# Patient Record
Sex: Male | Born: 1975 | Race: White | Hispanic: No | Marital: Married | Smoking: Former smoker
Health system: Southern US, Community
[De-identification: ages and names within clinical notes are randomized; demographics above are authoritative.]

## PROBLEM LIST (undated history)

## (undated) DIAGNOSIS — F909 Attention-deficit hyperactivity disorder, unspecified type: Secondary | ICD-10-CM

## (undated) DIAGNOSIS — IMO0002 Reserved for concepts with insufficient information to code with codable children: Principal | ICD-10-CM

## (undated) DIAGNOSIS — R202 Paresthesia of skin: Secondary | ICD-10-CM

## (undated) DIAGNOSIS — I609 Nontraumatic subarachnoid hemorrhage, unspecified: Secondary | ICD-10-CM

## (undated) DIAGNOSIS — Z8639 Personal history of other endocrine, nutritional and metabolic disease: Secondary | ICD-10-CM

## (undated) DIAGNOSIS — I10 Essential (primary) hypertension: Secondary | ICD-10-CM

## (undated) DIAGNOSIS — G4730 Sleep apnea, unspecified: Secondary | ICD-10-CM

## (undated) DIAGNOSIS — D62 Acute posthemorrhagic anemia: Secondary | ICD-10-CM

## (undated) DIAGNOSIS — S270XXA Traumatic pneumothorax, initial encounter: Secondary | ICD-10-CM

## (undated) DIAGNOSIS — E669 Obesity, unspecified: Secondary | ICD-10-CM

## (undated) DIAGNOSIS — E785 Hyperlipidemia, unspecified: Secondary | ICD-10-CM

## (undated) DIAGNOSIS — R251 Tremor, unspecified: Secondary | ICD-10-CM

## (undated) DIAGNOSIS — R159 Full incontinence of feces: Secondary | ICD-10-CM

## (undated) DIAGNOSIS — Z22322 Carrier or suspected carrier of Methicillin resistant Staphylococcus aureus: Principal | ICD-10-CM

## (undated) DIAGNOSIS — L720 Epidermal cyst: Secondary | ICD-10-CM

## (undated) DIAGNOSIS — R0600 Dyspnea, unspecified: Secondary | ICD-10-CM

## (undated) DIAGNOSIS — K219 Gastro-esophageal reflux disease without esophagitis: Secondary | ICD-10-CM

## (undated) DIAGNOSIS — R5383 Other fatigue: Principal | ICD-10-CM

## (undated) DIAGNOSIS — F418 Other specified anxiety disorders: Secondary | ICD-10-CM

## (undated) DIAGNOSIS — E538 Deficiency of other specified B group vitamins: Secondary | ICD-10-CM

## (undated) DIAGNOSIS — K625 Hemorrhage of anus and rectum: Secondary | ICD-10-CM

## (undated) DIAGNOSIS — M545 Low back pain: Secondary | ICD-10-CM

## (undated) DIAGNOSIS — Z87442 Personal history of urinary calculi: Secondary | ICD-10-CM

## (undated) DIAGNOSIS — D72829 Elevated white blood cell count, unspecified: Secondary | ICD-10-CM

## (undated) DIAGNOSIS — F329 Major depressive disorder, single episode, unspecified: Secondary | ICD-10-CM

## (undated) DIAGNOSIS — R519 Headache, unspecified: Secondary | ICD-10-CM

## (undated) DIAGNOSIS — F419 Anxiety disorder, unspecified: Secondary | ICD-10-CM

## (undated) DIAGNOSIS — G4733 Obstructive sleep apnea (adult) (pediatric): Secondary | ICD-10-CM

## (undated) HISTORY — DX: Elevated white blood cell count, unspecified: D72.829

## (undated) HISTORY — DX: Obstructive sleep apnea (adult) (pediatric): G47.33

## (undated) HISTORY — DX: Nontraumatic subarachnoid hemorrhage, unspecified: I60.9

## (undated) HISTORY — DX: Essential (primary) hypertension: I10

## (undated) HISTORY — DX: Sleep apnea, unspecified: G47.30

## (undated) HISTORY — DX: Hemorrhage of anus and rectum: K62.5

## (undated) HISTORY — DX: Other specified anxiety disorders: F41.8

## (undated) HISTORY — DX: Paresthesia of skin: R20.2

## (undated) HISTORY — DX: Deficiency of other specified B group vitamins: E53.8

## (undated) HISTORY — DX: Personal history of other endocrine, nutritional and metabolic disease: Z86.39

## (undated) HISTORY — DX: Acute posthemorrhagic anemia: D62

## (undated) HISTORY — DX: Gastro-esophageal reflux disease without esophagitis: K21.9

## (undated) HISTORY — DX: Hyperlipidemia, unspecified: E78.5

## (undated) HISTORY — DX: Other fatigue: R53.83

## (undated) HISTORY — DX: Obesity, unspecified: E66.9

## (undated) HISTORY — DX: Attention-deficit hyperactivity disorder, unspecified type: F90.9

## (undated) HISTORY — PX: LAPAROSCOPIC GASTRIC SLEEVE RESECTION: SHX5895

## (undated) HISTORY — DX: Carrier or suspected carrier of methicillin resistant Staphylococcus aureus: Z22.322

## (undated) HISTORY — DX: Traumatic pneumothorax, initial encounter: S27.0XXA

## (undated) HISTORY — DX: Epidermal cyst: L72.0

## (undated) HISTORY — DX: Tremor, unspecified: R25.1

## (undated) HISTORY — DX: Low back pain: M54.5

## (undated) HISTORY — PX: OTHER SURGICAL HISTORY: SHX169

## (undated) HISTORY — DX: Reserved for concepts with insufficient information to code with codable children: IMO0002

---
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
## 2005-03-14 ENCOUNTER — Encounter: Payer: Self-pay | Admitting: Pulmonary Disease

## 2005-03-14 ENCOUNTER — Ambulatory Visit (HOSPITAL_BASED_OUTPATIENT_CLINIC_OR_DEPARTMENT_OTHER): Admission: RE | Admit: 2005-03-14 | Discharge: 2005-03-14 | Payer: Self-pay | Admitting: Family Medicine

## 2005-03-21 ENCOUNTER — Ambulatory Visit: Payer: Self-pay | Admitting: Internal Medicine

## 2010-07-08 ENCOUNTER — Emergency Department (HOSPITAL_BASED_OUTPATIENT_CLINIC_OR_DEPARTMENT_OTHER)
Admission: EM | Admit: 2010-07-08 | Discharge: 2010-07-08 | Payer: Self-pay | Source: Home / Self Care | Admitting: Emergency Medicine

## 2010-07-08 ENCOUNTER — Ambulatory Visit
Admission: RE | Admit: 2010-07-08 | Discharge: 2010-07-08 | Payer: Self-pay | Source: Home / Self Care | Attending: Family Medicine | Admitting: Family Medicine

## 2010-07-08 DIAGNOSIS — R079 Chest pain, unspecified: Secondary | ICD-10-CM | POA: Insufficient documentation

## 2010-07-08 DIAGNOSIS — L720 Epidermal cyst: Secondary | ICD-10-CM | POA: Insufficient documentation

## 2010-07-08 DIAGNOSIS — K219 Gastro-esophageal reflux disease without esophagitis: Secondary | ICD-10-CM | POA: Insufficient documentation

## 2010-07-08 DIAGNOSIS — G4733 Obstructive sleep apnea (adult) (pediatric): Secondary | ICD-10-CM | POA: Insufficient documentation

## 2010-07-08 DIAGNOSIS — F909 Attention-deficit hyperactivity disorder, unspecified type: Secondary | ICD-10-CM | POA: Insufficient documentation

## 2010-07-08 DIAGNOSIS — R259 Unspecified abnormal involuntary movements: Secondary | ICD-10-CM | POA: Insufficient documentation

## 2010-07-08 DIAGNOSIS — M542 Cervicalgia: Secondary | ICD-10-CM | POA: Insufficient documentation

## 2010-07-08 DIAGNOSIS — IMO0002 Reserved for concepts with insufficient information to code with codable children: Secondary | ICD-10-CM | POA: Insufficient documentation

## 2010-07-08 DIAGNOSIS — I10 Essential (primary) hypertension: Secondary | ICD-10-CM | POA: Insufficient documentation

## 2010-07-08 HISTORY — DX: Obstructive sleep apnea (adult) (pediatric): G47.33

## 2010-07-08 HISTORY — DX: Epidermal cyst: L72.0

## 2010-07-13 ENCOUNTER — Ambulatory Visit: Admit: 2010-07-13 | Discharge: 2010-07-13 | Payer: Self-pay | Attending: Family Medicine | Admitting: Family Medicine

## 2010-07-13 ENCOUNTER — Encounter: Payer: Self-pay | Admitting: Family Medicine

## 2010-07-13 DIAGNOSIS — J029 Acute pharyngitis, unspecified: Secondary | ICD-10-CM | POA: Insufficient documentation

## 2010-07-14 LAB — CONVERTED CEMR LAB
AST: 20 units/L (ref 0–37)
Alkaline Phosphatase: 64 units/L (ref 39–117)
BUN: 18 mg/dL (ref 6–23)
Calcium: 9.4 mg/dL (ref 8.4–10.5)
Chloride: 105 meq/L (ref 96–112)
Creatinine, Ser: 1.04 mg/dL (ref 0.40–1.50)
HCT: 42.4 % (ref 39.0–52.0)
HDL: 28 mg/dL — ABNORMAL LOW (ref >39)
Hemoglobin: 13.9 g/dL (ref 13.0–17.0)
Indirect Bilirubin: 0.5 mg/dL (ref 0.0–0.9)
LDL Cholesterol: 118 mg/dL — ABNORMAL HIGH (ref 0–99)
MCHC: 32.8 g/dL (ref 30.0–36.0)
MCV: 85.5 fL (ref 78.0–100.0)
Platelets: 369 10*3/uL (ref 150–400)
RDW: 14.5 % (ref 11.5–15.5)
Total Protein: 7.7 g/dL (ref 6.0–8.3)
Triglycerides: 262 mg/dL — ABNORMAL HIGH (ref <150)

## 2010-07-16 NOTE — Assessment & Plan Note (Signed)
Summary: middle to lower back pain/vfw   Vital Signs:  Patient profile:   35 year old male Height:      73 inches (185.42 cm) Weight:      384 pounds (174.55 kg) BMI:     50.85 O2 Sat:      98 % on Room air Temp:     97.8 degrees F (36.56 degrees C) oral Pulse rate:   70 / minute BP sitting:   145 / 80  (right arm) Cuff size:   large  Vitals Entered By: Josph Macho RMA (July 08, 2010 12:53 PM)  O2 Flow:  Room air  Serial Vital Signs/Assessments:  Time      Position  BP       Pulse  Resp  Temp     By                     140/90                         Danise Edge MD  CC: Right Middle to lower back- pain travels down front right leg  X2 days/ CF Is Patient Diabetic? No   History of Present Illness: Patient is a 35 yo Caucasian male in today for an acute new patient visit with c/o 2 day history of persitent low back pain. He is accompanied by his wife. He reports he was jsut bending over to put his dogs water dish down when his low back locked up and he reports a sharp pain nearly took him to his knees. He denies any incontinence or abdominal pain. No numbness/tingling/weakness in legs. Pain is much more significant on right than left and he denies any history of similar injury or any recent trauma.   Upon ROS it turns out he has multiple other problems but has not had a check up or PMD for aobut 5 years. He acknowledges trouble with intermittent CP for several months. He describes the pain as sometimes pressure and some times sharp. Sometimes lasts minutes and sometimes lasts days. Lately he has been noticiing some radiation of the discomfort to his left shoulder and jaw line. He denies any worsening of diaphoresis or SOB. Does occasionally note some fluttering. He was diagnosed with Sleep apnea rougly years ago and treated with CPAP. Unfortunately he stopped using it about 4 years ago when their youngest son was born. His wife confirms he continues to snore and she witnesses  apneic episodes. Patient denies any recent illness/congestion/fevers/chills/malaise/GI or GU c/o. Does note his bowels only move every 2-3 days but no bleeding or tarry stool noted. He is concerning about an enlarging cyst on the back of his neck, he notes several years ago when it started his PMD numbed and then lanced and packed the area. A significant amount of pus was expelled and while no further pus has developed the lesion continues to slowly enlarge and is bother some. No redness, heat noted  Preventive Screening-Counseling & Management  Alcohol-Tobacco     Smoking Status: never  Caffeine-Diet-Exercise     Does Patient Exercise: no      Drug Use:  no.    Current Medications (verified): 1)  Aleve 220 Mg Tabs (Naproxen Sodium) .... 2 Q4-6 Hrs As Needed For Pain  Allergies (verified): No Known Drug Allergies  Family History: Father: 53, Obese, disassociative fugue, cleft palate, HTN, hyperlipidemia, gout, arthritis Mother: 11, cardiomyopathy, HTN, overweight Siblings:  Sister: 32,  A[employer] MGM: 19, CAD s/p multiple MIs first one in late 41s, CHF MGF: deceased in early 2s, smoker, spinal cancer, essential tremors PGM: deceased late 50s, leukemia, smoker PGF: 46, afib at 3, HTN, pacer/defibrillator Children: Daughter: 60yo, ADHD Son: 69yo, A[employer]  Social History: Occupation: works for [employer] Married Never Smoked Drug use-no Regular exercise-no 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 Obese Head:  Normocephalic and atraumatic without obvious abnormalities. No apparent alopecia or balding. Eyes:  No corneal or conjunctival inflammation noted. EOMI. Perrla.  Ears:  External ear exam shows no significant lesions or deformities.   Otoscopic examination reveals clear canals, tympanic membranes are intact bilaterally without bulging, retraction, inflammation or discharge. Hearing is grossly normal bilaterally. Nose:  External nasal examination shows no deformity or inflammation. Nasal mucosa are pink and moist without lesions or exudates. Mouth:  Oral mucosa and oropharynx without lesions or exudates.  Teeth in good repair. Neck:  No deformities or tenderness noted. Firm, NT slightly mobile lesion noted over cervical spine roughly 2 x 4 cm. No erythema or fluctuance Lungs:  Normal respiratory effort, chest expands symmetrically. Lungs are clear to auscultation, no crackles or wheezes. Heart:  Normal rate and regular rhythm. S1 and S2 normal without gallop, murmur, click, rub or other extra sounds. Abdomen:  Bowel sounds positive,abdomen soft and non-tender without masses, organomegaly or hernias noted. Msk:  No deformity or scoliosis noted of thoracic or lumbar spine.  Spasm noted in muslces fromright sacroiliac crest to lower posterior ribs, tender to palp and with limited movements. Pulses:  R and L carotid, dorsalis pedis and posterior tibial pulses are full and equal bilaterally Extremities:  No clubbing, cyanosis, edema, or deformity noted with normal full range of motion of all joints.   Neurologic:  No cranial nerve deficits noted. Station and gait are normal. Plantar reflexes are down-going bilaterally. DTRs are symmetrical throughout. Sensory, motor and coordinative functions appear intact. Skin:  Intact without suspicious lesions or rashes Cervical Nodes:  No lymphadenopathy noted Psych:  Cognition and judgment appear intact. Alert and cooperative with normal attention span and concentration. No apparent delusions, illusions, hallucinations   Impression & Recommendations:  Problem # 1:  BACK PAIN, LUMBAR, WITH RADICULOPATHY (ICD-724.4)  The following medications were removed from the medication list:    Aleve 220  Mg Tabs (Naproxen sodium) .Marland Kitchen... 2 q4-6 hrs as needed for pain His updated medication list for this problem includes:    Naprosyn 375 Mg Tabs (Naproxen) .Marland Kitchen... 1 tab by mouth two times a day as needed pain with food    Cyclobenzaprine Hcl 10 Mg Tabs (Cyclobenzaprine hcl) .Marland Kitchen... 1 tab by mouth two times a day as needed pain, can cause sedation    Bayer Aspirin Ec Low Dose 81 Mg Tbec (Aspirin) .Marland Kitchen... 1 tab by mouth daily Patient in with a 2 day history of persistent and at times severe lower back pain, r>>l. No previous history of same.  Problem # 2:  CHEST PAIN (ICD-786.50)  Orders: Cardiology Referral (Cardiology) T-Hepatic Function [phone]) T-Lipid Profile [phone]) Has been struggling with atypical CP sometimes lasting days off and on for months. Does note some recent radiation to left shoulder and jaw. Does occasionally note some fluttering also. Denies any increase in SOB, diaphoresis. With h/o apnea and obesity as well as  FH of MGM with CAD in 30s will refer for cardiology eval.  Started on ECASA 81mg  daily and given SL NTG to keep on his person at all times and advised to seek immediate care if CP does not resolve with use. 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 Consider Beta Blockade, likely Benign Essential Tremor given family history  Problem # 5:  SEBACEOUS CYST, NECK (ICD-706.2) Patient it was drained years ago and continues to grow slowly. Will consider referral for excison once his cardiac and pulmonary status have been evaluated  Problem # 6:  SLEEP APNEA  (ICD-780.57)  Orders: Cardiology Referral (Cardiology) Pulmonary Referral (Pulmonary) T-CBC No Diff ([id number]) Diagnosed with sleep apnea and using CPAP roughly 6 years ago but unfortunately stopped using the CPAP 4 years ago when his youngest child was born. His wife reports persistent snoring and witnesses apneic episodes  Problem # 7:  GERD (ICD-530.81)  His updated medication list for this problem includes:    Ranitidine Hcl 150 Mg Caps (Ranitidine hcl) .Marland Kitchen... 1 tab by mouth daily Avoid offending foods, reduce size of meals and do not eat too close to bedtime.  Complete Medication List: 1)  Naprosyn 375 Mg Tabs (Naproxen) .Marland Kitchen.. 1 tab by mouth two times a day as needed pain with food 2)  Cyclobenzaprine Hcl 10 Mg Tabs (Cyclobenzaprine hcl) .Marland Kitchen.. 1 tab by mouth two times a day as needed pain, can cause sedation 3)  Nitrostat 0.4 Mg Subl (Nitroglycerin) .Marland Kitchen.. 1 tab sl as needed cp, may repeat q 5 minutes x 3 doses, if no resolution to er for eval 4)  Bayer Aspirin Ec Low Dose 81 Mg Tbec (Aspirin) .Marland Kitchen.. 1 tab by mouth daily 5)  Ranitidine Hcl 150 Mg Caps (Ranitidine hcl) .Marland Kitchen.. 1 tab by mouth daily  Other Orders: Flu Vaccine 6-35 months (09811) Admin 1st Vaccine (91478) T-Basic Metabolic Panel [phone]) T-TSH [id number])  Patient Instructions: 1)  Please schedule a follow-up appointment in 2 to 4 weeks.  2)  Avoid foods high in acid(tomatoes, citrus juices,spicy foods).Avoid eating within two hours of lying down or before exercising. Do not over eat: try smaller more frequent meals. Elevate head of bed twelve inches when sleeping.  3)  Take 650 - 1000 mg of tylenol every 6 hours as needed for relief of pain or comfort of fever. Avoid taking more than 3000 mg in a 24 hour period( can cause liver damage in higher doses).  4)  Most patients (90%) with low back pain will improve with time ( 2-6 weeks). Keep active but avoid activities that are painful. Apply moist heat and/or ice to  lower back several times a day.  5)  May apply moist heat then gentle stretching two times a day for low back. 6)  No new strenuous work out routine until cardiac evaluation complete Prescriptions: RANITIDINE HCL 150 MG CAPS (RANITIDINE HCL) 1 tab by mouth daily  #30 x 2   Entered and Authorized by:   Danise Edge MD   Signed by:   Danise Edge MD on 07/08/2010   Method used:   Electronically to  [facility] [address] (retail)       [address]       [place], [place]  [address]       Ph: [phone]       Fax: [phone]   RxID:   [id number] NITROSTAT 0.4 MG SUBL (NITROGLYCERIN) 1 tab sl as needed CP, may repeat q 5 minutes x 3 doses, if no resolution to ER for eval  #25 x 1   Entered and Authorized by:   Danise Edge MD   Signed by:   Danise Edge MD on 07/08/2010   Method used:   Electronically to        [facility] [address] (retail)       [address]       [place], [place]  [address]       Ph: [phone]       Fax: [phone]   RxID:   [id number] CYCLOBENZAPRINE HCL 10 MG TABS (CYCLOBENZAPRINE HCL) 1 tab by mouth two times a day as needed pain, can cause sedation  #40 x 1   Entered and Authorized by:   Danise Edge MD   Signed by:   Danise Edge MD on 07/08/2010   Method used:   Electronically to        [facility] [address] (retail)       [address]       [place], [place]  [address]       Ph: [phone]       Fax: [phone]   RxID:   [id number] NAPROSYN 375 MG TABS (NAPROXEN) 1 tab by mouth two times a day as needed pain with food  #40 x 1   Entered and Authorized by:   Danise Edge MD   Signed by:   Danise Edge MD on 07/08/2010   Method used:   Electronically to        [facility] [address] (retail)       [address]       [place], [place]  [address]       Ph: [phone]       Fax: [phone]   RxID:   [id number]    Orders Added: 1)  Flu Vaccine 6-35 months  [[address]] 2)  Admin 1st Vaccine [90471] 3)  Cardiology Referral [Cardiology] 4)  Pulmonary Referral [Pulmonary] 5)  T-Basic Metabolic Panel [[id number]] 6)  T-Hepatic Function [80076-22960] 7)  T-Lipid Profile [80061-22930] 8)  T-CBC No Diff [[address]] 9)  T-TSH [[id number]] 10)  New Patient Level IV [51025]   Immunizations Administered:  Influenza Vaccine # 1:    Vaccine Type: Fluvax 6-77mos    Site: left deltoid    Mfr: Sanofi Pasteur    Dose: 0.5 ml    Route: IM    Given by: Josph Macho RMA    Exp. Date: 12/12/2010    Lot #: [id number]    VIS given: 01/06/10 version given July 08, 2010.   Immunizations Administered:  Influenza Vaccine # 1:    Vaccine Type: Fluvax 6-12mos    Site: left deltoid    Mfr: Sanofi Pasteur    Dose: 0.5 ml    Route: IM    Given by: Josph Macho RMA    Exp. Date: 12/12/2010  Lot #: [id number]    VIS given: 01/06/10 version given July 08, 2010.

## 2010-07-22 NOTE — Assessment & Plan Note (Signed)
Summary: SORE THROAT, CONGESTION//SP   Vital Signs:  Patient profile:   35 year old male Height:      73 inches Weight:      376.50 pounds BMI:     49.85 Temp:     96.4 degrees F temporal Pulse rate:   84 / minute BP sitting:   128 / 93  (right arm) Cuff size:   large  Vitals Entered By: Francee Piccolo CMA Duncan Dull) (July 13, 2010 9:02 AM) CC: ear ache, sore throat, feels like drainage...pt does not feel any chest congestion Is Patient Diabetic? No   History of Present Illness: 35 y/o WM, onset of ST--mainly left sided-- about 4d ago, with slight nasal mucous/PND but no cough, fever, or rash.  Also has left ear pain.  Dealing with acute LBP lately, went to [facility] shortly after being seen here for LBP several days ago, was given a shot of something per his report, also rx for vicodin. Missed work 07/10/09 due to malaise with his ST. ROS: no joint aches except recent LBP.  No muscle aches except LBP. No eye swelling or drainage.  No focal oral lesions, no neck pain.  Current Medications (verified): 1)  Naprosyn 375 Mg Tabs (Naproxen) .Marland Kitchen.. 1 Tab By Mouth Two Times A Day As Needed Pain With Food 2)  Cyclobenzaprine Hcl 10 Mg Tabs (Cyclobenzaprine Hcl) .Marland Kitchen.. 1 Tab By Mouth Two Times A Day As Needed Pain, Can Cause Sedation 3)  Nitrostat 0.4 Mg Subl (Nitroglycerin) .Marland Kitchen.. 1 Tab Sl As Needed Cp, May Repeat Q 5 Minutes X 3 Doses, If No Resolution To Er For Eval 4)  Bayer Aspirin Ec Low Dose 81 Mg Tbec (Aspirin) .Marland Kitchen.. 1 Tab By Mouth Daily 5)  Ranitidine Hcl 150 Mg Caps (Ranitidine Hcl) .Marland Kitchen.. 1 Tab By Mouth Daily 6)  Aleve 220 Mg Tabs (Naproxen Sodium) .... Take 2 Tablets  Allergies (verified): No Known Drug Allergies  Past History:  Past Medical History: GERD Adult ADD OSA Morbid obesity  Past Surgical History: NONE  Social History: Reviewed history from 07/08/2010 and no changes required. 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 Gen: alert, NAD, NONTOXIC. HEENT: eyes without injection, drainage, or swelling.  Ears: EACs clear, TMs with normal light reflex and landmarks.  Nose: some dried, crusty exudate adherent to some mildly injected mucosa.  No purulent d/c.  No paranasal sinus TTP.  No facial swelling.  Throat and mouth without focal lesion.  No pharyngial swelling, erythema, or exudate.   Neck: supple, no LAD.  LUNGS: CTA bilat, nonlabored resps.  CV: RRR, no m/r/g.      Impression & Recommendations:  Problem # 1:  PHARYNGITIS, VIRAL (ICD-462) Assessment New Rapid strep negative today.  His illness is definitely more consistent with viral URI/pharyngitis, with referred left ear pain. Self limited nature of illness discussed.  Warning signs discussed. Symptomatic care discussed, note given for missed day of work 07/10/10.  His updated medication list for this problem includes:    Naprosyn 375 Mg Tabs (Naproxen) .Marland Kitchen... 1 tab by mouth two times a day as needed pain with food    Bayer Aspirin Ec Low Dose 81 Mg Tbec (Aspirin) .Marland Kitchen... 1 tab by mouth daily    Aleve 220 Mg Tabs (Naproxen sodium) .Marland Kitchen... Take 2  tablets  Orders: Rapid Strep (04540)  Complete Medication List: 1)  Naprosyn 375 Mg Tabs (Naproxen) .Marland Kitchen.. 1 tab by mouth two times a day as needed pain with food 2)  Cyclobenzaprine Hcl 10 Mg Tabs (Cyclobenzaprine hcl) .Marland Kitchen.. 1 tab by mouth two times a day as needed pain, can cause sedation 3)  Nitrostat 0.4 Mg Subl (Nitroglycerin) .Marland Kitchen.. 1 tab sl as needed cp, may repeat q 5 minutes x 3 doses, if no resolution to er for eval 4)  Bayer Aspirin Ec Low Dose 81 Mg Tbec (Aspirin) .Marland Kitchen.. 1 tab by mouth daily 5)  Ranitidine Hcl 150 Mg Caps (Ranitidine hcl) .Marland Kitchen.. 1 tab by mouth daily 6)  Aleve 220 Mg Tabs (Naproxen sodium) .... Take 2 tablets  Patient Instructions: 1)   Please schedule a follow-up appointment as needed.    Orders Added: 1)  Est. Patient Level III [98119] 2)  Rapid Strep [[address]782]

## 2010-07-24 ENCOUNTER — Encounter: Payer: Self-pay | Admitting: *Deleted

## 2010-07-29 ENCOUNTER — Encounter: Payer: Self-pay | Admitting: Cardiology

## 2010-07-29 ENCOUNTER — Institutional Professional Consult (permissible substitution) (INDEPENDENT_AMBULATORY_CARE_PROVIDER_SITE_OTHER): Payer: Commercial Indemnity | Admitting: Cardiology

## 2010-07-29 DIAGNOSIS — R0989 Other specified symptoms and signs involving the circulatory and respiratory systems: Secondary | ICD-10-CM

## 2010-07-29 DIAGNOSIS — R0602 Shortness of breath: Secondary | ICD-10-CM | POA: Insufficient documentation

## 2010-07-29 DIAGNOSIS — R072 Precordial pain: Secondary | ICD-10-CM

## 2010-07-30 ENCOUNTER — Institutional Professional Consult (permissible substitution) (INDEPENDENT_AMBULATORY_CARE_PROVIDER_SITE_OTHER): Payer: Commercial Indemnity | Admitting: Pulmonary Disease

## 2010-07-30 ENCOUNTER — Encounter: Payer: Self-pay | Admitting: Pulmonary Disease

## 2010-07-30 DIAGNOSIS — G4730 Sleep apnea, unspecified: Secondary | ICD-10-CM

## 2010-08-05 NOTE — Assessment & Plan Note (Signed)
Summary: chest pains/ pt have [employer]/per Diane [phone] notes in EMR-mb   Visit Type:  Initial Consult Primary Provider:  Danise Edge MD  CC:  Chest pains and High blood pressure.  History of Present Illness: 35 year old male with no prior cardiac history for evaluation of chest pain. Patient has had intermittent chest pain for approximately one year. The pain is substernal and described as an ache. There is an occasional sharp shooting pain as well. The pain does not radiate. There is no associated nausea, vomiting, diaphoresis there is occasional mild dyspnea. The pain is not pleuritic or positional. It occurs either with exertion or at rest. It lasts hours at a time and resolved spontaneously. It can increase with stress. He also has some dyspnea on exertion but there is no orthopnea, PND or pedal edema. Because of the above we were asked to further evaluate.  Current Medications (verified): 1)  Naprosyn 375 Mg Tabs (Naproxen) .Marland Kitchen.. 1 Tab By Mouth Two Times A Day As Needed Pain With Food 2)  Cyclobenzaprine Hcl 10 Mg Tabs (Cyclobenzaprine Hcl) .Marland Kitchen.. 1 Tab By Mouth Two Times A Day As Needed Pain, Can Cause Sedation 3)  Nitrostat 0.4 Mg Subl (Nitroglycerin) .Marland Kitchen.. 1 Tab Sl As Needed Cp, May Repeat Q 5 Minutes X 3 Doses, If No Resolution To Er For Eval 4)  Bayer Aspirin Ec Low Dose 81 Mg Tbec (Aspirin) .Marland Kitchen.. 1 Tab By Mouth Daily 5)  Ranitidine Hcl 150 Mg Caps (Ranitidine Hcl) .Marland Kitchen.. 1 Tab By Mouth Daily 6)  Aleve 220 Mg Tabs (Naproxen Sodium) .... Take 2 Tablets As Needed 7)  Levothyroxine Sodium 25 Mcg Tabs (Levothyroxine Sodium) .... Once Daily 8)  Fish Oil 1000 Mg Caps (Omega-3 Fatty Acids) .... Take 1 Capsule By Mouth Two Times A Day  Allergies (verified): No Known Drug Allergies  Past History:  Past Medical History: HYPERTENSION HYPERLIPIDEMIA HYPOTHYROID ATTENTION DEFICIT HYPERACTIVITY DISORDER, ADULT  GERD  IDIOPATHIC TREMOR  MORBID OBESITY BACK PAIN, LUMBAR, WITH  RADICULOPATHY OSA  Past Surgical History: Reviewed history from 07/13/2010 and no changes required. NONE  Family History: Reviewed history from 07/08/2010 and no changes required. Father: 11, Obese, disassociative fugue, cleft palate, HTN, hyperlipidemia, gout, arthritis Mother: 12, cardiomyopathy, HTN, overweight Siblings:  Sister: 40, [employer] MGM: 57, CAD s/p multiple MIs first one in late 57s, CHF MGF: deceased in early 8s, smoker, spinal cancer, essential tremors PGM: deceased late 51s, leukemia, smoker PGF: 79, afib at 50, HTN, pacer/defibrillator Children: Daughter: 84yo, ADHD Son: 26yo, [employer]  Social History: Reviewed history from 07/08/2010 and no changes required. 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 Remaining systems are negative.   Vital Signs:  Patient profile:   35 year old male Height:      73 inches Weight:      377.50 pounds BMI:     49.99 Pulse rate:   72 / minute Pulse rhythm:   regular Resp:     18 per minute BP sitting:   118 / 80  (left arm) Cuff size:   large  Vitals Entered By: Vikki Ports (July 29, 2010 8:46 AM)  Physical Exam  General:  Well developed/obese in NAD Skin warm/dry Patient not depressed No peripheral clubbing  Back-normal HEENT-normal/normal eyelids Neck supple/normal carotid upstroke bilaterally; no bruits; no JVD; no thyromegaly chest - CTA/ normal expansion CV - RRR/normal S1 and S2; no murmurs, rubs or gallops;  PMI nondisplaced Abdomen -Difficult due to obesity; NT/ND, no HSM, no mass, + bowel sounds, no bruit 2+ femoral pulses, no bruits Ext-no edema, chords, 2+  DP Neuro-grossly nonfocal     EKG  Procedure date:  07/29/2010  Findings:      Sinus Rhythm with No ST Changes.  Impression & Recommendations:  Problem # 1:  CHEST PAIN (ICD-786.50) Symptoms atypical. Schedule treadmill for risk stratification. His updated medication list for this problem includes:    Nitrostat 0.4 Mg Subl (Nitroglycerin) .Marland Kitchen... 1 tab sl as needed cp, may repeat q 5 minutes x 3 doses, if no resolution to er for eval    Bayer Aspirin Ec Low Dose 81 Mg Tbec (Aspirin) .Marland Kitchen... 1 tab by mouth daily  His updated medication list for this problem includes:    Nitrostat 0.4 Mg Subl (Nitroglycerin) .Marland Kitchen... 1 tab sl as needed cp, may repeat q 5 minutes x 3 doses, if no resolution to er for eval    Bayer Aspirin Ec Low Dose 81 Mg Tbec (Aspirin) .Marland Kitchen... 1 tab by mouth daily  Orders: Treadmill (Treadmill) Echocardiogram (Echo)  Problem # 2:  MORBID OBESITY (ICD-278.01) Pt given information for nutrition referral. Orders: Nutrition Referral (Nutrition)  Problem # 3:  DYSPNEA (ICD-786.05) Schedule echocardiogram to quantify LV function. His updated medication list for this problem includes:    Bayer Aspirin Ec Low Dose 81 Mg Tbec (Aspirin) .Marland Kitchen... 1 tab by mouth daily  Problem # 4:  ELEVATED BP READING WITHOUT DX HYPERTENSION (ICD-796.2) Blood pressure normal today.  Problem # 5:  SLEEP APNEA (ICD-780.57)  Patient Instructions: 1)  Your physician has requested that you have an echocardiogram.  Echocardiography is a painless test that uses sound waves to create images of your heart. It provides your doctor with information about the size and shape of your heart and how well your heart's chambers and valves are working.  This procedure takes approximately one hour. There are no restrictions for this procedure. 2)  Your physician has requested that you have an exercise tolerance test.  For further information please visit [email or web address]  Please also follow instruction sheet,  as given.

## 2010-08-06 ENCOUNTER — Telehealth: Payer: Self-pay | Admitting: Pulmonary Disease

## 2010-08-11 ENCOUNTER — Encounter (INDEPENDENT_AMBULATORY_CARE_PROVIDER_SITE_OTHER): Payer: Commercial Indemnity | Admitting: Physician Assistant

## 2010-08-11 ENCOUNTER — Encounter: Payer: Self-pay | Admitting: Physician Assistant

## 2010-08-11 ENCOUNTER — Ambulatory Visit (HOSPITAL_COMMUNITY): Payer: Commercial Indemnity | Attending: Cardiology

## 2010-08-11 DIAGNOSIS — R079 Chest pain, unspecified: Secondary | ICD-10-CM

## 2010-08-11 DIAGNOSIS — R0989 Other specified symptoms and signs involving the circulatory and respiratory systems: Secondary | ICD-10-CM | POA: Insufficient documentation

## 2010-08-11 DIAGNOSIS — R0609 Other forms of dyspnea: Secondary | ICD-10-CM | POA: Insufficient documentation

## 2010-08-11 NOTE — Progress Notes (Signed)
Summary: [facility] calling re: cpap  Phone Note From Other Clinic   Caller: betsy w/ [facility] Call For: alva Summary of Call: caller states that per pt request she is cancelling the order for cpap auto since pt is going to use another DME (she just spoke to pt and was told that rhonda was taking care of this for him). betsy's # [phone] Initial call taken by: Tivis Ringer, CNA,  August 06, 2010 1:32 PM  Follow-up for Phone Call        Called and spoke with Christoper Allegra and advised once again, that a D/C order was faxed to [facility] branch per pt request yesterday. Then the manager of this branch, Okey Regal, called yesterday to verifiy that pt was getting service by another in network provider. Libby spoke with Okey Regal and advised her that the pt had decided to go to another DME company (with in network benefits). Advised that this was the third attempt to D/C this order with [facility]. She stated that she would make a  note of this and would not call again. Rhonda Cobb  August 06, 2010 1:47 PM

## 2010-08-11 NOTE — Assessment & Plan Note (Signed)
Summary: sleep consult//sh   Visit Type:  Initial Consult Primary Provider/Referring Provider:  Danise Edge MD  CC:   sleep apnea--snoring--very restless and tired during the day---has used cpap in the past but stopped --has had sleep study done back in 2007? at [facility] long.  History of Present Illness: 35/M, obese for evaluation & management  of obstructive sleep apnea  PSG in 2006 , wt 332 lbs showed AHI of 72/h , nadir desatn 76%corrected by 14 cm , med fullf ace mask Used it for a few weeks, stopped due to mask leak, forehead indentation 'I could see the benefits' Now c/o excessive daytime somnolence & snoring Epworth Sleepiness Score 12/24 side & stomach sleeper , clearly worse on his back oob at 0600, tired & groggy, an hour before he gets going 60 oz soda / day, has nutrition referral , Has gained 50 lbs over last 5 yrs There is no history suggestive of cataplexy, sleep paralysis or parasomnias   Preventive Screening-Counseling & Management  Alcohol-Tobacco     Smoking Status: never  Caffeine-Diet-Exercise     Does Patient Exercise: no   History of Present Illness: apnea, snoring, restlessness  What time do you typically go to bed?(between what hours): 10:30-12 am  How long does it take you to fall asleep? not long   4-10 mins  How many times during the night do you wake up? numerous i am told  What time do you get out of bed to start your day? too early--6:10 am  Do you drive or operate heavy machinery in your occupation? no  How much has your weight changed (up or down) over the past two years? (in pounds): stable  Have you ever had a sleep study before?  If yes,when and where: yes [place]  Do you currently use CPAP ? If so , at what pressure? no  Do you wear oxygen at any time? If yes, how many liters per minute? no Allergies (verified): No Known Drug Allergies  Past History:  Past Medical History: Last updated:  07/29/2010 HYPERTENSION HYPERLIPIDEMIA HYPOTHYROID ATTENTION DEFICIT HYPERACTIVITY DISORDER, ADULT  GERD  IDIOPATHIC TREMOR  MORBID OBESITY BACK PAIN, LUMBAR, WITH RADICULOPATHY OSA  Family History: Last updated: 07/08/2010 Father: 27, Obese, disassociative fugue, cleft palate, HTN, hyperlipidemia, gout, arthritis Mother: 69, cardiomyopathy, HTN, overweight Siblings:  Sister: 28, A&W MGM: 19, CAD s/p multiple MIs first one in late 15s, CHF MGF: deceased in early 71s, smoker, spinal cancer, essential tremors PGM: deceased late 32s, leukemia, smoker PGF: 70, afib at 25, HTN, pacer/defibrillator Children: Daughter: 54yo, ADHD Son: 72yo, A&W  Social History: Last updated: 07/29/2010 Occupation: works for [employer] Married Never Smoked Drug use-no Regular exercise-no Coaches youth soccer No dietary restrictions uses seat belt Alcohol Use - yes  Review of Systems  The patient denies anorexia, fever, weight loss, weight gain, vision loss, decreased hearing, hoarseness, chest pain, syncope, dyspnea on exertion, peripheral edema, prolonged cough, headaches, hemoptysis, abdominal pain, melena, hematochezia, severe indigestion/heartburn, hematuria, muscle weakness, suspicious skin lesions, transient blindness, difficulty walking, depression, unusual weight change, abnormal bleeding, enlarged lymph nodes, and angioedema.    Vital Signs:  Patient profile:   35 year old male Height:      73 inches Weight:      383.38 pounds BMI:     50.76 O2 Sat:      99 % on Room air Temp:     97.7 degrees F oral Pulse rate:   83 / minute BP sitting:  136 / 88  (left arm) Cuff size:   large  Vitals Entered By: Randell Loop CMA (July 30, 2010 3:12 PM)  O2 Sat at Rest %:  99 O2 Flow:  Room air CC:  sleep apnea--snoring--very restless and tired during the day---has used cpap in the past but stopped --has had sleep study done back in 2007? at [facility] long Is Patient Diabetic? No Pain  Assessment Patient in pain? no      Comments meds updated today with pt pt brought his meds today   Physical Exam  Additional Exam:  Gen. Pleasant, obese, in no distress, normal affect ENT - no lesions, no post nasal drip, class 3 airway  Neck: No JVD, no thyromegaly, no carotid bruits Lungs: no use of accessory muscles, no dullness to percussion, clear without rales or rhonchi  Cardiovascular: Rhythm regular, heart sounds  normal, no murmurs or gallops, no peripheral edema Abdomen: soft and non-tender, no hepatosplenomegaly, BS normal. Musculoskeletal: No deformities, no cyanosis or clubbing Neuro:  alert, non focal     Impression & Recommendations:  Problem # 1:  SLEEP APNEA (ICD-780.57) The pathophysiology of obstructive sleep apnea, it's cardiovascular consequences and modes of treatment including CPAP were discussed with the patient in great detail.  Will initiate autoCPAP 10-20 cm , humidity, full face mask, download in 4 weeksfor leak, compliance etc  & change to fixed pressure Discussed common adjustment issues  Orders: Consultation Level IV (91478) DME Referral (DME)  Problem # 2:  MORBID OBESITY (ICD-278.01) weight loss emphasised Diet & nutrition program  Patient Instructions: 1)  Copy sent to: Dr Rogelia Rohrer 2)  Please schedule a follow-up appointment in 1 month. 3)  Turn in the card in 4 weeks so we can look at data 4)  Go over to the sleep lab for  a full face mask fit  - [phone]

## 2010-08-14 ENCOUNTER — Encounter: Payer: Self-pay | Admitting: Pulmonary Disease

## 2010-08-19 ENCOUNTER — Encounter: Payer: Self-pay | Admitting: Family Medicine

## 2010-08-28 ENCOUNTER — Encounter: Payer: Self-pay | Admitting: Pulmonary Disease

## 2010-08-28 ENCOUNTER — Ambulatory Visit (INDEPENDENT_AMBULATORY_CARE_PROVIDER_SITE_OTHER): Payer: Commercial Indemnity | Admitting: Pulmonary Disease

## 2010-08-28 DIAGNOSIS — G4730 Sleep apnea, unspecified: Secondary | ICD-10-CM

## 2010-09-01 NOTE — Assessment & Plan Note (Signed)
Summary: 1 MONTH ROV//SH   Visit Type:  Follow-up Primary Provider/Referring Provider:  Danise Edge MD  CC:  1 month sleep follow up.  History of Present Illness: 34/M, obese for evaluation & management  of obstructive sleep apnea  PSG in 2006 , wt 332 lbs showed AHI of 72/h , nadir desatn 76%corrected by 14 cm , med fullf ace mask Used it for a few weeks, stopped due to mask leak, forehead indentation Now c/o excessive daytime somnolence & snoring Epworth Sleepiness Score 12/24 Has gained 50 lbs over last 5 yrs  August 28, 2010 3:41 PM  smaller mask that fits beter inspite of facial hair took off ramp, pressure ok, more energy, sound sleep, able to turn , no snoring per wife No sodas, reading labels, portion control There is no history suggestive of cataplexy, sleep paralysis or parasomnias   Preventive Screening-Counseling & Management  Alcohol-Tobacco     Smoking Status: never  Current Medications (verified): 1)  Nitrostat 0.4 Mg Subl (Nitroglycerin) .Marland Kitchen.. 1 Tab Sl As Needed Cp, May Repeat Q 5 Minutes X 3 Doses, If No Resolution To Er For Eval 2)  Bayer Aspirin Ec Low Dose 81 Mg Tbec (Aspirin) .Marland Kitchen.. 1 Tab By Mouth Daily 3)  Ranitidine Hcl 150 Mg Caps (Ranitidine Hcl) .Marland Kitchen.. 1 Tab By Mouth Daily 4)  Aleve 220 Mg Tabs (Naproxen Sodium) .... Take 2 Tablets As Needed 5)  Levothyroxine Sodium 25 Mcg Tabs (Levothyroxine Sodium) .... Once Daily 6)  Fish Oil 1000 Mg Caps (Omega-3 Fatty Acids) .... Take 1 Capsule By Mouth Two Times A Day  Allergies (verified): No Known Drug Allergies  Past History:  Past Medical History: Last updated: 07/29/2010 HYPERTENSION HYPERLIPIDEMIA HYPOTHYROID ATTENTION DEFICIT HYPERACTIVITY DISORDER, ADULT  GERD  IDIOPATHIC TREMOR  MORBID OBESITY BACK PAIN, LUMBAR, WITH RADICULOPATHY OSA  Social History: Last updated: 07/29/2010 Occupation: works for [employer] Married Never Smoked Drug use-no Regular exercise-no Coaches youth soccer No  dietary restrictions uses seat belt Alcohol Use - yes  Review of Systems  The patient denies anorexia, fever, weight loss, weight gain, vision loss, decreased hearing, hoarseness, chest pain, syncope, dyspnea on exertion, peripheral edema, prolonged cough, headaches, hemoptysis, abdominal pain, melena, hematochezia, severe indigestion/heartburn, hematuria, muscle weakness, suspicious skin lesions, transient blindness, difficulty walking, depression, unusual weight change, abnormal bleeding, and enlarged lymph nodes.    Vital Signs:  Patient profile:   35 year old male Height:      73 inches Weight:      378.6 pounds BMI:     50.13 O2 Sat:      98 % on Room air Temp:     98.2 degrees F oral Pulse rate:   72 / minute BP sitting:   120 / 78  (left arm) Cuff size:   large  Vitals Entered By: Zackery Barefoot CMA (August 28, 2010 3:30 PM)  O2 Flow:  Room air CC: 1 month sleep follow up Comments Medications reviewed with patient Verified contact number and pharmacy with patient Zackery Barefoot CMA  August 28, 2010 3:31 PM    Physical Exam  Additional Exam:  Gen. Pleasant, obese, in no distress, normal affect ENT - no lesions, no post nasal drip, class 3 airway  Neck: No JVD, no thyromegaly, no carotid bruits Lungs: no use of accessory muscles, no dullness to percussion, clear without rales or rhonchi  Cardiovascular: Rhythm regular, heart sounds  normal, no murmurs or gallops, no peripheral edema Musculoskeletal: No deformities, no cyanosis or clubbing  Impression & Recommendations:  Problem # 1:  SLEEP APNEA (ICD-780.57)  Obain download on auto 1-20 settings & change to fixed pressure He seems to have adjusted well this time & is compliant by report. Compliance encouraged, wt loss emphasized, asked to avoid meds with sedative side effects, cautioned against driving when sleepy.    Orders: Est. Patient Level III (47829)  Patient Instructions: 1)  Please schedule a  follow-up appointment in 2 months. 2)  Copy sent to: 3)  Rules of sleep hygiene were discussed  4)  - light exercise 5)  -avoid caffeinated beverages 6)  - fixed bedtime 7)  - No TV or computer usage at bedtime.

## 2010-09-24 ENCOUNTER — Other Ambulatory Visit: Payer: Self-pay | Admitting: Family Medicine

## 2010-10-05 ENCOUNTER — Telehealth: Payer: Self-pay | Admitting: Pulmonary Disease

## 2010-10-05 DIAGNOSIS — G4730 Sleep apnea, unspecified: Secondary | ICD-10-CM

## 2010-10-05 NOTE — Telephone Encounter (Signed)
Called spoke with patient who states that the auto does not seem to supply enough constant pressure esp with inhaling.  Has been on the auto x1 week.  Reports wife has told him that he's been snoring.  reqesting to be changed back to set pressure.  Pt uses [facility].  Dr. Vassie Loll please advise, thanks.

## 2010-10-05 NOTE — Telephone Encounter (Signed)
Can we get download of 1 wk info from dme pl ?

## 2010-10-07 ENCOUNTER — Encounter: Payer: Self-pay | Admitting: Pulmonary Disease

## 2010-10-12 NOTE — Telephone Encounter (Signed)
Order sent for download.

## 2010-10-24 ENCOUNTER — Telehealth: Payer: Self-pay | Admitting: Pulmonary Disease

## 2010-10-24 DIAGNOSIS — G4733 Obstructive sleep apnea (adult) (pediatric): Secondary | ICD-10-CM

## 2010-10-24 DIAGNOSIS — Z9989 Dependence on other enabling machines and devices: Secondary | ICD-10-CM

## 2010-10-24 NOTE — Telephone Encounter (Signed)
Download on auto shows avg pr of 14 cm, good control of events & no leak Change back to fixed pressure

## 2010-11-02 NOTE — Telephone Encounter (Signed)
I informed pt of RA's findings and recommendations. Pt verbalized understanding  

## 2010-11-02 NOTE — Telephone Encounter (Signed)
lmomtcb x1 

## 2010-11-05 ENCOUNTER — Encounter: Payer: Self-pay | Admitting: Pulmonary Disease

## 2010-11-10 ENCOUNTER — Encounter: Payer: Self-pay | Admitting: Pulmonary Disease

## 2010-11-17 ENCOUNTER — Telehealth: Payer: Self-pay | Admitting: *Deleted

## 2010-11-17 NOTE — Telephone Encounter (Signed)
Per Dr. Vassie Loll mailed OV notes from 08-28-2010 to [place] [facility] [address], [place]. [id number], [place] [address].

## 2010-12-03 ENCOUNTER — Encounter: Payer: Self-pay | Admitting: Pulmonary Disease

## 2010-12-23 ENCOUNTER — Telehealth: Payer: Self-pay | Admitting: Pulmonary Disease

## 2010-12-23 NOTE — Telephone Encounter (Signed)
Faxed 2006 Sleep study and consult note from 07/2010 to pt @ [phone] per pt's request. Pt aware.

## 2010-12-28 ENCOUNTER — Ambulatory Visit (INDEPENDENT_AMBULATORY_CARE_PROVIDER_SITE_OTHER): Payer: Commercial Indemnity | Admitting: Family Medicine

## 2010-12-28 ENCOUNTER — Encounter: Payer: Self-pay | Admitting: Family Medicine

## 2010-12-28 VITALS — BP 140/87 | HR 73 | Temp 97.9°F | Ht 73.0 in | Wt 376.4 lb

## 2010-12-28 DIAGNOSIS — E785 Hyperlipidemia, unspecified: Secondary | ICD-10-CM | POA: Insufficient documentation

## 2010-12-28 DIAGNOSIS — G4730 Sleep apnea, unspecified: Secondary | ICD-10-CM

## 2010-12-28 DIAGNOSIS — E7800 Pure hypercholesterolemia, unspecified: Secondary | ICD-10-CM

## 2010-12-28 DIAGNOSIS — R079 Chest pain, unspecified: Secondary | ICD-10-CM

## 2010-12-28 DIAGNOSIS — E039 Hypothyroidism, unspecified: Secondary | ICD-10-CM

## 2010-12-28 DIAGNOSIS — R030 Elevated blood-pressure reading, without diagnosis of hypertension: Secondary | ICD-10-CM

## 2010-12-28 DIAGNOSIS — E669 Obesity, unspecified: Secondary | ICD-10-CM

## 2010-12-28 DIAGNOSIS — K219 Gastro-esophageal reflux disease without esophagitis: Secondary | ICD-10-CM

## 2010-12-28 NOTE — Assessment & Plan Note (Signed)
Has lost weight since his last visit. He has cut out also lists and is trying to make better food choices but he continues to be eat big portions. Encouraged to to try the DASH diet given handouts today. Is referred to Nutritionist and encouraged to avoid trans fats.

## 2010-12-28 NOTE — Assessment & Plan Note (Signed)
Following with [facility] and using [facility] for his CPAP machine, reports he is using it consistently and feels better and more rested since starting therapy.

## 2010-12-28 NOTE — Patient Instructions (Signed)
Obesity Obesity is defined as having a Body Mass Index (BMI) of 30 or more. To calculate your BMI divide your weight in pounds by your height in inches squared and multiply that product by 703. Major illnesses resulting from long-term obesity include:  Stroke.   Heart disease.   Diabetes.   Many cancers.   Arthritis.  Obesity also complicates recovery from many other medical problems.  CAUSES  A history of obesity in your parents.   Thyroid hormone imbalance.   Environmental factors such as excess calorie intake and physical inactivity.  TREATMENT A healthy weight loss program includes:  A calorie restricted diet based on individual calorie needs.   Increased physical activity (exercise).  An exercise program is just as important as the right low calorie diet.  Weight-loss medicines should be used only under the supervision of your physician. These medicines help, but only if they are used with diet and exercise programs. Medicines can have side effects including nervousness, nausea, abdominal pain, diarrhea, headache, drowsiness, and depression.  An unhealthy weight loss program includes:  Fasting.   Fad diets.   Supplements and drugs.  These choices do not succeed in long-term weight control.  HOME CARE INSTRUCTIONS To help you make the needed dietary changes:   Keep a daily record of everything you eat. There are many free websites to help you with this. It may be helpful to measure your foods so you can determine if you are eating the correct portion sizes.   Use low-calorie cookbooks or take special cooking classes.   Avoid alcohol. Drink more water and drinks with no calories.   Take vitamins and supplements only as recommended by your caregiver.   Weight-loss support groups, Registered Dieticians, counselors, and stress reduction education can also be very helpful.  PREVENTION Losing weight and keeping it off takes time, discipline, a healthy diet and regular  exercise. Document Released: 07/08/2004 Document Re-Released: 06/20/2007 Exit[employer] Patient Information 2011 [employer]. 

## 2010-12-29 LAB — T4, FREE: Free T4: 1.12 ng/dL (ref 0.80–1.80)

## 2010-12-29 LAB — TSH: TSH: 3.185 u[IU]/mL (ref 0.350–4.500)

## 2010-12-29 NOTE — Progress Notes (Signed)
Bobby [name] [id number] May 19, 1976 12/29/2010      Progress Note-Follow Up  Subjective  Chief Complaint  Chief Complaint  Patient presents with  . Follow-up    on medication    HPI  Patient is a 35 year old male in today for followup of multiple medical problems. His origin the patient when he had many complaints. He had complaints of fatigue, shortness of breath, chest pain, back pain, heartburn. He is significantly better at this point. His cardiology workup did not reveal any heart disease. His pulmonary workup didn't reveal sleep apnea and he is 100% compliant with the CPAP and feels better. His fatigue is greatly improved his dyspnea is improved and overall he is pleased. He has lost a significant amount of weight but is interested in having a nutrition consult and working more diligently at losing more weight. He is trying to make some dietary changes and has cut out all sweet drinks and sodas. Still eating larger portions than he should but trying to decrease his fast food intake no recent illness, fevers, chills, chest pain, palpitations, shortness of breath, GI or GU complaints. He is happy with ranitidine and has had no trouble with his heartburn since starting that medication  Past Medical History  Diagnosis Date  . GERD (gastroesophageal reflux disease)   . ADD (attention deficit disorder with hyperactivity)     adult  . OSA (obstructive sleep apnea)   . Obesity     morbid  . Hyperlipidemia   . Hypertension   . Thyroid disease     Hypothyroid  . Tremor     idiopathic  . Lumbar back pain     with radiculopathy  . Hypothyroid 12/28/2010    History reviewed. No pertinent past surgical history.  Family History  Problem Relation Age of Onset  . Hypertension Mother   . Obesity Mother   . Cardiomyopathy Mother   . Arthritis Father   . Gout Father   . Hyperlipidemia Father   . Hypertension Father   . Cleft palate Father   . Other Father     disassociative fugue  .  Obesity Father   . ADD / ADHD Daughter     ADHD  . Coronary artery disease Maternal Grandmother     s/p multiple MI's first one in late 39's  . Other Maternal Grandmother     CHF  . Cancer Maternal Grandfather     spinal/ smoker  . Other Maternal Grandfather     Essential tremors  . Leukemia Paternal Grandmother   . Atrial fibrillation Paternal Grandfather 34  . Hypertension Paternal Grandfather   . Other Paternal Grandfather     pacer/defibrillator    History   Social History  . Marital Status: Married    Spouse Name: N/A    Number of Children: N/A  . Years of Education: N/A   Occupational History  . Not on file.   Social History Main Topics  . Smoking status: Never Smoker   . Smokeless tobacco: Never Used  . Alcohol Use: Yes  . Drug Use: No  . Sexually Active: Not on file   Other Topics Concern  . Not on file   Social History Narrative  . No narrative on file    Current Outpatient Prescriptions on File Prior to Visit  Medication Sig Dispense Refill  . aspirin 81 MG tablet Take 81 mg by mouth daily.        Marland Kitchen levothyroxine (SYNTHROID, LEVOTHROID) 25 MCG tablet TAKE ONE  TABLET BY MOUTH EVERY DAY  30 tablet  3  . naproxen (NAPROSYN) 375 MG tablet Take 375 mg by mouth 2 (two) times daily with meals. As needed for pain       . nitroGLYCERIN (NITROSTAT) 0.4 MG SL tablet Place 0.4 mg under the tongue every 5 (five) minutes as needed. As needed for chest pain, may repeat q 5 min X 3 doses, if no resolution go to ER       . Omega-3 Fatty Acids (FISH OIL) 1000 MG CAPS Take 1 capsule by mouth 2 (two) times daily.        . ranitidine (ZANTAC) 150 MG tablet TAKE ONE TABLET BY MOUTH EVERY DAY  30 tablet  3  . cyclobenzaprine (FLEXERIL) 10 MG tablet Take 10 mg by mouth 2 (two) times daily as needed. For pain- may cause sedation       . ranitidine (ZANTAC) 150 MG capsule Take 150 mg by mouth daily.          No Known Allergies  Review of Systems  Review of Systems    Constitutional: Negative for fever and malaise/fatigue.  HENT: Negative for congestion.   Eyes: Negative for discharge.  Respiratory: Negative for shortness of breath.   Cardiovascular: Negative for chest pain, palpitations and leg swelling.  Gastrointestinal: Negative for nausea, abdominal pain and diarrhea.  Genitourinary: Negative for dysuria.  Musculoskeletal: Negative for falls.  Skin: Negative for rash.  Neurological: Negative for loss of consciousness and headaches.  Endo/Heme/Allergies: Negative for polydipsia.  Psychiatric/Behavioral: Negative for depression and suicidal ideas. The patient is not nervous/anxious and does not have insomnia.     Objective  BP 140/87  Pulse 73  Temp(Src) 97.9 F (36.6 C) (Oral)  Ht 6\' 1"  (1.854 m)  Wt 376 lb 6.4 oz (170.734 kg)  BMI 49.66 kg/m2  SpO2 98%  Physical Exam  Physical Exam  Constitutional: He is oriented to person, place, and time and well-developed, well-nourished, and in no distress. No distress.  HENT:  Head: Normocephalic and atraumatic.  Eyes: Conjunctivae are normal.  Neck: Neck supple. No thyromegaly present.  Cardiovascular: Normal rate, regular rhythm and normal heart sounds.   No murmur heard. Pulmonary/Chest: Effort normal and breath sounds normal. No respiratory distress.  Abdominal: He exhibits no distension and no mass. There is no tenderness.  Musculoskeletal: He exhibits no edema.  Neurological: He is alert and oriented to person, place, and time.  Skin: Skin is warm.  Psychiatric: Memory, affect and judgment normal.    Lab Results  Component Value Date   TSH 3.185 12/28/2010   Lab Results  Component Value Date   WBC 8.4 07/13/2010   HGB 13.9 07/13/2010   HCT 42.4 07/13/2010   MCV 85.5 07/13/2010   PLT 369 07/13/2010   Lab Results  Component Value Date   CREATININE 1.04 07/13/2010   BUN 18 07/13/2010   NA 138 07/13/2010   K 4.4 07/13/2010   CL 105 07/13/2010   CO2 24 07/13/2010   Lab Results   Component Value Date   ALT 23 07/13/2010   AST 20 07/13/2010   ALKPHOS 64 07/13/2010   BILITOT 0.6 07/13/2010   Lab Results  Component Value Date   CHOL 198 07/13/2010   Lab Results  Component Value Date   HDL 28* 07/13/2010   Lab Results  Component Value Date   LDLCALC 118* 07/13/2010   Lab Results  Component Value Date   TRIG 262* 07/13/2010   Lab  Results  Component Value Date   CHOLHDL 7.1 Ratio 07/13/2010     Assessment & Plan  SLEEP APNEA Following with [facility] and using [facility] for his CPAP machine, reports he is using it consistently and feels better and more rested since starting therapy.   MORBID OBESITY Has lost weight since his last visit. He has cut out also lists and is trying to make better food choices but he continues to be eat big portions. Encouraged to to try the DASH diet given handouts today. Is referred to Nutritionist and encouraged to avoid trans fats.  CHEST PAIN No recent chest pain cardiac work up was unremarkable.  GERD Good response to weight loss and Ranitidine, he has not had any recent episodes. Avoid offending foods and continue with weightloss efforts  Hypothyroid Repeat TSH shows good response to low dose Levothyroxine. Continue same  Hyperlipidemia Patient ate a heavy lunch before coming in today but due to the difficulties he has getting in we will run a panel today for surveillance and address when labs are available  ELEVATED BP READING WITHOUT DX HYPERTENSION Acceptable numbers today, avoid sodium and continue weight loss attempts, increase exercise level as tolerated and monitor

## 2010-12-29 NOTE — Assessment & Plan Note (Signed)
Acceptable numbers today, avoid sodium and continue weight loss attempts, increase exercise level as tolerated and monitor

## 2010-12-29 NOTE — Assessment & Plan Note (Signed)
Patient ate a heavy lunch before coming in today but due to the difficulties he has getting in we will run a panel today for surveillance and address when labs are available

## 2010-12-29 NOTE — Assessment & Plan Note (Signed)
No recent chest pain cardiac work up was unremarkable.

## 2010-12-29 NOTE — Progress Notes (Signed)
Addended by: Danise Edge A on: 12/29/2010 04:33 PM   Modules accepted: Orders

## 2010-12-29 NOTE — Assessment & Plan Note (Signed)
Good response to weight loss and Ranitidine, he has not had any recent episodes. Avoid offending foods and continue with weightloss efforts

## 2010-12-29 NOTE — Assessment & Plan Note (Signed)
Repeat TSH shows good response to low dose Levothyroxine. Continue same

## 2010-12-30 LAB — NMR LIPOPROFILE WITH LIPIDS
HDL-C: 31 mg/dL — ABNORMAL LOW (ref >=40)
LDL (calc): 121 mg/dL — ABNORMAL HIGH (ref <100)
LDL Particle Number: 2319 nmol/L — ABNORMAL HIGH (ref <1000)
Triglycerides: 297 mg/dL — ABNORMAL HIGH (ref <150)

## 2010-12-30 MED ORDER — COLESEVELAM HCL 3.75 G PO PACK: PACK | ORAL | Status: DC

## 2010-12-30 NOTE — Progress Notes (Signed)
Addended by: Court Joy on: 12/30/2010 02:56 PM   Modules accepted: Orders

## 2011-01-01 ENCOUNTER — Ambulatory Visit: Payer: Commercial Indemnity | Admitting: Family Medicine

## 2011-01-18 ENCOUNTER — Encounter: Payer: Commercial Indemnity | Attending: Family Medicine | Admitting: *Deleted

## 2011-01-18 ENCOUNTER — Encounter: Payer: Self-pay | Admitting: *Deleted

## 2011-01-18 DIAGNOSIS — E669 Obesity, unspecified: Secondary | ICD-10-CM | POA: Insufficient documentation

## 2011-01-18 DIAGNOSIS — Z713 Dietary counseling and surveillance: Secondary | ICD-10-CM | POA: Insufficient documentation

## 2011-01-18 NOTE — Patient Instructions (Addendum)
Goals:  Eat 3 meals/day plus small snacks; Avoid meal skipping. (Follow Yellow Card)  Limit carbohydrate to 60 grams per meal and 15 grams at snacks to facilitate weight loss.  Increase protein rich foods; have at all meals and snacks.  Choose more whole grains, lean protein, low-fat dairy, and fruits/non-starchy vegetables.   Aim for >30 min of physical activity daily.  Increase fiber intake to 25-30 grams daily to help decrease LDL and increase HDL.   Limit sodium intake to 2000-2400 mg or less daily.  Limit sugar-sweetened beverages, concentrated sweets, and meals away from home.

## 2011-01-18 NOTE — Progress Notes (Signed)
  Medical Nutrition Therapy:  Appt start time:  4:30p  End time:  5:30p.  Assessment:  Primary concerns today: Obesity.  Pt here with morbid obesity for weight management. States he used to have 80+ oz of regular soda daily, but d/c'd after back injury in 07/2010 and lost 8 lbs with no other dietary changes. Coaches 3 different soccer teams, so increased fast food d/t lack of time. Pt does not exercise at this time (see below). Limited fiber intake noted. Strong family h/o T2DM.   MEDICATIONS: Levothyroxine, Ranitidine, ASA 81mg , Fish Oil   DIETARY INTAKE:  Everyday foods include granola bars, apples; Avoids sodas.  Pt eats many meals away from home including fast food.   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   Estimated energy needs: 1700-1800 calories 215-225 g carbohydrates (60g @ meals/15g @ snacks) 105-115 g protein 47-50 g fat  Progress Towards Goal(s):  NEW.   Nutritional Diagnosis:  [place]-3.3 Morbid obesity related to excessive fast food intake as evidenced by patient reported food recall and a BMI of 49.8 kg/m2.    Intervention/Goals:  Eat 3 meals/day plus small snacks; Avoid meal skipping. (Follow Yellow Card)  Limit carbohydrate to 60 grams per meal and 15 grams at snacks to facilitate weight loss.  Increase protein rich foods; have at all meals and snacks.  Choose more whole grains, lean protein, low-fat dairy, and fruits/non-starchy vegetables.   Aim for >30 min of physical activity daily.  Increase fiber intake to 25-30 grams daily to help decrease LDL and increase HDL.   Limit sodium intake to 2000-2400 mg or less daily.  Limit sugar-sweetened beverages, concentrated sweets, and meals away from home.  Monitoring/Evaluation:  Dietary intake, exercise, and body weight in 2 month(s).

## 2011-01-19 ENCOUNTER — Encounter: Payer: Self-pay | Admitting: *Deleted

## 2011-01-29 ENCOUNTER — Other Ambulatory Visit: Payer: Self-pay | Admitting: Family Medicine

## 2011-01-29 ENCOUNTER — Other Ambulatory Visit: Payer: Self-pay | Admitting: *Deleted

## 2011-01-29 DIAGNOSIS — E039 Hypothyroidism, unspecified: Secondary | ICD-10-CM

## 2011-01-29 MED ORDER — LEVOTHYROXINE SODIUM 25 MCG PO TABS: ORAL_TABLET | ORAL | Status: DC

## 2011-01-29 NOTE — Telephone Encounter (Signed)
Pt scheduled for follow up on 03/30/11.  RX sent until that time.

## 2011-02-05 ENCOUNTER — Other Ambulatory Visit: Payer: Self-pay | Admitting: Family Medicine

## 2011-02-05 MED ORDER — COLESEVELAM HCL 625 MG PO TABS: 1875.0000 mg | ORAL_TABLET | Freq: Two times a day (BID) | ORAL | Status: DC

## 2011-02-05 NOTE — Telephone Encounter (Signed)
Pt left a message stating he doesn't like the taste of the Welchol powder and would like the pill sent to his pharmacy.   Ok per md to give Welchol tab 3 tabs bid

## 2011-03-22 ENCOUNTER — Ambulatory Visit: Payer: Commercial Indemnity | Admitting: *Deleted

## 2011-03-30 ENCOUNTER — Ambulatory Visit: Payer: Commercial Indemnity | Admitting: Family Medicine

## 2011-04-07 ENCOUNTER — Ambulatory Visit: Payer: Commercial Indemnity | Admitting: *Deleted

## 2011-04-08 ENCOUNTER — Encounter: Payer: Self-pay | Admitting: Family Medicine

## 2011-04-08 ENCOUNTER — Ambulatory Visit (INDEPENDENT_AMBULATORY_CARE_PROVIDER_SITE_OTHER): Payer: Commercial Indemnity | Admitting: Family Medicine

## 2011-04-08 DIAGNOSIS — R358 Other polyuria: Secondary | ICD-10-CM

## 2011-04-08 DIAGNOSIS — F341 Dysthymic disorder: Secondary | ICD-10-CM

## 2011-04-08 DIAGNOSIS — F329 Major depressive disorder, single episode, unspecified: Secondary | ICD-10-CM

## 2011-04-08 DIAGNOSIS — R5381 Other malaise: Secondary | ICD-10-CM

## 2011-04-08 DIAGNOSIS — F3289 Other specified depressive episodes: Secondary | ICD-10-CM

## 2011-04-08 DIAGNOSIS — F32A Depression, unspecified: Secondary | ICD-10-CM

## 2011-04-08 DIAGNOSIS — R531 Weakness: Secondary | ICD-10-CM

## 2011-04-08 DIAGNOSIS — R030 Elevated blood-pressure reading, without diagnosis of hypertension: Secondary | ICD-10-CM

## 2011-04-08 DIAGNOSIS — R209 Unspecified disturbances of skin sensation: Secondary | ICD-10-CM

## 2011-04-08 DIAGNOSIS — F418 Other specified anxiety disorders: Secondary | ICD-10-CM

## 2011-04-08 DIAGNOSIS — R5383 Other fatigue: Secondary | ICD-10-CM

## 2011-04-08 DIAGNOSIS — R202 Paresthesia of skin: Secondary | ICD-10-CM

## 2011-04-08 DIAGNOSIS — R3589 Other polyuria: Secondary | ICD-10-CM

## 2011-04-08 DIAGNOSIS — E785 Hyperlipidemia, unspecified: Secondary | ICD-10-CM

## 2011-04-08 DIAGNOSIS — G4730 Sleep apnea, unspecified: Secondary | ICD-10-CM

## 2011-04-08 DIAGNOSIS — E669 Obesity, unspecified: Secondary | ICD-10-CM

## 2011-04-08 HISTORY — DX: Other fatigue: R53.83

## 2011-04-08 LAB — RENAL FUNCTION PANEL
Albumin: 4.4 g/dL (ref 3.5–5.2)
CO2: 24 mEq/L (ref 19–32)
Calcium: 9 mg/dL (ref 8.4–10.5)
Creatinine, Ser: 1 mg/dL (ref 0.4–1.5)
Glucose, Bld: 91 mg/dL (ref 70–99)
Sodium: 139 mEq/L (ref 135–145)

## 2011-04-08 LAB — HEPATIC FUNCTION PANEL
ALT: 20 U/L (ref 0–53)
AST: 15 U/L (ref 0–37)
Bilirubin, Direct: 0 mg/dL (ref 0.0–0.3)
Total Bilirubin: 0.8 mg/dL (ref 0.3–1.2)
Total Protein: 8.1 g/dL (ref 6.0–8.3)

## 2011-04-08 LAB — LIPID PANEL
HDL: 35.2 mg/dL — ABNORMAL LOW (ref >39.00)
Total CHOL/HDL Ratio: 5

## 2011-04-08 LAB — TSH: TSH: 3.16 u[IU]/mL (ref 0.35–5.50)

## 2011-04-08 LAB — CBC
HCT: 40.4 % (ref 39.0–52.0)
Hemoglobin: 13 g/dL (ref 13.0–17.0)
MCH: 27.4 pg (ref 26.0–34.0)
MCHC: 32.2 g/dL (ref 30.0–36.0)
RBC: 4.74 MIL/uL (ref 4.22–5.81)

## 2011-04-08 LAB — FERRITIN: Ferritin: 74.5 ng/mL (ref 22.0–322.0)

## 2011-04-08 LAB — VITAMIN B12: Vitamin B-12: 185 pg/mL — ABNORMAL LOW (ref 211–911)

## 2011-04-08 LAB — HEMOGLOBIN A1C: Hgb A1c MFr Bld: 5.9 % (ref 4.6–6.5)

## 2011-04-08 MED ORDER — CITALOPRAM HYDROBROMIDE 10 MG PO TABS: 10.0000 mg | ORAL_TABLET | Freq: Every day | ORAL | Status: DC

## 2011-04-08 NOTE — Patient Instructions (Signed)

## 2011-04-09 ENCOUNTER — Ambulatory Visit: Payer: Commercial Indemnity | Admitting: Family Medicine

## 2011-04-12 ENCOUNTER — Encounter: Payer: Self-pay | Admitting: Family Medicine

## 2011-04-12 DIAGNOSIS — F418 Other specified anxiety disorders: Secondary | ICD-10-CM

## 2011-04-12 DIAGNOSIS — R202 Paresthesia of skin: Secondary | ICD-10-CM

## 2011-04-12 HISTORY — DX: Other specified anxiety disorders: F41.8

## 2011-04-12 HISTORY — DX: Paresthesia of skin: R20.2

## 2011-04-12 NOTE — Assessment & Plan Note (Signed)
He reports he is using and tolerating his CPAP relatively well and is frustrated with how tired he still is

## 2011-04-12 NOTE — Assessment & Plan Note (Addendum)
Patient acknowledges low mood is likely confrounding other issues. We will try a low dose of Citalopram to see if he gets some improvement

## 2011-04-12 NOTE — Progress Notes (Signed)
Bobby [name] [id number] 12/10/1975 04/12/2011      Progress Note-Follow Up  Subjective  Chief Complaint  Chief Complaint  Patient presents with  . Fatigue    X 2-3 weeks ago- tired from walking short distances  . muscle weakness    HPI  Patient is a 35 year old Caucasian male who is in today to discuss fatigue, paresthesias or weakness. He has been using his CPAP and is frustrated that is not making it better. He notes over the last 2-3 weeks he's become progressively more tired feels like his legs are somewhat weak his hands feel weak. He notes a tightness sensation of paresthesia or abnormal sensation in his extremities as well. He notes even minimal exertion makes him feel excessively tired. He notes feeling very irritable and emotionally labile. He has not been taking his WelChol due to a difficulty in taking a light switch medications. He denies chest pain or palpitations. He is sleeping roughly 8 hours a night. He had some mild congestion last week but he says that's essentially resolved. He's had low-grade headaches and has none at present.  Past Medical History  Diagnosis Date  . GERD (gastroesophageal reflux disease)   . ADD (attention deficit disorder with hyperactivity)     adult  . OSA (obstructive sleep apnea)   . Obesity     morbid  . Hyperlipidemia   . Hypertension   . Thyroid disease     Hypothyroid  . Tremor     idiopathic  . Lumbar back pain     with radiculopathy  . Hypothyroid 12/28/2010  . Fatigue 04/08/2011  . Depression with anxiety 04/12/2011  . Paresthesia Of Skin 04/12/2011    History reviewed. No pertinent past surgical history.  Family History  Problem Relation Age of Onset  . Hypertension Mother   . Obesity Mother   . Cardiomyopathy Mother   . Arthritis Father   . Gout Father   . Hyperlipidemia Father   . Hypertension Father   . Cleft palate Father   . Other Father     disassociative fugue  . Obesity Father   . ADD / ADHD Daughter       ADHD  . Coronary artery disease Maternal Grandmother     s/p multiple MI's first one in late 78's  . Other Maternal Grandmother     CHF  . Cancer Maternal Grandfather     spinal/ smoker  . Other Maternal Grandfather     Essential tremors  . Leukemia Paternal Grandmother   . Atrial fibrillation Paternal Grandfather 61  . Hypertension Paternal Grandfather   . Other Paternal Grandfather     pacer/defibrillator    History   Social History  . Marital Status: Married    Spouse Name: N/A    Number of Children: N/A  . Years of Education: N/A   Occupational History  . Not on file.   Social History Main Topics  . Smoking status: Never Smoker   . Smokeless tobacco: Never Used  . Alcohol Use: No     Rare - 1-2x/year  . Drug Use: No  . Sexually Active: Not on file   Other Topics Concern  . Not on file   Social History Narrative  . No narrative on file    Current Outpatient Prescriptions on File Prior to Visit  Medication Sig Dispense Refill  . aspirin 81 MG tablet Take 81 mg by mouth daily.        . colesevelam (WELCHOL) 625 MG  tablet Take 3 tablets (1,875 mg total) by mouth 2 (two) times daily with a meal.  180 tablet  2  . cyclobenzaprine (FLEXERIL) 10 MG tablet Take 10 mg by mouth 2 (two) times daily as needed. For pain- may cause sedation       . levothyroxine (SYNTHROID, LEVOTHROID) 25 MCG tablet Take one tablet by mouth daily  30 tablet  11  . naproxen (NAPROSYN) 375 MG tablet Take 375 mg by mouth 2 (two) times daily with meals. As needed for pain       . nitroGLYCERIN (NITROSTAT) 0.4 MG SL tablet Place 0.4 mg under the tongue every 5 (five) minutes as needed. As needed for chest pain, may repeat q 5 min X 3 doses, if no resolution go to ER       . Omega-3 Fatty Acids (FISH OIL) 1000 MG CAPS Take 1 capsule by mouth 2 (two) times daily.        . ranitidine (ZANTAC) 150 MG tablet TAKE ONE TABLET BY MOUTH EVERY DAY  30 tablet  3    No Known Allergies  Review of  Systems  Review of Systems  Constitutional: Positive for malaise/fatigue. Negative for fever.  HENT: Positive for congestion.   Eyes: Negative for discharge.  Respiratory: Positive for shortness of breath. Negative for sputum production and wheezing.   Cardiovascular: Negative for chest pain, palpitations, orthopnea and leg swelling.  Gastrointestinal: Negative for nausea, abdominal pain and diarrhea.  Genitourinary: Negative for dysuria.  Musculoskeletal: Negative for falls.  Skin: Negative for rash.  Neurological: Positive for tingling, sensory change and weakness. Negative for loss of consciousness and headaches.  Endo/Heme/Allergies: Negative for polydipsia.  Psychiatric/Behavioral: Negative for depression and suicidal ideas. The patient is not nervous/anxious and does not have insomnia.     Objective  BP 142/102  Pulse 65  Temp(Src) 97.9 F (36.6 C) (Oral)  Ht 6\' 1"  (1.854 m)  Wt 383 lb (173.728 kg)  BMI 50.53 kg/m2  SpO2 98%  Physical Exam  Physical Exam  Constitutional: He is oriented to person, place, and time and well-developed, well-nourished, and in no distress. No distress.       obese  HENT:  Head: Normocephalic and atraumatic.  Eyes: Conjunctivae are normal.  Neck: Neck supple. No thyromegaly present.  Cardiovascular: Normal rate and regular rhythm.  Exam reveals no gallop.   No murmur heard. Pulmonary/Chest: Effort normal and breath sounds normal. No respiratory distress.  Abdominal: He exhibits no distension and no mass. There is no tenderness.  Musculoskeletal: He exhibits no edema.  Neurological: He is alert and oriented to person, place, and time.  Skin: Skin is warm.  Psychiatric: Memory, affect and judgment normal.    Lab Results  Component Value Date   TSH 3.16 04/08/2011   Lab Results  Component Value Date   WBC 6.5 04/08/2011   HGB 13.0 04/08/2011   HCT 40.4 04/08/2011   MCV 85.2 04/08/2011   PLT 321 04/08/2011   Lab Results    Component Value Date   CREATININE 1.0 04/08/2011   BUN 15 04/08/2011   NA 139 04/08/2011   K 4.1 04/08/2011   CL 106 04/08/2011   CO2 24 04/08/2011   Lab Results  Component Value Date   ALT 20 04/08/2011   AST 15 04/08/2011   ALKPHOS 51 04/08/2011   BILITOT 0.8 04/08/2011   Lab Results  Component Value Date   CHOL 191 04/08/2011   Lab Results  Component Value Date  HDL 35.20* 04/08/2011   Lab Results  Component Value Date   LDLCALC 119* 04/08/2011   Lab Results  Component Value Date   TRIG 184.0* 04/08/2011   Lab Results  Component Value Date   CHOLHDL 5 04/08/2011     Assessment & Plan  SLEEP APNEA He reports he is using and tolerating his CPAP relatively well and is frustrated with how tired he still is  ELEVATED BP READING WITHOUT DX HYPERTENSION Mildly elevated but patient very frustrated this am about his fatigue, will reassess at next visit  Depression with anxiety Patient acknowledges low mood is likely confrounding other issues. We will try a low dose of Citalopram to see if he gets some improvement  Paresthesia Of Skin Noted to varying degrees in all four extremities, he is noted to be low in his Vitamin D levels will start with Vitamin B12 levels we will start with weekly shots x 4 and then progress to monthly, check further labs to invesitigate.  Fatigue Multifactorial, also noted to have low Testosterone after his visit, he will be asked to come back in to discuss his options

## 2011-04-12 NOTE — Assessment & Plan Note (Signed)
Multifactorial, also noted to have low Testosterone after his visit, he will be asked to come back in to discuss his options

## 2011-04-12 NOTE — Assessment & Plan Note (Signed)
Mildly elevated but patient very frustrated this am about his fatigue, will reassess at next visit

## 2011-04-12 NOTE — Assessment & Plan Note (Signed)
Noted to varying degrees in all four extremities, he is noted to be low in his Vitamin D levels will start with Vitamin B12 levels we will start with weekly shots x 4 and then progress to monthly, check further labs to invesitigate.

## 2011-04-13 ENCOUNTER — Encounter: Payer: Self-pay | Admitting: Family Medicine

## 2011-04-13 ENCOUNTER — Ambulatory Visit (INDEPENDENT_AMBULATORY_CARE_PROVIDER_SITE_OTHER): Payer: Commercial Indemnity | Admitting: Family Medicine

## 2011-04-13 VITALS — BP 144/94 | HR 78 | Temp 98.0°F | Ht 73.0 in | Wt 386.4 lb

## 2011-04-13 DIAGNOSIS — E538 Deficiency of other specified B group vitamins: Secondary | ICD-10-CM

## 2011-04-13 DIAGNOSIS — F341 Dysthymic disorder: Secondary | ICD-10-CM

## 2011-04-13 DIAGNOSIS — Z8639 Personal history of other endocrine, nutritional and metabolic disease: Secondary | ICD-10-CM

## 2011-04-13 DIAGNOSIS — F418 Other specified anxiety disorders: Secondary | ICD-10-CM

## 2011-04-13 DIAGNOSIS — Z862 Personal history of diseases of the blood and blood-forming organs and certain disorders involving the immune mechanism: Secondary | ICD-10-CM

## 2011-04-13 HISTORY — DX: Deficiency of other specified B group vitamins: E53.8

## 2011-04-13 HISTORY — DX: Personal history of other endocrine, nutritional and metabolic disease: Z86.39

## 2011-04-13 MED ORDER — CYANOCOBALAMIN 1000 MCG/ML IJ SOLN
1000.0000 ug | Freq: Once | INTRAMUSCULAR | Status: AC
  Administered 2011-04-13: 1000 ug via INTRAMUSCULAR

## 2011-04-13 MED ORDER — CYANOCOBALAMIN 1000 MCG/ML IJ SOLN: 1000.0000 ug | Freq: Once | INTRAMUSCULAR | Status: DC

## 2011-04-13 NOTE — Patient Instructions (Signed)
Pernicious Anemia Pernicious anemia may be an immune system illness. It causes the production of antibodies to cells of the stomach (parietal cells), and proteins produced by the stomach, which are needed to absorb vitamin B12. The result of this illness is the body does not absorb enough B12 from the diet. This leads to lessened red blood cell production which causes anemia. Vitamin B12 is needed for making red blood cells and keeping the nervous system healthy. Not enough vitamin B12 in your system slowly affects sensory and motor nerves. This causes problems with your nervous system (neurological) to develop over time. Neurological effects of vitamin B12 deficiency may be seen before anemia is diagnosed. This affects both men and women, between ages 40 and 70. The anemia also affects the bowel, the heart and vascular systems and cannot be prevented. CAUSES  Pernicious anemia is due to a lack of substance called intrinsic factor. This is a substance made by cells in the stomach. It makes it possible to absorb vitamin B12. The reason for the lack of this substance is unknown but it may be autoimmune, genetic, or both. SYMPTOMS  The following problems may be seen with this illness:  Problems develop slowly.   Rapid heart rate.   Nausea, appetite loss, and weight loss.   Difficulty maintaining proper balance.   Yellow eyes and skin.   Loss of deep tendon reflexes.   Depression.   Confusion, poor memory, and dementia.   Ringing in the ears (tinnitus).   Weakness, especially in the arms and legs.   Sore tongue.   Numbness or tingling in the hands and feet.   Pale lips, tongue, and gums.   Bleeding gums.   Shortness of breath.   Headache.   Fatigue.  RISK OF VITAMIN B12 DEFICIENCY INCREASES WITH:  Diseases or surgery affecting the stomach.   Diabetes and autoimmune disorders. Autoimmune disorders are diseases where the body makes antibodies which attack your own body tissues.     Thyroid disorders.   Genetic factors, such as in people of Northern European ancestry. It is rare in African Americans and Asians.   Family history of pernicious anemia.   Age over 40.   Strict vegetarian diet or infants breast-fed by a mother on a strict vegetarian diet.   Lack of stomach acid in older adults.   Parasitic infections and intestinal diseases.   Drugs such as H2 blockers, proton pump inhibitors, colchicine, neomycin, and aminosalicylic acid.   Alcoholism.  PREVENTION  Pernicious anemia cannot be prevented but vitamin B12 deficiencies can be prevented.   For pernicious anemia, lifelong vitamin B12 therapy will help symptoms and prevent complications.   Dietary changes can prevent deficiency. B12 is mostly from animal sources so a deficiency is more likely in a vegetarian who does not eat eggs or dairy products.  RELATED COMPLICATIONS  Heart failure.   Nerve damage that can not be reversed.   Gastric cancer.  DIAGNOSIS  Your caregiver can determine what is wrong by:  Doing blood tests for vitamin B12 levels.   Checking for antibodies to the intrinsic factor.   Measuring the body's ability to absorb vitamin B12.  TREATMENT   Life long treatment usually involves vitamin B12 replacement. Monthly vitamin B12 injections are the treatment of choice to correct vitamin B12 deficiency and may be given by the patient. This therapy corrects the anemia and it may correct the neurological complications if given early enough. About 1% of vitamin B12 is absorbed (even in   the absence of intrinsic factor) so some caregivers recommend that elderly patients with gastric atrophy take oral vitamin B12 supplements in addition to monthly injections.   Some symptoms should start to clear up in a few days after treatment begins but other symptoms may take several months.   Additionally, other conditions which may lead to a deficiency should be treated.   Stop drinking alcohol  if alcoholism led to the vitamin B12 deficiency.   For other patients, the vitamin may be taken by mouth or as a nasal gel (or in addition to injections).   Iron supplements may be prescribed.   Avoid taking high amounts of folic acid. It can mask the signs of vitamin B12 deficiency.   Activity may be limited until symptoms improve.   Eat a well-balanced diet.   People on strict vegetarian diets can change their diet or take vitamin B12 supplements for life.  PROGNOSIS   When caught early, the prognosis is good. Most people will do well.   Patients with this illness have a higher incidence of cancer and polyps of the stomach.   Nervous system problems may not improve if treatment does not start soon enough.  Document Released: 08/21/2003 Document Revised: 02/10/2011 Document Reviewed: 05/28/2008 [facility] Patient Information 2012 [place], [place].

## 2011-04-13 NOTE — Assessment & Plan Note (Signed)
Patient in recently c/o paresthesias and fatigue noted to be low in his Vitamin B12 levels. Will start weekly Vitamin B12 injections today x 4 doses and then drop to monthly. Will check a Intrinsic Factor, Methylmalanoic Acid Level in order to consider if we can try switching him to oral supplementation in the future.

## 2011-04-13 NOTE — Assessment & Plan Note (Addendum)
This was also noted to be low, patient agrees to return next week for his first testosterone injection next week when he gets his second dose of Vitamin B 12. We may consider switch to topical medications in the future. Will start with a 100mg  weekly and then reassess after initial response.

## 2011-04-13 NOTE — Progress Notes (Signed)
Bobby [name] [id number] Oct 26, 1975 04/13/2011      Progress Note-Follow Up  Subjective  Chief Complaint  Chief Complaint  Patient presents with  . Injections    B12 injection  . discuss testosterone    HPI  Patient is a 35 year old Caucasian male in today for first vitamin B12 shot. He continues to struggle with him and he as well as his paresthesias. His vitamin B12 level from below and he agrees to start today. No new complaints. No fevers or, chills. No chest pain, palpitations shortness of breath, GI or GU complaints. He does note some mild nasal congestion but denies any significant rhinorrhea, sore throat or ear pain  Past Medical History  Diagnosis Date  . GERD (gastroesophageal reflux disease)   . ADD (attention deficit disorder with hyperactivity)     adult  . OSA (obstructive sleep apnea)   . Obesity     morbid  . Hyperlipidemia   . Hypertension   . Thyroid disease     Hypothyroid  . Tremor     idiopathic  . Lumbar back pain     with radiculopathy  . Hypothyroid 12/28/2010  . Fatigue 04/08/2011  . Depression with anxiety 04/12/2011  . Paresthesia Of Skin 04/12/2011  . Vitamin B 12 deficiency 04/13/2011  . History of hypotestosteronemia 04/13/2011    History reviewed. No pertinent past surgical history.  Family History  Problem Relation Age of Onset  . Hypertension Mother   . Obesity Mother   . Cardiomyopathy Mother   . Arthritis Father   . Gout Father   . Hyperlipidemia Father   . Hypertension Father   . Cleft palate Father   . Other Father     disassociative fugue  . Obesity Father   . ADD / ADHD Daughter     ADHD  . Coronary artery disease Maternal Grandmother     s/p multiple MI's first one in late 49's  . Other Maternal Grandmother     CHF  . Cancer Maternal Grandfather     spinal/ smoker  . Other Maternal Grandfather     Essential tremors  . Leukemia Paternal Grandmother   . Atrial fibrillation Paternal Grandfather 72  . Hypertension  Paternal Grandfather   . Other Paternal Grandfather     pacer/defibrillator    History   Social History  . Marital Status: Married    Spouse Name: N/A    Number of Children: N/A  . Years of Education: N/A   Occupational History  . Not on file.   Social History Main Topics  . Smoking status: Never Smoker   . Smokeless tobacco: Never Used  . Alcohol Use: No     Rare - 1-2x/year  . Drug Use: No  . Sexually Active: Not on file   Other Topics Concern  . Not on file   Social History Narrative  . No narrative on file    Current Outpatient Prescriptions on File Prior to Visit  Medication Sig Dispense Refill  . aspirin 81 MG tablet Take 81 mg by mouth daily.        . citalopram (CELEXA) 10 MG tablet Take 1 tablet (10 mg total) by mouth daily.  30 tablet  2  . colesevelam (WELCHOL) 625 MG tablet Take 3 tablets (1,875 mg total) by mouth 2 (two) times daily with a meal.  180 tablet  2  . cyclobenzaprine (FLEXERIL) 10 MG tablet Take 10 mg by mouth 2 (two) times daily as needed. For pain-  may cause sedation       . levothyroxine (SYNTHROID, LEVOTHROID) 25 MCG tablet Take one tablet by mouth daily  30 tablet  11  . naproxen (NAPROSYN) 375 MG tablet Take 375 mg by mouth 2 (two) times daily with meals. As needed for pain       . nitroGLYCERIN (NITROSTAT) 0.4 MG SL tablet Place 0.4 mg under the tongue every 5 (five) minutes as needed. As needed for chest pain, may repeat q 5 min X 3 doses, if no resolution go to ER       . Omega-3 Fatty Acids (FISH OIL) 1000 MG CAPS Take 1 capsule by mouth 2 (two) times daily.        . ranitidine (ZANTAC) 150 MG tablet TAKE ONE TABLET BY MOUTH EVERY DAY  30 tablet  3   No current facility-administered medications on file prior to visit.    No Known Allergies  Review of Systems  Review of Systems  Constitutional: Positive for malaise/fatigue. Negative for fever.  HENT: Negative for congestion.   Eyes: Negative for discharge.  Respiratory: Negative  for shortness of breath.   Cardiovascular: Negative for chest pain, palpitations and leg swelling.  Gastrointestinal: Negative for nausea, abdominal pain and diarrhea.  Genitourinary: Negative for dysuria.  Musculoskeletal: Negative for falls.  Skin: Negative for rash.  Neurological: Positive for tingling. Negative for loss of consciousness and headaches.  Endo/Heme/Allergies: Negative for polydipsia.  Psychiatric/Behavioral: Positive for depression. Negative for suicidal ideas. The patient is nervous/anxious. The patient does not have insomnia.     Objective  BP 144/94  Pulse 78  Temp(Src) 98 F (36.7 C) (Oral)  Ht 6\' 1"  (1.854 m)  Wt 386 lb 6.4 oz (175.27 kg)  BMI 50.98 kg/m2  SpO2 96%  Physical Exam  Physical Exam  Constitutional: He is oriented to person, place, and time and well-developed, well-nourished, and in no distress. No distress.       obese  HENT:  Head: Normocephalic and atraumatic.  Eyes: Conjunctivae are normal.  Neck: Neck supple. No thyromegaly present.  Cardiovascular: Normal rate, regular rhythm and normal heart sounds.   No murmur heard. Pulmonary/Chest: Effort normal and breath sounds normal. No respiratory distress.  Abdominal: He exhibits no distension and no mass. There is no tenderness.  Musculoskeletal: He exhibits no edema.  Neurological: He is alert and oriented to person, place, and time.  Skin: Skin is warm.  Psychiatric: Memory, affect and judgment normal.    Lab Results  Component Value Date   TSH 3.16 04/08/2011   Lab Results  Component Value Date   WBC 6.5 04/08/2011   HGB 13.0 04/08/2011   HCT 40.4 04/08/2011   MCV 85.2 04/08/2011   PLT 321 04/08/2011   Lab Results  Component Value Date   CREATININE 1.0 04/08/2011   BUN 15 04/08/2011   NA 139 04/08/2011   K 4.1 04/08/2011   CL 106 04/08/2011   CO2 24 04/08/2011   Lab Results  Component Value Date   ALT 20 04/08/2011   AST 15 04/08/2011   ALKPHOS 51 04/08/2011    BILITOT 0.8 04/08/2011   Lab Results  Component Value Date   CHOL 191 04/08/2011   Lab Results  Component Value Date   HDL 35.20* 04/08/2011   Lab Results  Component Value Date   LDLCALC 119* 04/08/2011   Lab Results  Component Value Date   TRIG 184.0* 04/08/2011   Lab Results  Component Value Date   CHOLHDL  5 04/08/2011     Assessment & Plan  Vitamin B 12 deficiency Patient in recently c/o paresthesias and fatigue noted to be low in his Vitamin B12 levels. Will start weekly Vitamin B12 injections today x 4 doses and then drop to monthly. Will check a Intrinsic Factor, Methylmalanoic Acid Level in order to consider if we can try switching him to oral supplementation in the future.  History of hypotestosteronemia This was also noted to be low, patient agrees to return next week for his first testosterone injection next week when he gets his second dose of Vitamin B 12. We may consider switch to topical medications in the future. Will start with a 100mg  weekly and then reassess after initial response.  Depression with anxiety Has just started the Citalopram in the past few days. Has not had any concerning side effects and as expected does not note any response thus far.

## 2011-04-13 NOTE — Assessment & Plan Note (Signed)
Has just started the Citalopram in the past few days. Has not had any concerning side effects and as expected does not note any response thus far.

## 2011-04-14 LAB — HOMOCYSTEINE: Homocysteine: 12.9 umol/L (ref 4.0–15.4)

## 2011-04-16 ENCOUNTER — Ambulatory Visit: Payer: Commercial Indemnity | Admitting: Family Medicine

## 2011-04-16 LAB — INTRINSIC FACTOR ANTIBODIES: Intrinsic Factor: POSITIVE — AB

## 2011-04-20 ENCOUNTER — Ambulatory Visit (INDEPENDENT_AMBULATORY_CARE_PROVIDER_SITE_OTHER): Payer: Commercial Indemnity

## 2011-04-20 DIAGNOSIS — E349 Endocrine disorder, unspecified: Secondary | ICD-10-CM

## 2011-04-20 DIAGNOSIS — E538 Deficiency of other specified B group vitamins: Secondary | ICD-10-CM

## 2011-04-20 MED ORDER — CYANOCOBALAMIN 1000 MCG/ML IJ SOLN
1000.0000 ug | Freq: Once | INTRAMUSCULAR | Status: AC
  Administered 2011-04-20: 1000 ug via INTRAMUSCULAR

## 2011-04-20 MED ORDER — TESTOSTERONE CYPIONATE 100 MG/ML IM SOLN
100.0000 mg | INTRAMUSCULAR | Status: DC
  Administered 2011-04-20: 100 mg via INTRAMUSCULAR

## 2011-04-20 MED ORDER — TESTOSTERONE CYPIONATE 100 MG/ML IM SOLN: 100.0000 mg | INTRAMUSCULAR | Status: DC

## 2011-04-20 MED ORDER — TESTOSTERONE CYPIONATE 200 MG/ML IM SOLN: 100.0000 mg | INTRAMUSCULAR | Status: DC

## 2011-04-23 LAB — METHYLMALONIC ACID, SERUM: Methylmalonic Acid, Quantitative: 76 nmol/L — ABNORMAL LOW (ref 87–318)

## 2011-04-27 ENCOUNTER — Ambulatory Visit (INDEPENDENT_AMBULATORY_CARE_PROVIDER_SITE_OTHER): Payer: Commercial Indemnity

## 2011-04-27 DIAGNOSIS — E538 Deficiency of other specified B group vitamins: Secondary | ICD-10-CM

## 2011-04-27 DIAGNOSIS — E349 Endocrine disorder, unspecified: Secondary | ICD-10-CM

## 2011-04-27 DIAGNOSIS — E291 Testicular hypofunction: Secondary | ICD-10-CM

## 2011-04-27 MED ORDER — TESTOSTERONE CYPIONATE 200 MG/ML IM SOLN
100.0000 mg | INTRAMUSCULAR | Status: DC
  Administered 2011-04-27: 100 mg via INTRAMUSCULAR

## 2011-04-27 MED ORDER — CYANOCOBALAMIN 1000 MCG/ML IJ SOLN
1000.0000 ug | Freq: Once | INTRAMUSCULAR | Status: AC
  Administered 2011-04-27: 1000 ug via INTRAMUSCULAR

## 2011-05-04 ENCOUNTER — Ambulatory Visit (INDEPENDENT_AMBULATORY_CARE_PROVIDER_SITE_OTHER): Payer: Commercial Indemnity | Admitting: Family Medicine

## 2011-05-04 ENCOUNTER — Encounter: Payer: Self-pay | Admitting: Family Medicine

## 2011-05-04 DIAGNOSIS — Z8639 Personal history of other endocrine, nutritional and metabolic disease: Secondary | ICD-10-CM

## 2011-05-04 DIAGNOSIS — F418 Other specified anxiety disorders: Secondary | ICD-10-CM

## 2011-05-04 DIAGNOSIS — F341 Dysthymic disorder: Secondary | ICD-10-CM

## 2011-05-04 DIAGNOSIS — R030 Elevated blood-pressure reading, without diagnosis of hypertension: Secondary | ICD-10-CM

## 2011-05-04 DIAGNOSIS — E538 Deficiency of other specified B group vitamins: Secondary | ICD-10-CM

## 2011-05-04 DIAGNOSIS — E039 Hypothyroidism, unspecified: Secondary | ICD-10-CM

## 2011-05-04 DIAGNOSIS — E349 Endocrine disorder, unspecified: Secondary | ICD-10-CM

## 2011-05-04 DIAGNOSIS — Z862 Personal history of diseases of the blood and blood-forming organs and certain disorders involving the immune mechanism: Secondary | ICD-10-CM

## 2011-05-04 DIAGNOSIS — E291 Testicular hypofunction: Secondary | ICD-10-CM

## 2011-05-04 DIAGNOSIS — G4730 Sleep apnea, unspecified: Secondary | ICD-10-CM

## 2011-05-04 MED ORDER — TESTOSTERONE CYPIONATE 200 MG/ML IM SOLN
100.0000 mg | INTRAMUSCULAR | Status: DC
  Administered 2011-05-04: 100 mg via INTRAMUSCULAR

## 2011-05-04 MED ORDER — CYANOCOBALAMIN 1000 MCG/ML IJ SOLN
1000.0000 ug | Freq: Once | INTRAMUSCULAR | Status: AC
  Administered 2011-05-04: 1000 ug via INTRAMUSCULAR

## 2011-05-04 NOTE — Assessment & Plan Note (Signed)
Tolerating the Citalopram does note his mood is improving

## 2011-05-04 NOTE — Assessment & Plan Note (Signed)
Mild elevation today. Minimize sodium and continue to monitor

## 2011-05-04 NOTE — Assessment & Plan Note (Addendum)
He reports his paresthesias and weakness are slowly improving. No difficulty with the injections. Will repeat another shot next week and then drop them to every other week and recheck a level.

## 2011-05-04 NOTE — Assessment & Plan Note (Signed)
Continues to use his CPAP routinely

## 2011-05-04 NOTE — Patient Instructions (Signed)
Pernicious Anemia Pernicious anemia may be an immune system illness. It causes the production of antibodies to cells of the stomach (parietal cells), and proteins produced by the stomach, which are needed to absorb vitamin B12. The result of this illness is the body does not absorb enough B12 from the diet. This leads to lessened red blood cell production which causes anemia. Vitamin B12 is needed for making red blood cells and keeping the nervous system healthy. Not enough vitamin B12 in your system slowly affects sensory and motor nerves. This causes problems with your nervous system (neurological) to develop over time. Neurological effects of vitamin B12 deficiency may be seen before anemia is diagnosed. This affects both men and women, between ages 40 and 70. The anemia also affects the bowel, the heart and vascular systems and cannot be prevented. CAUSES  Pernicious anemia is due to a lack of substance called intrinsic factor. This is a substance made by cells in the stomach. It makes it possible to absorb vitamin B12. The reason for the lack of this substance is unknown but it may be autoimmune, genetic, or both. SYMPTOMS  The following problems may be seen with this illness:  Problems develop slowly.   Rapid heart rate.   Nausea, appetite loss, and weight loss.   Difficulty maintaining proper balance.   Yellow eyes and skin.   Loss of deep tendon reflexes.   Depression.   Confusion, poor memory, and dementia.   Ringing in the ears (tinnitus).   Weakness, especially in the arms and legs.   Sore tongue.   Numbness or tingling in the hands and feet.   Pale lips, tongue, and gums.   Bleeding gums.   Shortness of breath.   Headache.   Fatigue.  RISK OF VITAMIN B12 DEFICIENCY INCREASES WITH:  Diseases or surgery affecting the stomach.   Diabetes and autoimmune disorders. Autoimmune disorders are diseases where the body makes antibodies which attack your own body tissues.     Thyroid disorders.   Genetic factors, such as in people of Northern European ancestry. It is rare in African Americans and Asians.   Family history of pernicious anemia.   Age over 40.   Strict vegetarian diet or infants breast-fed by a mother on a strict vegetarian diet.   Lack of stomach acid in older adults.   Parasitic infections and intestinal diseases.   Drugs such as H2 blockers, proton pump inhibitors, colchicine, neomycin, and aminosalicylic acid.   Alcoholism.  PREVENTION  Pernicious anemia cannot be prevented but vitamin B12 deficiencies can be prevented.   For pernicious anemia, lifelong vitamin B12 therapy will help symptoms and prevent complications.   Dietary changes can prevent deficiency. B12 is mostly from animal sources so a deficiency is more likely in a vegetarian who does not eat eggs or dairy products.  RELATED COMPLICATIONS  Heart failure.   Nerve damage that can not be reversed.   Gastric cancer.  DIAGNOSIS  Your caregiver can determine what is wrong by:  Doing blood tests for vitamin B12 levels.   Checking for antibodies to the intrinsic factor.   Measuring the body's ability to absorb vitamin B12.  TREATMENT   Life long treatment usually involves vitamin B12 replacement. Monthly vitamin B12 injections are the treatment of choice to correct vitamin B12 deficiency and may be given by the patient. This therapy corrects the anemia and it may correct the neurological complications if given early enough. About 1% of vitamin B12 is absorbed (even in   the absence of intrinsic factor) so some caregivers recommend that elderly patients with gastric atrophy take oral vitamin B12 supplements in addition to monthly injections.   Some symptoms should start to clear up in a few days after treatment begins but other symptoms may take several months.   Additionally, other conditions which may lead to a deficiency should be treated.   Stop drinking alcohol  if alcoholism led to the vitamin B12 deficiency.   For other patients, the vitamin may be taken by mouth or as a nasal gel (or in addition to injections).   Iron supplements may be prescribed.   Avoid taking high amounts of folic acid. It can mask the signs of vitamin B12 deficiency.   Activity may be limited until symptoms improve.   Eat a well-balanced diet.   People on strict vegetarian diets can change their diet or take vitamin B12 supplements for life.  PROGNOSIS   When caught early, the prognosis is good. Most people will do well.   Patients with this illness have a higher incidence of cancer and polyps of the stomach.   Nervous system problems may not improve if treatment does not start soon enough.  Document Released: 08/21/2003 Document Revised: 02/10/2011 Document Reviewed: 05/28/2008 [employer] Patient Information 2012 [employer]. 

## 2011-05-04 NOTE — Progress Notes (Signed)
Bobby [name] [id number] [birth year] 05/04/2011      Progress Note-Follow Up  Subjective  Chief Complaint  Chief Complaint  Patient presents with  . Injections    B12 and Testosterone  . Follow-up    HPI  Patient reports feeling better. His energy level and mood is improving. His weakness and paresthesias all seem to be steadily improving over the past week. His libido has picked up and he denies any concerning side effects. No  CP/palp/sob/GI or GU c/o. He is sleeping better and feeling more rested. No difficulty with the injection sites.   Past Medical History  Diagnosis Date  . GERD (gastroesophageal reflux disease)   . ADD (attention deficit disorder with hyperactivity)     adult  . OSA (obstructive sleep apnea)   . Obesity     morbid  . Hyperlipidemia   . Hypertension   . Thyroid disease     Hypothyroid  . Tremor     idiopathic  . Lumbar back pain     with radiculopathy  . Hypothyroid 12/28/2010  . Fatigue 04/08/2011  . Depression with anxiety 04/12/2011  . Paresthesia Of Skin 04/12/2011  . Vitamin B 12 deficiency 04/13/2011  . History of hypotestosteronemia 04/13/2011    History reviewed. No pertinent past surgical history.  Family History  Problem Relation Age of Onset  . Hypertension Mother   . Obesity Mother   . Cardiomyopathy Mother   . Arthritis Father   . Gout Father   . Hyperlipidemia Father   . Hypertension Father   . Cleft palate Father   . Other Father     disassociative fugue  . Obesity Father   . ADD / ADHD Daughter     ADHD  . Coronary artery disease Maternal Grandmother     s/p multiple MI's first one in late 31's  . Other Maternal Grandmother     CHF  . Cancer Maternal Grandfather     spinal/ smoker  . Other Maternal Grandfather     Essential tremors  . Leukemia Paternal Grandmother   . Atrial fibrillation Paternal Grandfather 88  . Hypertension Paternal Grandfather   . Other Paternal Grandfather     pacer/defibrillator     History   Social History  . Marital Status: Married    Spouse Name: N/A    Number of Children: N/A  . Years of Education: N/A   Occupational History  . Not on file.   Social History Main Topics  . Smoking status: Never Smoker   . Smokeless tobacco: Never Used  . Alcohol Use: No     Rare - 1-2x/year  . Drug Use: No  . Sexually Active: Not on file   Other Topics Concern  . Not on file   Social History Narrative  . No narrative on file    Current Outpatient Prescriptions on File Prior to Visit  Medication Sig Dispense Refill  . aspirin 81 MG tablet Take 81 mg by mouth daily.        . citalopram (CELEXA) 10 MG tablet Take 1 tablet (10 mg total) by mouth daily.  30 tablet  2  . colesevelam (WELCHOL) 625 MG tablet Take 3 tablets (1,875 mg total) by mouth 2 (two) times daily with a meal.  180 tablet  2  . cyclobenzaprine (FLEXERIL) 10 MG tablet Take 10 mg by mouth 2 (two) times daily as needed. For pain- may cause sedation       . levothyroxine (SYNTHROID, LEVOTHROID) 25 MCG  tablet Take one tablet by mouth daily  30 tablet  11  . naproxen (NAPROSYN) 375 MG tablet Take 375 mg by mouth 2 (two) times daily with meals. As needed for pain       . nitroGLYCERIN (NITROSTAT) 0.4 MG SL tablet Place 0.4 mg under the tongue every 5 (five) minutes as needed. As needed for chest pain, may repeat q 5 min X 3 doses, if no resolution go to ER       . Omega-3 Fatty Acids (FISH OIL) 1000 MG CAPS Take 1 capsule by mouth 2 (two) times daily.        . ranitidine (ZANTAC) 150 MG tablet TAKE ONE TABLET BY MOUTH EVERY DAY  30 tablet  3  . testosterone cypionate (DEPO-TESTOSTERONE) 200 MG/ML injection Inject 0.5 mLs (100 mg total) into the muscle 7 days.  10 mL  0   No current facility-administered medications on file prior to visit.    No Known Allergies  Review of Systems  Review of Systems  Constitutional: Positive for malaise/fatigue. Negative for fever.  HENT: Negative for congestion.    Eyes: Negative for discharge.  Respiratory: Negative for shortness of breath.   Cardiovascular: Negative for chest pain, palpitations and leg swelling.  Gastrointestinal: Negative for nausea, abdominal pain and diarrhea.  Genitourinary: Negative for dysuria.  Musculoskeletal: Negative for falls.  Skin: Negative for rash.  Neurological: Positive for weakness. Negative for loss of consciousness and headaches.  Endo/Heme/Allergies: Negative for polydipsia.  Psychiatric/Behavioral: Negative for depression and suicidal ideas. The patient is not nervous/anxious and does not have insomnia.     Objective  BP 149/86  Pulse 85  Temp(Src) 98.6 F (37 C) (Oral)  Ht 6\' 1"  (1.854 m)  Wt 386 lb 12.8 oz (175.451 kg)  BMI 51.03 kg/m2  SpO2 98%  Physical Exam  Physical Exam  Constitutional: He is oriented to person, place, and time and well-developed, well-nourished, and in no distress. No distress.       obese  HENT:  Head: Normocephalic and atraumatic.  Eyes: Conjunctivae are normal.  Neck: Neck supple. No thyromegaly present.  Cardiovascular: Normal rate, regular rhythm and normal heart sounds.   No murmur heard. Pulmonary/Chest: Effort normal and breath sounds normal. No respiratory distress.  Abdominal: He exhibits no distension and no mass. There is no tenderness.  Musculoskeletal: He exhibits no edema.  Neurological: He is alert and oriented to person, place, and time.  Skin: Skin is warm.  Psychiatric: Memory, affect and judgment normal.    Lab Results  Component Value Date   TSH 3.16 04/08/2011   Lab Results  Component Value Date   WBC 6.5 04/08/2011   HGB 13.0 04/08/2011   HCT 40.4 04/08/2011   MCV 85.2 04/08/2011   PLT 321 04/08/2011   Lab Results  Component Value Date   CREATININE 1.0 04/08/2011   BUN 15 04/08/2011   NA 139 04/08/2011   K 4.1 04/08/2011   CL 106 04/08/2011   CO2 24 04/08/2011   Lab Results  Component Value Date   ALT 20 04/08/2011   AST  15 04/08/2011   ALKPHOS 51 04/08/2011   BILITOT 0.8 04/08/2011   Lab Results  Component Value Date   CHOL 191 04/08/2011   Lab Results  Component Value Date   HDL 35.20* 04/08/2011   Lab Results  Component Value Date   LDLCALC 119* 04/08/2011   Lab Results  Component Value Date   TRIG 184.0* 04/08/2011   Lab  Results  Component Value Date   CHOLHDL 5 04/08/2011     Assessment & Plan   Vitamin B 12 deficiency He reports his paresthesias and weakness are slowly improving. No difficulty with the injections. Will repeat another shot next week and then drop them to every other week and recheck a level.  SLEEP APNEA Continues to use his CPAP routinely  Depression with anxiety Tolerating the Citalopram does note his mood is improving  ELEVATED BP READING WITHOUT DX HYPERTENSION Mild elevation today. Minimize sodium and continue to monitor  History of hypotestosteronemia Tolerating Testosterone injections, will continue with weekly x one more dose then drop to every other week and recheck level.  Hypothyroid Tolerating Levothyroxine at 25 mcg will recheck thyroid level with next blood draw

## 2011-05-04 NOTE — Assessment & Plan Note (Signed)
Tolerating Testosterone injections, will continue with weekly x one more dose then drop to every other week and recheck level.

## 2011-05-04 NOTE — Assessment & Plan Note (Signed)
Tolerating Levothyroxine at 25 mcg will recheck thyroid level with next blood draw

## 2011-05-10 ENCOUNTER — Encounter: Payer: Self-pay | Admitting: *Deleted

## 2011-05-11 ENCOUNTER — Ambulatory Visit (INDEPENDENT_AMBULATORY_CARE_PROVIDER_SITE_OTHER): Payer: Commercial Indemnity

## 2011-05-11 DIAGNOSIS — E538 Deficiency of other specified B group vitamins: Secondary | ICD-10-CM

## 2011-05-11 DIAGNOSIS — E291 Testicular hypofunction: Secondary | ICD-10-CM

## 2011-05-11 MED ORDER — CYANOCOBALAMIN 1000 MCG/ML IJ SOLN
1000.0000 ug | Freq: Once | INTRAMUSCULAR | Status: AC
  Administered 2011-05-11: 1000 ug via INTRAMUSCULAR

## 2011-05-11 MED ORDER — TESTOSTERONE CYPIONATE 200 MG/ML IM SOLN
100.0000 mg | INTRAMUSCULAR | Status: DC
  Administered 2011-05-11: 100 mg via INTRAMUSCULAR

## 2011-05-18 ENCOUNTER — Ambulatory Visit: Payer: Commercial Indemnity

## 2011-05-18 ENCOUNTER — Other Ambulatory Visit: Payer: Commercial Indemnity

## 2011-05-25 ENCOUNTER — Ambulatory Visit: Payer: Commercial Indemnity

## 2011-05-25 ENCOUNTER — Other Ambulatory Visit (INDEPENDENT_AMBULATORY_CARE_PROVIDER_SITE_OTHER): Payer: Commercial Indemnity

## 2011-05-25 DIAGNOSIS — E538 Deficiency of other specified B group vitamins: Secondary | ICD-10-CM

## 2011-05-25 DIAGNOSIS — E039 Hypothyroidism, unspecified: Secondary | ICD-10-CM

## 2011-05-25 DIAGNOSIS — E291 Testicular hypofunction: Secondary | ICD-10-CM

## 2011-05-25 DIAGNOSIS — E349 Endocrine disorder, unspecified: Secondary | ICD-10-CM

## 2011-05-25 LAB — TSH: TSH: 3.01 u[IU]/mL (ref 0.35–5.50)

## 2011-05-25 LAB — TESTOSTERONE: Testosterone: 363.81 ng/dL (ref 350.00–890.00)

## 2011-05-25 LAB — VITAMIN B12: Vitamin B-12: >1500 pg/mL — ABNORMAL HIGH (ref 211–911)

## 2011-05-25 MED ORDER — CYANOCOBALAMIN 1000 MCG/ML IJ SOLN
1000.0000 ug | Freq: Once | INTRAMUSCULAR | Status: AC
  Administered 2011-05-25: 1000 ug via INTRAMUSCULAR

## 2011-05-25 MED ORDER — TESTOSTERONE CYPIONATE 200 MG/ML IM SOLN
100.0000 mg | INTRAMUSCULAR | Status: DC
  Administered 2011-05-25: 100 mg via INTRAMUSCULAR

## 2011-06-04 ENCOUNTER — Ambulatory Visit: Payer: Commercial Indemnity | Admitting: Family Medicine

## 2011-06-05 ENCOUNTER — Other Ambulatory Visit: Payer: Self-pay | Admitting: Family Medicine

## 2011-06-07 NOTE — Telephone Encounter (Signed)
Last visit 04/2011.  Follow up on 12/28.  RX sent.

## 2011-06-11 ENCOUNTER — Ambulatory Visit (INDEPENDENT_AMBULATORY_CARE_PROVIDER_SITE_OTHER): Payer: Commercial Indemnity | Admitting: Family Medicine

## 2011-06-11 DIAGNOSIS — R5383 Other fatigue: Secondary | ICD-10-CM

## 2011-06-11 DIAGNOSIS — E785 Hyperlipidemia, unspecified: Secondary | ICD-10-CM

## 2011-06-11 DIAGNOSIS — Z862 Personal history of diseases of the blood and blood-forming organs and certain disorders involving the immune mechanism: Secondary | ICD-10-CM

## 2011-06-11 DIAGNOSIS — E349 Endocrine disorder, unspecified: Secondary | ICD-10-CM

## 2011-06-11 DIAGNOSIS — F418 Other specified anxiety disorders: Secondary | ICD-10-CM

## 2011-06-11 DIAGNOSIS — F341 Dysthymic disorder: Secondary | ICD-10-CM

## 2011-06-11 DIAGNOSIS — I10 Essential (primary) hypertension: Secondary | ICD-10-CM

## 2011-06-11 DIAGNOSIS — E039 Hypothyroidism, unspecified: Secondary | ICD-10-CM

## 2011-06-11 DIAGNOSIS — L723 Sebaceous cyst: Secondary | ICD-10-CM

## 2011-06-11 DIAGNOSIS — J329 Chronic sinusitis, unspecified: Secondary | ICD-10-CM

## 2011-06-11 DIAGNOSIS — R259 Unspecified abnormal involuntary movements: Secondary | ICD-10-CM

## 2011-06-11 DIAGNOSIS — R5381 Other malaise: Secondary | ICD-10-CM

## 2011-06-11 DIAGNOSIS — E538 Deficiency of other specified B group vitamins: Secondary | ICD-10-CM

## 2011-06-11 DIAGNOSIS — E291 Testicular hypofunction: Secondary | ICD-10-CM

## 2011-06-11 DIAGNOSIS — Z8639 Personal history of other endocrine, nutritional and metabolic disease: Secondary | ICD-10-CM

## 2011-06-11 MED ORDER — "SYRINGE 25G X 1"" 3 ML MISC": Status: DC

## 2011-06-11 MED ORDER — METOPROLOL SUCCINATE ER 25 MG PO TB24: 25.0000 mg | ORAL_TABLET | Freq: Every day | ORAL | Status: DC

## 2011-06-11 MED ORDER — BENZONATATE 100 MG PO CAPS: 100.0000 mg | ORAL_CAPSULE | Freq: Two times a day (BID) | ORAL | Status: AC | PRN

## 2011-06-11 MED ORDER — CYANOCOBALAMIN 1000 MCG/ML IJ SOLN
1000.0000 ug | Freq: Once | INTRAMUSCULAR | Status: AC
  Administered 2011-06-11: 1000 ug via INTRAMUSCULAR

## 2011-06-11 MED ORDER — TESTOSTERONE CYPIONATE 200 MG/ML IM SOLN
200.0000 mg | INTRAMUSCULAR | Status: DC
  Administered 2011-06-11: 200 mg via INTRAMUSCULAR

## 2011-06-11 MED ORDER — TESTOSTERONE CYPIONATE 200 MG/ML IM SOLN: 200.0000 mg | INTRAMUSCULAR | Status: DC

## 2011-06-11 MED ORDER — ALCOHOL PREP SWABS PADS: MEDICATED_PAD | Status: DC

## 2011-06-11 MED ORDER — "SYRINGE 22G X 1-1/2"" 3 ML MISC": Status: DC

## 2011-06-11 MED ORDER — TESTOSTERONE CYPIONATE 200 MG/ML IM SOLN: 100.0000 mg | INTRAMUSCULAR | Status: DC

## 2011-06-11 MED ORDER — AZITHROMYCIN 250 MG PO TABS: ORAL_TABLET | ORAL | Status: AC

## 2011-06-11 MED ORDER — CYANOCOBALAMIN 1000 MCG/ML IJ SOLN: 1000.0000 ug | INTRAMUSCULAR | Status: AC

## 2011-06-11 NOTE — Patient Instructions (Addendum)
Hypertension As your heart beats, it forces blood through your arteries. This force is your blood pressure. If the pressure is too high, it is called hypertension (HTN) or high blood pressure. HTN is dangerous because you may have it and not know it. High blood pressure may mean that your heart has to work harder to pump blood. Your arteries may be narrow or stiff. The extra work puts you at risk for heart disease, stroke, and other problems.  Blood pressure consists of two numbers, a higher number over a lower, 110/72, for example. It is stated as "110 over 72." The ideal is below 120 for the top number (systolic) and under 80 for the bottom (diastolic). Write down your blood pressure today. You should pay close attention to your blood pressure if you have certain conditions such as:  Heart failure.   Prior heart attack.   Diabetes   Chronic kidney disease.   Prior stroke.   Multiple risk factors for heart disease.  To see if you have HTN, your blood pressure should be measured while you are seated with your arm held at the level of the heart. It should be measured at least twice. A one-time elevated blood pressure reading (especially in the Emergency Department) does not mean that you need treatment. There may be conditions in which the blood pressure is different between your right and left arms. It is important to see your caregiver soon for a recheck. Most people have essential hypertension which means that there is not a specific cause. This type of high blood pressure may be lowered by changing lifestyle factors such as:  Stress.   Smoking.   Lack of exercise.   Excessive weight.   Drug/tobacco/alcohol use.   Eating less salt.  Most people do not have symptoms from high blood pressure until it has caused damage to the body. Effective treatment can often prevent, delay or reduce that damage. TREATMENT  When a cause has been identified, treatment for high blood pressure is  directed at the cause. There are a large number of medications to treat HTN. These fall into several categories, and your caregiver will help you select the medicines that are best for you. Medications may have side effects. You should review side effects with your caregiver. If your blood pressure stays high after you have made lifestyle changes or started on medicines,   Your medication(s) may need to be changed.   Other problems may need to be addressed.   Be certain you understand your prescriptions, and know how and when to take your medicine.   Be sure to follow up with your caregiver within the time frame advised (usually within two weeks) to have your blood pressure rechecked and to review your medications.   If you are taking more than one medicine to lower your blood pressure, make sure you know how and at what times they should be taken. Taking two medicines at the same time can result in blood pressure that is too low.  SEEK IMMEDIATE MEDICAL CARE IF:  You develop a severe headache, blurred or changing vision, or confusion.   You have unusual weakness or numbness, or a faint feeling.   You have severe chest or abdominal pain, vomiting, or breathing problems.  MAKE SURE YOU:   Understand these instructions.   Will watch your condition.   Will get help right away if you are not doing well or get worse.  Document Released: 05/31/2005 Document Revised: 02/10/2011 Document Reviewed:   01/19/2008 [employer] Patient Information 2012 [place], [place].Lipoma A lipoma is a noncancerous (benign) tumor composed of fat cells. They are usually found under the skin (subcutaneous). A lipoma may occur in any tissue of the body that contains fat. Common areas for lipomas to appear include the back, shoulders, buttocks, and thighs. Lipomas are a very common soft tissue growth. They are soft and grow slowly. Most problems caused by a lipoma depend on where it is growing. DIAGNOSIS  A lipoma can be  diagnosed with a physical exam. These tumors rarely become cancerous, but radiographic studies can help determine this for certain. Studies used may include:  Computerized X-ray scans (CT or CAT scan).   Computerized magnetic scans (MRI).  TREATMENT  Small lipomas that are not causing problems may be watched. If a lipoma continues to enlarge or causes problems, removal is often the best treatment. Lipomas can also be removed to improve appearance. Surgery is done to remove the fatty cells and the surrounding capsule. Most often, this is done with medicine that numbs the area (local anesthetic). The removed tissue is examined under a microscope to make sure it is not cancerous. Keep all follow-up appointments with your caregiver. SEEK MEDICAL CARE IF:   The lipoma becomes larger or hard.   The lipoma becomes painful, red, or increasingly swollen. These could be signs of infection or a more serious condition.  Document Released: 05/21/2002 Document Revised: 02/10/2011 Document Reviewed: 10/31/2009 [facility] Patient Information 2012 [place], [place].

## 2011-06-11 NOTE — Progress Notes (Signed)
Patient ID: Bobby [name], male   DOB: January 18, 1976, 35 y.o.   MRN: [id number] Joyce Leckey [id number] August 19, 1975 06/11/2011      Progress Note-Follow Up  Subjective  Chief Complaint  Chief Complaint  Patient presents with  . discuss lab results    HPI  35 year old Caucasian male who is in today for followup on multiple medical problems. Overall he is feeling better. His fatigue is slowly improving as we treat his sleep apnea and his low testosterone. It is still present but improved somewhat. He does have some nasal congestion today but feels it is improved since last week. Last week he had significant URI symptoms with congestion, cough, myalgias and malaise. No fevers or chills. He has a small amount of postnasal drip but overall is improved. Taking Mucinex over-the-counter. No recent chest pain, palpitations, shortness of breath, GI or GU complaints. He does continue to struggle with low grade generalized tremors fluctuate in intensity but present to some degree always. He does feel the citalopram. His mood and his depression has lifted to a considerable degree  Past Medical History  Diagnosis Date  . GERD (gastroesophageal reflux disease)   . ADD (attention deficit disorder with hyperactivity)     adult  . OSA (obstructive sleep apnea)   . Obesity     morbid  . Hyperlipidemia   . Hypertension   . Thyroid disease     Hypothyroid  . Tremor     idiopathic  . Lumbar back pain     with radiculopathy  . Hypothyroid 12/28/2010  . Fatigue 04/08/2011  . Depression with anxiety 04/12/2011  . Paresthesia Of Skin 04/12/2011  . Vitamin B 12 deficiency 04/13/2011  . History of hypotestosteronemia 04/13/2011    No past surgical history on file.  Family History  Problem Relation Age of Onset  . Hypertension Mother   . Obesity Mother   . Cardiomyopathy Mother   . Arthritis Father   . Gout Father   . Hyperlipidemia Father   . Hypertension Father   . Cleft palate Father   . Other  Father     disassociative fugue  . Obesity Father   . ADD / ADHD Daughter     ADHD  . Coronary artery disease Maternal Grandmother     s/p multiple MI's first one in late 58's  . Other Maternal Grandmother     CHF  . Cancer Maternal Grandfather     spinal/ smoker  . Other Maternal Grandfather     Essential tremors  . Leukemia Paternal Grandmother   . Atrial fibrillation Paternal Grandfather 15  . Hypertension Paternal Grandfather   . Other Paternal Grandfather     pacer/defibrillator    History   Social History  . Marital Status: Married    Spouse Name: N/A    Number of Children: N/A  . Years of Education: N/A   Occupational History  . Not on file.   Social History Main Topics  . Smoking status: Never Smoker   . Smokeless tobacco: Never Used  . Alcohol Use: No     Rare - 1-2x/year  . Drug Use: No  . Sexually Active: Not on file   Other Topics Concern  . Not on file   Social History Narrative  . No narrative on file    Current Outpatient Prescriptions on File Prior to Visit  Medication Sig Dispense Refill  . aspirin 81 MG tablet Take 81 mg by mouth daily.        Marland Kitchen  citalopram (CELEXA) 10 MG tablet Take 1 tablet (10 mg total) by mouth daily.  30 tablet  2  . colesevelam (WELCHOL) 625 MG tablet Take 3 tablets (1,875 mg total) by mouth 2 (two) times daily with a meal.  180 tablet  2  . cyclobenzaprine (FLEXERIL) 10 MG tablet Take 10 mg by mouth 2 (two) times daily as needed. For pain- may cause sedation       . levothyroxine (SYNTHROID, LEVOTHROID) 25 MCG tablet Take one tablet by mouth daily  30 tablet  11  . naproxen (NAPROSYN) 375 MG tablet Take 375 mg by mouth 2 (two) times daily with meals. As needed for pain       . nitroGLYCERIN (NITROSTAT) 0.4 MG SL tablet Place 0.4 mg under the tongue every 5 (five) minutes as needed. As needed for chest pain, may repeat q 5 min X 3 doses, if no resolution go to ER       . Omega-3 Fatty Acids (FISH OIL) 1000 MG CAPS Take 1  capsule by mouth 2 (two) times daily.        . ranitidine (ZANTAC) 150 MG tablet TAKE ONE TABLET BY MOUTH EVERY DAY  30 tablet  6   No current facility-administered medications on file prior to visit.    No Known Allergies  Review of Systems  Review of Systems  Constitutional: Negative for fever and malaise/fatigue.  HENT: Negative for congestion.   Eyes: Negative for discharge.  Respiratory: Negative for shortness of breath.   Cardiovascular: Negative for chest pain, palpitations and leg swelling.  Gastrointestinal: Negative for nausea, abdominal pain and diarrhea.  Genitourinary: Negative for dysuria.  Musculoskeletal: Negative for falls.  Skin: Negative for rash.  Neurological: Negative for loss of consciousness and headaches.  Endo/Heme/Allergies: Negative for polydipsia.  Psychiatric/Behavioral: Negative for depression and suicidal ideas. The patient is not nervous/anxious and does not have insomnia.     Objective  BP 148/87  Pulse 77  Temp(Src) 97.2 F (36.2 C) (Temporal)  Ht 6\' 1"  (1.854 m)  Wt 394 lb (178.717 kg)  BMI 51.98 kg/m2  SpO2 98%  Physical Exam  Physical Exam  Constitutional: He is oriented to person, place, and time and well-developed, well-nourished, and in no distress. No distress.  HENT:  Head: Normocephalic and atraumatic.  Eyes: Conjunctivae are normal.  Neck: Neck supple. No thyromegaly present.  Cardiovascular: Normal rate, regular rhythm and normal heart sounds.   No murmur heard. Pulmonary/Chest: Effort normal and breath sounds normal. No respiratory distress.  Abdominal: He exhibits no distension and no mass. There is no tenderness.  Musculoskeletal: He exhibits no edema.  Neurological: He is alert and oriented to person, place, and time.  Skin: Skin is warm.  Psychiatric: Memory, affect and judgment normal.    Lab Results  Component Value Date   TSH 3.01 05/25/2011   Lab Results  Component Value Date   WBC 6.5 04/08/2011   HGB  13.0 04/08/2011   HCT 40.4 04/08/2011   MCV 85.2 04/08/2011   PLT 321 04/08/2011   Lab Results  Component Value Date   CREATININE 1.0 04/08/2011   BUN 15 04/08/2011   NA 139 04/08/2011   K 4.1 04/08/2011   CL 106 04/08/2011   CO2 24 04/08/2011   Lab Results  Component Value Date   ALT 20 04/08/2011   AST 15 04/08/2011   ALKPHOS 51 04/08/2011   BILITOT 0.8 04/08/2011   Lab Results  Component Value Date   CHOL  191 04/08/2011   Lab Results  Component Value Date   HDL 35.20* 04/08/2011   Lab Results  Component Value Date   LDLCALC 119* 04/08/2011   Lab Results  Component Value Date   TRIG 184.0* 04/08/2011   Lab Results  Component Value Date   CHOLHDL 5 04/08/2011     Assessment & Plan  SEBACEOUS CYST, NECK This is on his posterior spine and is recurrent. It is starting to bother him some and he is considering removal. He will notify us if he would like referral for removal.  History of hypotestosteronemia Numbers now normalized and patient and his wife have come in for teaching, we will continue with current dosing every other week and we will recheck levels in 3 months.  Hyperlipidemia Encouraged avoidance of trans fats and continued increased activity. Continue fish oil  Hypothyroid Well treated, no changes today  Fatigue Improving  Depression with anxiety Improved with Citalopram no changes   IDIOPATHIC TREMOR Will start Metoprolol to address elevated bp as well, if no improvement will refer to neurology for further evaluation  Vitamin B 12 deficiency Tolerating shots, will continue with monthly shots of Vitamin B 12

## 2011-06-13 NOTE — Assessment & Plan Note (Signed)
Improved with Citalopram no changes

## 2011-06-13 NOTE — Assessment & Plan Note (Signed)
Improving.

## 2011-06-13 NOTE — Assessment & Plan Note (Signed)
Will start Metoprolol to address elevated bp as well, if no improvement will refer to neurology for further evaluation

## 2011-06-13 NOTE — Assessment & Plan Note (Signed)
Tolerating shots, will continue with monthly shots of Vitamin B 12

## 2011-06-13 NOTE — Assessment & Plan Note (Signed)
Well treated, no changes today 

## 2011-06-13 NOTE — Assessment & Plan Note (Signed)
Encouraged avoidance of trans fats and continued increased activity. Continue fish oil

## 2011-06-13 NOTE — Assessment & Plan Note (Signed)
Numbers now normalized and patient and his wife have come in for teaching, we will continue with current dosing every other week and we will recheck levels in 3 months.

## 2011-06-13 NOTE — Assessment & Plan Note (Signed)
This is on his posterior spine and is recurrent. It is starting to bother him some and he is considering removal. He will notify us if he would like referral for removal.

## 2011-07-13 ENCOUNTER — Other Ambulatory Visit: Payer: Self-pay | Admitting: Family Medicine

## 2011-07-16 ENCOUNTER — Ambulatory Visit (INDEPENDENT_AMBULATORY_CARE_PROVIDER_SITE_OTHER): Payer: Commercial Indemnity | Admitting: *Deleted

## 2011-07-16 DIAGNOSIS — E538 Deficiency of other specified B group vitamins: Secondary | ICD-10-CM

## 2011-07-16 MED ORDER — CYANOCOBALAMIN 1000 MCG/ML IJ SOLN
1000.0000 ug | Freq: Once | INTRAMUSCULAR | Status: AC
  Administered 2011-07-16: 1000 ug via INTRAMUSCULAR

## 2011-07-16 NOTE — Progress Notes (Signed)
Pt tolerated injection well.  He will continue with at home injections.

## 2011-10-07 ENCOUNTER — Other Ambulatory Visit: Payer: Self-pay | Admitting: Family Medicine

## 2011-10-12 ENCOUNTER — Ambulatory Visit: Payer: Commercial Indemnity | Admitting: Family Medicine

## 2011-10-13 ENCOUNTER — Encounter: Payer: Self-pay | Admitting: Family Medicine

## 2011-10-13 ENCOUNTER — Ambulatory Visit: Payer: Commercial Indemnity | Admitting: Family Medicine

## 2011-10-13 ENCOUNTER — Ambulatory Visit (INDEPENDENT_AMBULATORY_CARE_PROVIDER_SITE_OTHER): Payer: Commercial Indemnity | Admitting: Family Medicine

## 2011-10-13 VITALS — BP 137/91 | HR 60 | Temp 97.8°F | Ht 73.0 in | Wt 396.8 lb

## 2011-10-13 DIAGNOSIS — F418 Other specified anxiety disorders: Secondary | ICD-10-CM

## 2011-10-13 DIAGNOSIS — Z8639 Personal history of other endocrine, nutritional and metabolic disease: Secondary | ICD-10-CM

## 2011-10-13 DIAGNOSIS — D510 Vitamin B12 deficiency anemia due to intrinsic factor deficiency: Secondary | ICD-10-CM

## 2011-10-13 DIAGNOSIS — F32A Depression, unspecified: Secondary | ICD-10-CM

## 2011-10-13 DIAGNOSIS — IMO0002 Reserved for concepts with insufficient information to code with codable children: Secondary | ICD-10-CM

## 2011-10-13 DIAGNOSIS — F329 Major depressive disorder, single episode, unspecified: Secondary | ICD-10-CM

## 2011-10-13 DIAGNOSIS — E785 Hyperlipidemia, unspecified: Secondary | ICD-10-CM

## 2011-10-13 DIAGNOSIS — Z862 Personal history of diseases of the blood and blood-forming organs and certain disorders involving the immune mechanism: Secondary | ICD-10-CM

## 2011-10-13 DIAGNOSIS — F341 Dysthymic disorder: Secondary | ICD-10-CM

## 2011-10-13 DIAGNOSIS — E538 Deficiency of other specified B group vitamins: Secondary | ICD-10-CM

## 2011-10-13 DIAGNOSIS — I10 Essential (primary) hypertension: Secondary | ICD-10-CM

## 2011-10-13 HISTORY — PX: CYSTECTOMY: SUR359

## 2011-10-13 HISTORY — DX: Reserved for concepts with insufficient information to code with codable children: IMO0002

## 2011-10-13 LAB — TSH: TSH: 2.4 u[IU]/mL (ref 0.35–5.50)

## 2011-10-13 LAB — LIPID PANEL: Triglycerides: 192 mg/dL — ABNORMAL HIGH (ref 0.0–149.0)

## 2011-10-13 LAB — HEPATIC FUNCTION PANEL
ALT: 21 U/L (ref 0–53)
Albumin: 4.3 g/dL (ref 3.5–5.2)
Total Bilirubin: 0.8 mg/dL (ref 0.3–1.2)

## 2011-10-13 LAB — VITAMIN B12: Vitamin B-12: 309 pg/mL (ref 211–911)

## 2011-10-13 LAB — RENAL FUNCTION PANEL
Calcium: 8.9 mg/dL (ref 8.4–10.5)
Creatinine, Ser: 1 mg/dL (ref 0.4–1.5)
Glucose, Bld: 96 mg/dL (ref 70–99)
Sodium: 140 mEq/L (ref 135–145)

## 2011-10-13 LAB — LDL CHOLESTEROL, DIRECT: Direct LDL: 144.6 mg/dL

## 2011-10-13 LAB — CBC
HCT: 39.9 % (ref 39.0–52.0)
MCV: 85.4 fl (ref 78.0–100.0)
RBC: 4.67 Mil/uL (ref 4.22–5.81)
RDW: 16.3 % — ABNORMAL HIGH (ref 11.5–14.6)

## 2011-10-13 MED ORDER — CEFDINIR 300 MG PO CAPS: 300.0000 mg | ORAL_CAPSULE | Freq: Two times a day (BID) | ORAL | Status: AC

## 2011-10-13 MED ORDER — "SYRINGE 22G X 1-1/2"" 3 ML MISC": Status: DC

## 2011-10-13 MED ORDER — "SYRINGE 25G X 1"" 3 ML MISC": Status: DC

## 2011-10-13 MED ORDER — NEBIVOLOL HCL 5 MG PO TABS: 2.5000 mg | ORAL_TABLET | Freq: Every day | ORAL | Status: DC

## 2011-10-13 MED ORDER — CITALOPRAM HYDROBROMIDE 20 MG PO TABS: 20.0000 mg | ORAL_TABLET | Freq: Every day | ORAL | Status: DC

## 2011-10-13 MED ORDER — ALIGN PO CAPS: 1.0000 | ORAL_CAPSULE | Freq: Every day | ORAL | Status: DC

## 2011-10-13 NOTE — Assessment & Plan Note (Addendum)
Increase SSRI today and reevaluate at next visit.

## 2011-10-13 NOTE — Progress Notes (Signed)
Patient ID: Bobby [name], male   DOB: Dec 29, 1975, 36 y.o.   MRN: [id number] Bobby [name] [id number] 03-07-1976 10/13/2011      Progress Note-Follow Up  Subjective  Chief Complaint  Chief Complaint  Patient presents with  . ingrown toenails    (2) on right foot, and labs  . Cyst    on neck- maybe a referral    HPI  Patient is a 36 year old Caucasian male who is in today for evaluation of toe infections as well as fatigue. He has had years worth of trouble with his toenails on his right foot developing recurrent paronychia. He had to have part of his large toenail removed in the past. Over the last several years she said her with the second and fourth toe on the right foot. At this point they're swollen, painful and struggling with discharge. He denies spreading of the infection fevers malaise. He does not since dropping his testosterone to once monthly he said increased fatigue. He also acknowledges increased depression and irritability in the last one to 2 months. Denies chest pain, palpitations, increased shortness of breath GI or GU complaints otherwise.2  Past Medical History  Diagnosis Date  . GERD (gastroesophageal reflux disease)   . ADD (attention deficit disorder with hyperactivity)     adult  . OSA (obstructive sleep apnea)   . Obesity     morbid  . Hyperlipidemia   . Hypertension   . Thyroid disease     Hypothyroid  . Tremor     idiopathic  . Lumbar back pain     with radiculopathy  . Hypothyroid 12/28/2010  . Fatigue 04/08/2011  . Depression with anxiety 04/12/2011  . Paresthesia Of Skin 04/12/2011  . Vitamin B 12 deficiency 04/13/2011  . History of hypotestosteronemia 04/13/2011  . Paronychia 10/13/2011    History reviewed. No pertinent past surgical history.  Family History  Problem Relation Age of Onset  . Hypertension Mother   . Obesity Mother   . Cardiomyopathy Mother   . Arthritis Father   . Gout Father   . Hyperlipidemia Father   . Hypertension  Father   . Cleft palate Father   . Other Father     disassociative fugue  . Obesity Father   . ADD / ADHD Daughter     ADHD  . Coronary artery disease Maternal Grandmother     s/p multiple MI's first one in late 57's  . Other Maternal Grandmother     CHF  . Cancer Maternal Grandfather     spinal/ smoker  . Other Maternal Grandfather     Essential tremors  . Leukemia Paternal Grandmother   . Atrial fibrillation Paternal Grandfather 71  . Hypertension Paternal Grandfather   . Other Paternal Grandfather     pacer/defibrillator    History   Social History  . Marital Status: Married    Spouse Name: N/A    Number of Children: N/A  . Years of Education: N/A   Occupational History  . Not on file.   Social History Main Topics  . Smoking status: Never Smoker   . Smokeless tobacco: Never Used  . Alcohol Use: No     Rare - 1-2x/year  . Drug Use: No  . Sexually Active: Not on file   Other Topics Concern  . Not on file   Social History Narrative  . No narrative on file    Current Outpatient Prescriptions on File Prior to Visit  Medication Sig Dispense Refill  .  Alcohol Swabs (ALCOHOL PREP SWABS) PADS Use as directed before injections  1 each  1  . aspirin 81 MG tablet Take 81 mg by mouth daily.        . cyanocobalamin (,VITAMIN B-12,) 1000 MCG/ML injection Inject 1 mL (1,000 mcg total) into the muscle every 30 (thirty) days.  10 mL  3  . levothyroxine (SYNTHROID, LEVOTHROID) 25 MCG tablet Take one tablet by mouth daily  30 tablet  11  . naproxen (NAPROSYN) 375 MG tablet Take 375 mg by mouth 2 (two) times daily with meals. As needed for pain       . Omega-3 Fatty Acids (FISH OIL) 1000 MG CAPS Take 1 capsule by mouth 2 (two) times daily.        . ranitidine (ZANTAC) 150 MG tablet TAKE ONE TABLET BY MOUTH EVERY DAY  30 tablet  6  . colesevelam (WELCHOL) 625 MG tablet Take 3 tablets (1,875 mg total) by mouth 2 (two) times daily with a meal.  180 tablet  2  . cyclobenzaprine  (FLEXERIL) 10 MG tablet Take 10 mg by mouth 2 (two) times daily as needed. For pain- may cause sedation       . nebivolol (BYSTOLIC) 5 MG tablet Take 0.5 tablets (2.5 mg total) by mouth daily.  35 tablet    . nitroGLYCERIN (NITROSTAT) 0.4 MG SL tablet Place 0.4 mg under the tongue every 5 (five) minutes as needed. As needed for chest pain, may repeat q 5 min X 3 doses, if no resolution go to ER       . testosterone cypionate (DEPO-TESTOSTERONE) 200 MG/ML injection Inject 0.5 mLs (100 mg total) into the muscle 7 days.  10 mL  0  . testosterone cypionate (DEPO-TESTOSTERONE) 200 MG/ML injection Inject 0.5 mLs (100 mg total) into the muscle every 14 (fourteen) days.  10 mL  3    No Known Allergies  Review of Systems  Review of Systems  Constitutional: Positive for malaise/fatigue. Negative for fever.  HENT: Negative for congestion.   Eyes: Negative for discharge.  Respiratory: Negative for shortness of breath.   Cardiovascular: Negative for chest pain, palpitations and leg swelling.  Gastrointestinal: Negative for nausea, abdominal pain and diarrhea.  Genitourinary: Negative for dysuria.  Musculoskeletal: Negative for falls.       2nd and 4th toe pain, along nail beds  Skin: Negative for rash.  Neurological: Negative for loss of consciousness and headaches.  Endo/Heme/Allergies: Negative for polydipsia.  Psychiatric/Behavioral: Positive for depression. Negative for suicidal ideas. The patient is nervous/anxious. The patient does not have insomnia.     Objective  BP 137/91  Pulse 60  Temp(Src) 97.8 F (36.6 C) (Temporal)  Ht 6\' 1"  (1.854 m)  Wt 396 lb 12.8 oz (179.987 kg)  BMI 52.35 kg/m2  SpO2 98%  Physical Exam  Physical Exam  Constitutional: He is oriented to person, place, and time and well-developed, well-nourished, and in no distress. No distress.  HENT:  Head: Normocephalic and atraumatic.  Eyes: Conjunctivae are normal.  Neck: Neck supple. No thyromegaly present.    Cardiovascular: Normal rate, regular rhythm and normal heart sounds.   Pulmonary/Chest: Effort normal and breath sounds normal. No respiratory distress.  Abdominal: He exhibits no distension and no mass. There is no tenderness.  Musculoskeletal: He exhibits no edema.  Neurological: He is alert and oriented to person, place, and time.  Skin: Skin is warm. Rash noted. There is erythema.       Swelling, pain, redness  with serous drainage from lesions along nail beds of 2nd and 4 th right toes  Psychiatric: Memory, affect and judgment normal.    Lab Results  Component Value Date   TSH 2.40 10/13/2011   Lab Results  Component Value Date   WBC 7.6 10/13/2011   HGB 13.1 10/13/2011   HCT 39.9 10/13/2011   MCV 85.4 10/13/2011   PLT 277.0 10/13/2011   Lab Results  Component Value Date   CREATININE 1.0 10/13/2011   BUN 18 10/13/2011   NA 140 10/13/2011   K 4.5 10/13/2011   CL 106 10/13/2011   CO2 26 10/13/2011   Lab Results  Component Value Date   ALT 21 10/13/2011   AST 18 10/13/2011   ALKPHOS 57 10/13/2011   BILITOT 0.8 10/13/2011   Lab Results  Component Value Date   CHOL 219* 10/13/2011   Lab Results  Component Value Date   HDL 35.30* 10/13/2011   Lab Results  Component Value Date   LDLCALC 119* 04/08/2011   Lab Results  Component Value Date   TRIG 192.0* 10/13/2011   Lab Results  Component Value Date   CHOLHDL 6 10/13/2011     Assessment & Plan   Vitamin B 12 deficiency Repeat levels today within normal limits, no change to therapy  History of hypotestosteronemia Levels have dropped again, will increase shots to every other week again, given refill on syringes today  Paronychia Cleansed with peroxide and covered with antibiotic ointment and bandaid, started on Cefdinir and probiotics. He is to saok his toes in h2o2 and h2o nightly then place sterile cotton between nail and nail bed. If no improvement will need referral to podiatry for permanent treatment  Depression with anxiety Increase  SSRI today and reevaluate at next visit.  HTN (hypertension) Did not tolerate Metoprolol mad him feel very nauseous. He is given samples of Bystolic 5 mg tabs to take a 1/2 tab daily

## 2011-10-13 NOTE — Assessment & Plan Note (Signed)
Did not tolerate Metoprolol mad him feel very nauseous. He is given samples of Bystolic 5 mg tabs to take a 1/2 tab daily

## 2011-10-13 NOTE — Assessment & Plan Note (Signed)
Cleansed with peroxide and covered with antibiotic ointment and bandaid, started on Cefdinir and probiotics. He is to saok his toes in h2o2 and h2o nightly then place sterile cotton between nail and nail bed. If no improvement will need referral to podiatry for permanent treatment

## 2011-10-13 NOTE — Assessment & Plan Note (Addendum)
Repeat levels today within normal limits, no change to therapy

## 2011-10-13 NOTE — Patient Instructions (Addendum)
Paronychia Paronychia is an inflammatory reaction involving the folds of the skin surrounding the fingernail. This is commonly caused by an infection in the skin around a nail. The most common cause of paronychia is frequent wetting of the hands (as seen with bartenders, food servers, nurses or others who wet their hands). This makes the skin around the fingernail susceptible to infection by bacteria (germs) or fungus. Other predisposing factors are:  Aggressive manicuring.   Nail biting.   Thumb sucking.  The most common cause is a staphylococcal (a type of germ) infection, or a fungal (Candida) infection. When caused by a germ, it usually comes on suddenly with redness, swelling, pus and is often painful. It may get under the nail and form an abscess (collection of pus), or form an abscess around the nail. If the nail itself is infected with a fungus, the treatment is usually prolonged and may require oral medicine for up to one year. Your caregiver will determine the length of time treatment is required. The paronychia caused by bacteria (germs) may largely be avoided by not pulling on hangnails or picking at cuticles. When the infection occurs at the tips of the finger it is called felon. When the cause of paronychia is from the herpes simplex virus (HSV) it is called herpetic whitlow. TREATMENT  When an abscess is present treatment is often incision and drainage. This means that the abscess must be cut open so the pus can get out. When this is done, the following home care instructions should be followed. HOME CARE INSTRUCTIONS   It is important to keep the affected fingers very dry. Rubber or plastic gloves over cotton gloves should be used whenever the hand must be placed in water.   Keep wound clean, dry and dressed as suggested by your caregiver between warm soaks or warm compresses.   Soak in warm water for fifteen to twenty minutes three to four times per day for bacterial infections.  Fungal infections are very difficult to treat, so often require treatment for long periods of time.   For bacterial (germ) infections take antibiotics (medicine which kill germs) as directed and finish the prescription, even if the problem appears to be solved before the medicine is gone.   Only take over-the-counter or prescription medicines for pain, discomfort, or fever as directed by your caregiver.  SEEK IMMEDIATE MEDICAL CARE IF:  You have redness, swelling, or increasing pain in the wound.   You notice pus coming from the wound.   You have a fever.   You notice a bad smell coming from the wound or dressing.  Document Released: 11/24/2000 Document Revised: 05/20/2011 Document Reviewed: 07/26/2008 [facility] Patient Information 2012 [place], [place].  Soak foot in warm water and hydrogen peroxide nightly and then insert cotton in nail bed, if no resolution will need to see Podiatry

## 2011-10-13 NOTE — Assessment & Plan Note (Signed)
Levels have dropped again, will increase shots to every other week again, given refill on syringes today

## 2011-10-28 ENCOUNTER — Encounter (INDEPENDENT_AMBULATORY_CARE_PROVIDER_SITE_OTHER): Payer: Self-pay | Admitting: Surgery

## 2011-11-01 ENCOUNTER — Ambulatory Visit (INDEPENDENT_AMBULATORY_CARE_PROVIDER_SITE_OTHER): Payer: Commercial Indemnity | Admitting: Surgery

## 2011-11-01 ENCOUNTER — Encounter (INDEPENDENT_AMBULATORY_CARE_PROVIDER_SITE_OTHER): Payer: Self-pay | Admitting: Surgery

## 2011-11-01 VITALS — BP 108/67 | HR 65 | Temp 98.1°F | Ht 73.0 in | Wt 393.8 lb

## 2011-11-01 DIAGNOSIS — L723 Sebaceous cyst: Secondary | ICD-10-CM

## 2011-11-01 NOTE — Progress Notes (Signed)
Patient ID: Bobby [name], male   DOB: [birth year], 36 y.o.   MRN: [id number]  Chief Complaint  Patient presents with  . Pre-op Exam    eval neck cyst    HPI Bobby [name] is a 36 y.o. male.  This is a pleasant gentleman referred by Dr. Rogelia Rohrer for evaluation of a painful posterior neck cyst. The patient reports that he has had for several years but is now getting larger and much more painful. He has never had drainage from the area. He does not believe it has been infected in the past. He has had similar cyst removed elsewhere. He is otherwise without complaints HPI  Past Medical History  Diagnosis Date  . GERD (gastroesophageal reflux disease)   . ADD (attention deficit disorder with hyperactivity)     adult  . OSA (obstructive sleep apnea)   . Obesity     morbid  . Hyperlipidemia   . Hypertension   . Thyroid disease     Hypothyroid  . Tremor     idiopathic  . Lumbar back pain     with radiculopathy  . Hypothyroid 12/28/2010  . Fatigue 04/08/2011  . Depression with anxiety 04/12/2011  . Paresthesia Of Skin 04/12/2011  . Vitamin B 12 deficiency 04/13/2011  . History of hypotestosteronemia 04/13/2011  . Paronychia 10/13/2011  . HTN (hypertension) 07/08/2010    Qualifier: Diagnosis of  By: Abner Greenspan MD, Misty Stanley    . Anemia     History reviewed. No pertinent past surgical history.  Family History  Problem Relation Age of Onset  . Hypertension Mother   . Obesity Mother   . Cardiomyopathy Mother   . Arthritis Father   . Gout Father   . Hyperlipidemia Father   . Hypertension Father   . Cleft palate Father   . Other Father     disassociative fugue  . Obesity Father   . ADD / ADHD Daughter     ADHD  . Coronary artery disease Maternal Grandmother     s/p multiple MI's first one in late 33's  . Other Maternal Grandmother     CHF  . Cancer Maternal Grandmother     breast  . Other Maternal Grandfather     Essential tremors  . Cancer Maternal Grandfather     spinal/  smoker/brain  . Leukemia Paternal Grandmother   . Cancer Paternal Grandmother     leukemia  . Atrial fibrillation Paternal Grandfather 34  . Hypertension Paternal Grandfather   . Other Paternal Doctor, general practice  . Cancer Maternal Uncle     colon    Social History History  Substance Use Topics  . Smoking status: Never Smoker   . Smokeless tobacco: Never Used  . Alcohol Use: No     Rare - 1-2x/year    No Known Allergies  Current Outpatient Prescriptions  Medication Sig Dispense Refill  . Alcohol Swabs (ALCOHOL PREP SWABS) PADS Use as directed before injections  1 each  1  . aspirin 81 MG tablet Take 81 mg by mouth daily.        . bifidobacterium infantis (ALIGN) capsule Take 1 capsule by mouth daily.      . citalopram (CELEXA) 20 MG tablet Take 1 tablet (20 mg total) by mouth daily.  30 tablet  3  . colesevelam (WELCHOL) 625 MG tablet Take 3 tablets (1,875 mg total) by mouth 2 (two) times daily with a meal.  180 tablet  2  . cyanocobalamin (,VITAMIN  B-12,) 1000 MCG/ML injection Inject 1 mL (1,000 mcg total) into the muscle every 30 (thirty) days.  10 mL  3  . cyclobenzaprine (FLEXERIL) 10 MG tablet Take 10 mg by mouth 2 (two) times daily as needed. For pain- may cause sedation       . levothyroxine (SYNTHROID, LEVOTHROID) 25 MCG tablet Take one tablet by mouth daily  30 tablet  11  . naproxen (NAPROSYN) 375 MG tablet Take 375 mg by mouth 2 (two) times daily with meals. As needed for pain       . nebivolol (BYSTOLIC) 5 MG tablet Take 0.5 tablets (2.5 mg total) by mouth daily.  35 tablet    . Omega-3 Fatty Acids (FISH OIL) 1000 MG CAPS Take 1 capsule by mouth 2 (two) times daily.        . ranitidine (ZANTAC) 150 MG tablet TAKE ONE TABLET BY MOUTH EVERY DAY  30 tablet  6  . Syringe/Needle, Disp, (SYRINGE 3CC/22GX1-1/2") 22G X 1-1/2" 3 ML MISC Use with testosterone injection monthly  50 each  0  . Syringe/Needle, Disp, (SYRINGE 3CC/25GX1") 25G X 1" 3 ML MISC Use to  draw up B12 injection monthly  50 each  0  . testosterone cypionate (DEPOTESTOTERONE CYPIONATE) 100 MG/ML injection Inject 200 mg into the muscle every 28 (twenty-eight) days. For IM use only      . nitroGLYCERIN (NITROSTAT) 0.4 MG SL tablet Place 0.4 mg under the tongue every 5 (five) minutes as needed. As needed for chest pain, may repeat q 5 min X 3 doses, if no resolution go to ER       . testosterone cypionate (DEPO-TESTOSTERONE) 200 MG/ML injection Inject 0.5 mLs (100 mg total) into the muscle 7 days.  10 mL  0  . testosterone cypionate (DEPO-TESTOSTERONE) 200 MG/ML injection Inject 0.5 mLs (100 mg total) into the muscle every 14 (fourteen) days.  10 mL  3    Review of Systems Review of Systems  Constitutional: Negative for fever, chills and unexpected weight change.  HENT: Negative for hearing loss, congestion, sore throat, trouble swallowing and voice change.   Eyes: Negative for visual disturbance.  Respiratory: Positive for apnea. Negative for cough and wheezing.   Cardiovascular: Negative for chest pain, palpitations and leg swelling.  Gastrointestinal: Negative for nausea, vomiting, abdominal pain, diarrhea, constipation, blood in stool, abdominal distention, anal bleeding and rectal pain.  Genitourinary: Negative for hematuria and difficulty urinating.  Musculoskeletal: Negative for arthralgias.  Skin: Negative for rash and wound.  Neurological: Negative for seizures, syncope, weakness and headaches.  Hematological: Negative for adenopathy. Does not bruise/bleed easily.  Psychiatric/Behavioral: Negative for confusion.    Blood pressure 108/67, pulse 65, temperature 98.1 F (36.7 C), temperature source Temporal, height 6\' 1"  (1.854 m), weight 393 lb 12.8 oz (178.627 kg), SpO2 98.00%.  Physical Exam Physical Exam  Constitutional: He is oriented to person, place, and time. No distress.       Morbidly obese gentleman in no acute distress  HENT:  Head: Normocephalic and  atraumatic.  Right Ear: External ear normal.  Left Ear: External ear normal.  Nose: Nose normal.  Mouth/Throat: Oropharynx is clear and moist.  Eyes: Conjunctivae are normal. Pupils are equal, round, and reactive to light. Right eye exhibits no discharge. Left eye exhibits no discharge. No scleral icterus.  Neck: Normal range of motion. Neck supple. No JVD present. No tracheal deviation present. No thyromegaly present.       There is a 2 cm cyst  on the posterior neck. It is tender but there is no erythema or fluctuance.  Cardiovascular: Normal rate, regular rhythm, normal heart sounds and intact distal pulses.   Pulmonary/Chest: Effort normal and breath sounds normal. No respiratory distress. He has no wheezes.  Lymphadenopathy:    He has no cervical adenopathy.  Neurological: He is alert and oriented to person, place, and time. No cranial nerve deficit. Coordination normal.  Skin: Skin is warm and dry. No rash noted. No erythema.  Psychiatric: His behavior is normal. Judgment normal.    Data Reviewed   Assessment    2 cm posterior neck cyst    Plan    Removal in the operating room is recommended for histologic evaluation and to prevent infection. I discussed this with him in detail. I discussed the risk of surgery which includes but is not limited to bleeding, infection, recurrence, having a chronic open wound, need for further surgery, et Karie Soda. He understands and wishes to proceed. Surgery will be scheduled. Likelihood of success is good       Bobby [name] A 11/01/2011, 10:26 AM

## 2011-11-02 MED ORDER — HEPARIN SODIUM (PORCINE) 5000 UNIT/ML IJ SOLN
INTRAMUSCULAR | Status: AC
  Filled 2011-11-02: qty 1

## 2011-11-02 MED ORDER — CEFAZOLIN SODIUM-DEXTROSE 2-3 GM-% IV SOLR
INTRAVENOUS | Status: AC
  Filled 2011-11-02: qty 50

## 2011-11-03 ENCOUNTER — Telehealth (INDEPENDENT_AMBULATORY_CARE_PROVIDER_SITE_OTHER): Payer: Self-pay

## 2011-11-03 NOTE — Telephone Encounter (Signed)
I will forward to Dr Magnus Ivan

## 2011-11-03 NOTE — Telephone Encounter (Signed)
Tammy from day surgery called and said surgery will need to be cancelled and rescheduled over at [facility] or [facility] due to patient weight. I will let schedulers know so they can work on it. Patient informed.

## 2011-11-05 ENCOUNTER — Ambulatory Visit (HOSPITAL_COMMUNITY)
Admission: RE | Admit: 2011-11-05 | Discharge: 2011-11-05 | Disposition: A | Discharge status: Home / Self Care | Payer: Managed Care, Other (non HMO) | Source: Ambulatory Visit | Attending: Surgery | Admitting: Surgery

## 2011-11-05 ENCOUNTER — Encounter (HOSPITAL_COMMUNITY): Payer: Self-pay

## 2011-11-05 ENCOUNTER — Encounter (HOSPITAL_COMMUNITY): Payer: Self-pay | Admitting: Pharmacy Technician

## 2011-11-05 ENCOUNTER — Encounter (HOSPITAL_COMMUNITY)
Admission: RE | Admit: 2011-11-05 | Discharge: 2011-11-05 | Disposition: A | Discharge status: Home / Self Care | Payer: Managed Care, Other (non HMO) | Source: Ambulatory Visit | Attending: Surgery | Admitting: Surgery

## 2011-11-05 DIAGNOSIS — Z01818 Encounter for other preprocedural examination: Secondary | ICD-10-CM | POA: Insufficient documentation

## 2011-11-05 DIAGNOSIS — Z01812 Encounter for preprocedural laboratory examination: Secondary | ICD-10-CM | POA: Insufficient documentation

## 2011-11-05 DIAGNOSIS — L723 Sebaceous cyst: Secondary | ICD-10-CM | POA: Insufficient documentation

## 2011-11-05 DIAGNOSIS — Z01810 Encounter for preprocedural cardiovascular examination: Secondary | ICD-10-CM | POA: Insufficient documentation

## 2011-11-05 LAB — SURGICAL PCR SCREEN
MRSA, PCR: POSITIVE — AB
Staphylococcus aureus: POSITIVE — AB

## 2011-11-05 NOTE — Patient Instructions (Signed)
20 Bion Todorov  11/05/2011   Your procedure is scheduled on:  11-10-2011  Report to [facility] at 0630  AM.  Call this number if you have problems the morning of surgery: [phone]   Remember:bring cpap mask and leave in car              Bring driver wife allison cell cell [phone]    Do not eat food or drink liquids:After Midnight.  .  Take these medicines the morning of surgery with A SIP OF WATER: bystolic, citalopram, levothyroxine   Do not wear jewelry or make up.  Do not wear lotions, powders, or perfumes.Do not wear deodorant.    Do not bring valuables to the hospital.  Contacts, dentures or bridgework may not be worn into surgery.  Leave suitcase in the car. After surgery it may be brought to your room.  For patients admitted to the hospital, checkout time is 11:00 AM the day of discharge.     Special Instructions: CHG Shower Use Special Wash: 1/2 bottle night before surgery and 1/2 bottle morning of surgery.neck down avoid private area   Please read over the following fact sheets that you were given: MRSA Information  [facility] pre op nurse phone number [phone], call if needed

## 2011-11-05 NOTE — Pre-Procedure Instructions (Signed)
11/05/11 patient returned call and voiced understanding of use of Mupirocin ointment.

## 2011-11-05 NOTE — Pre-Procedure Instructions (Addendum)
Cbc, bmet, hepatic function panel 10-13-2011 epic lov note [facility] 07-30-2011 no current cardiologist Stress test 08-12-2011 epic

## 2011-11-09 NOTE — H&P (Signed)
Bobby Rubenstein, MD 11/01/2011 10:32 AM Signed  Patient ID: Bobby [name], male DOB: 1976/04/22, 36 y.o. MRN: [id number]  Chief Complaint   Patient presents with   .  Pre-op Exam     eval neck cyst    HPI  Bobby [name] is a 36 y.o. male. This is a pleasant gentleman referred by Dr. Rogelia Rohrer for evaluation of a painful posterior neck cyst. The patient reports that he has had for several years but is now getting larger and much more painful. He has never had drainage from the area. He does not believe it has been infected in the past. He has had similar cyst removed elsewhere. He is otherwise without complaints  HPI  Past Medical History   Diagnosis  Date   .  GERD (gastroesophageal reflux disease)    .  ADD (attention deficit disorder with hyperactivity)      adult   .  OSA (obstructive sleep apnea)    .  Obesity      morbid   .  Hyperlipidemia    .  Hypertension    .  Thyroid disease      Hypothyroid   .  Tremor      idiopathic   .  Lumbar back pain      with radiculopathy   .  Hypothyroid  12/28/2010   .  Fatigue  04/08/2011   .  Depression with anxiety  04/12/2011   .  Paresthesia Of Skin  04/12/2011   .  Vitamin B 12 deficiency  04/13/2011   .  History of hypotestosteronemia  04/13/2011   .  Paronychia  10/13/2011   .  HTN (hypertension)  07/08/2010     Qualifier: Diagnosis of By: Abner Greenspan MD, Misty Stanley   .  Anemia     History reviewed. No pertinent past surgical history.  Family History   Problem  Relation  Age of Onset   .  Hypertension  Mother    .  Obesity  Mother    .  Cardiomyopathy  Mother    .  Arthritis  Father    .  Gout  Father    .  Hyperlipidemia  Father    .  Hypertension  Father    .  Cleft palate  Father    .  Other  Father       disassociative fugue    .  Obesity  Father    .  ADD / ADHD  Daughter       ADHD    .  Coronary artery disease  Maternal Grandmother       s/p multiple MI's first one in late 59's    .  Other  Maternal Grandmother      CHF    .  Cancer  Maternal Grandmother       breast    .  Other  Maternal Grandfather       Essential tremors    .  Cancer  Maternal Grandfather       spinal/ smoker/brain    .  Leukemia  Paternal Grandmother    .  Cancer  Paternal Grandmother       leukemia    .  Atrial fibrillation  Paternal Grandfather  84   .  Hypertension  Paternal Grandfather    .  Other  Paternal Counsellor    .  Cancer  Maternal Uncle  colon    Social History  History   Substance Use Topics   .  Smoking status:  Never Smoker   .  Smokeless tobacco:  Never Used   .  Alcohol Use:  No      Rare - 1-2x/year    No Known Allergies  Current Outpatient Prescriptions   Medication  Sig  Dispense  Refill   .  Alcohol Swabs (ALCOHOL PREP SWABS) PADS  Use as directed before injections  1 each  1   .  aspirin 81 MG tablet  Take 81 mg by mouth daily.     .  bifidobacterium infantis (ALIGN) capsule  Take 1 capsule by mouth daily.     .  citalopram (CELEXA) 20 MG tablet  Take 1 tablet (20 mg total) by mouth daily.  30 tablet  3   .  colesevelam (WELCHOL) 625 MG tablet  Take 3 tablets (1,875 mg total) by mouth 2 (two) times daily with a meal.  180 tablet  2   .  cyanocobalamin (,VITAMIN B-12,) 1000 MCG/ML injection  Inject 1 mL (1,000 mcg total) into the muscle every 30 (thirty) days.  10 mL  3   .  cyclobenzaprine (FLEXERIL) 10 MG tablet  Take 10 mg by mouth 2 (two) times daily as needed. For pain- may cause sedation     .  levothyroxine (SYNTHROID, LEVOTHROID) 25 MCG tablet  Take one tablet by mouth daily  30 tablet  11   .  naproxen (NAPROSYN) 375 MG tablet  Take 375 mg by mouth 2 (two) times daily with meals. As needed for pain     .  nebivolol (BYSTOLIC) 5 MG tablet  Take 0.5 tablets (2.5 mg total) by mouth daily.  35 tablet    .  Omega-3 Fatty Acids (FISH OIL) 1000 MG CAPS  Take 1 capsule by mouth 2 (two) times daily.     .  ranitidine (ZANTAC) 150 MG tablet  TAKE ONE  TABLET BY MOUTH EVERY DAY  30 tablet  6   .  Syringe/Needle, Disp, (SYRINGE 3CC/22GX1-1/2") 22G X 1-1/2" 3 ML MISC  Use with testosterone injection monthly  50 each  0   .  Syringe/Needle, Disp, (SYRINGE 3CC/25GX1") 25G X 1" 3 ML MISC  Use to draw up B12 injection monthly  50 each  0   .  testosterone cypionate (DEPOTESTOTERONE CYPIONATE) 100 MG/ML injection  Inject 200 mg into the muscle every 28 (twenty-eight) days. For IM use only     .  nitroGLYCERIN (NITROSTAT) 0.4 MG SL tablet  Place 0.4 mg under the tongue every 5 (five) minutes as needed. As needed for chest pain, may repeat q 5 min X 3 doses, if no resolution go to ER     .  testosterone cypionate (DEPO-TESTOSTERONE) 200 MG/ML injection  Inject 0.5 mLs (100 mg total) into the muscle 7 days.  10 mL  0   .  testosterone cypionate (DEPO-TESTOSTERONE) 200 MG/ML injection  Inject 0.5 mLs (100 mg total) into the muscle every 14 (fourteen) days.  10 mL  3    Review of Systems  Review of Systems  Constitutional: Negative for fever, chills and unexpected weight change.  HENT: Negative for hearing loss, congestion, sore throat, trouble swallowing and voice change.  Eyes: Negative for visual disturbance.  Respiratory: Positive for apnea. Negative for cough and wheezing.  Cardiovascular: Negative for chest pain, palpitations and leg swelling.  Gastrointestinal: Negative for nausea, vomiting, abdominal pain, diarrhea,  constipation, blood in stool, abdominal distention, anal bleeding and rectal pain.  Genitourinary: Negative for hematuria and difficulty urinating.  Musculoskeletal: Negative for arthralgias.  Skin: Negative for rash and wound.  Neurological: Negative for seizures, syncope, weakness and headaches.  Hematological: Negative for adenopathy. Does not bruise/bleed easily.  Psychiatric/Behavioral: Negative for confusion.   Blood pressure 108/67, pulse 65, temperature 98.1 F (36.7 C), temperature source Temporal, height 6\' 1"  (1.854 m),  weight 393 lb 12.8 oz (178.627 kg), SpO2 98.00%.  Physical Exam  Physical Exam  Constitutional: He is oriented to person, place, and time. No distress.  Morbidly obese gentleman in no acute distress  HENT:  Head: Normocephalic and atraumatic.  Right Ear: External ear normal.  Left Ear: External ear normal.  Nose: Nose normal.  Mouth/Throat: Oropharynx is clear and moist.  Eyes: Conjunctivae are normal. Pupils are equal, round, and reactive to light. Right eye exhibits no discharge. Left eye exhibits no discharge. No scleral icterus.  Neck: Normal range of motion. Neck supple. No JVD present. No tracheal deviation present. No thyromegaly present.  There is a 2 cm cyst on the posterior neck. It is tender but there is no erythema or fluctuance.  Cardiovascular: Normal rate, regular rhythm, normal heart sounds and intact distal pulses.  Pulmonary/Chest: Effort normal and breath sounds normal. No respiratory distress. He has no wheezes.  Lymphadenopathy:  He has no cervical adenopathy.  Neurological: He is alert and oriented to person, place, and time. No cranial nerve deficit. Coordination normal.  Skin: Skin is warm and dry. No rash noted. No erythema.  Psychiatric: His behavior is normal. Judgment normal.   Data Reviewed  Assessment   2 cm posterior neck cyst   Plan   Removal in the operating room is recommended for histologic evaluation and to prevent infection. I discussed this with him in detail. I discussed the risk of surgery which includes but is not limited to bleeding, infection, recurrence, having a chronic open wound, need for further surgery, et Karie Soda. He understands and wishes to proceed. Surgery will be scheduled. Likelihood of success is good   Bretta Fees A

## 2011-11-10 ENCOUNTER — Ambulatory Visit (HOSPITAL_BASED_OUTPATIENT_CLINIC_OR_DEPARTMENT_OTHER): Admission: RE | Admit: 2011-11-10 | Payer: Commercial Indemnity | Source: Ambulatory Visit | Admitting: Surgery

## 2011-11-10 ENCOUNTER — Encounter (HOSPITAL_COMMUNITY): Payer: Self-pay | Admitting: Anesthesiology

## 2011-11-10 ENCOUNTER — Ambulatory Visit (HOSPITAL_COMMUNITY)
Admission: RE | Admit: 2011-11-10 | Discharge: 2011-11-10 | Disposition: A | Discharge status: Home / Self Care | Payer: Managed Care, Other (non HMO) | Source: Ambulatory Visit | Attending: Surgery | Admitting: Surgery

## 2011-11-10 ENCOUNTER — Encounter (HOSPITAL_COMMUNITY)
Admission: RE | Disposition: A | Discharge status: Home / Self Care | Payer: Self-pay | Source: Ambulatory Visit | Attending: Surgery

## 2011-11-10 ENCOUNTER — Encounter (HOSPITAL_BASED_OUTPATIENT_CLINIC_OR_DEPARTMENT_OTHER): Admission: RE | Payer: Self-pay | Source: Ambulatory Visit

## 2011-11-10 ENCOUNTER — Encounter (HOSPITAL_COMMUNITY): Payer: Self-pay | Admitting: *Deleted

## 2011-11-10 ENCOUNTER — Ambulatory Visit (HOSPITAL_COMMUNITY): Payer: Managed Care, Other (non HMO) | Admitting: Anesthesiology

## 2011-11-10 DIAGNOSIS — G4733 Obstructive sleep apnea (adult) (pediatric): Secondary | ICD-10-CM | POA: Insufficient documentation

## 2011-11-10 DIAGNOSIS — L723 Sebaceous cyst: Secondary | ICD-10-CM | POA: Insufficient documentation

## 2011-11-10 DIAGNOSIS — E039 Hypothyroidism, unspecified: Secondary | ICD-10-CM | POA: Insufficient documentation

## 2011-11-10 DIAGNOSIS — Z79899 Other long term (current) drug therapy: Secondary | ICD-10-CM | POA: Insufficient documentation

## 2011-11-10 DIAGNOSIS — K219 Gastro-esophageal reflux disease without esophagitis: Secondary | ICD-10-CM | POA: Insufficient documentation

## 2011-11-10 DIAGNOSIS — Z7982 Long term (current) use of aspirin: Secondary | ICD-10-CM | POA: Insufficient documentation

## 2011-11-10 DIAGNOSIS — E669 Obesity, unspecified: Secondary | ICD-10-CM | POA: Insufficient documentation

## 2011-11-10 DIAGNOSIS — E785 Hyperlipidemia, unspecified: Secondary | ICD-10-CM | POA: Insufficient documentation

## 2011-11-10 DIAGNOSIS — I10 Essential (primary) hypertension: Secondary | ICD-10-CM | POA: Insufficient documentation

## 2011-11-10 SURGERY — EXCISION, CYST, NECK
Anesthesia: Monitor Anesthesia Care | Site: Neck | Wound class: Clean Contaminated

## 2011-11-10 SURGERY — EXCISION, CYST, NECK
Anesthesia: Monitor Anesthesia Care

## 2011-11-10 MED ORDER — ACETAMINOPHEN 650 MG RE SUPP
650.0000 mg | RECTAL | Status: DC | PRN
  Filled 2011-11-10: qty 1

## 2011-11-10 MED ORDER — MIDAZOLAM HCL 5 MG/5ML IJ SOLN
INTRAMUSCULAR | Status: DC | PRN
  Administered 2011-11-10 (×2): 1 mg via INTRAVENOUS

## 2011-11-10 MED ORDER — CEFAZOLIN SODIUM-DEXTROSE 2-3 GM-% IV SOLR
INTRAVENOUS | Status: AC
  Filled 2011-11-10: qty 50

## 2011-11-10 MED ORDER — OXYCODONE HCL 5 MG PO TABS: 5.0000 mg | ORAL_TABLET | ORAL | Status: DC | PRN

## 2011-11-10 MED ORDER — LIDOCAINE HCL 1 % IJ SOLN
INTRAMUSCULAR | Status: AC
  Filled 2011-11-10: qty 20

## 2011-11-10 MED ORDER — BUPIVACAINE HCL (PF) 0.5 % IJ SOLN
INTRAMUSCULAR | Status: AC
  Filled 2011-11-10: qty 30

## 2011-11-10 MED ORDER — SODIUM CHLORIDE 0.9 % IV SOLN: 250.0000 mL | INTRAVENOUS | Status: DC | PRN

## 2011-11-10 MED ORDER — MORPHINE SULFATE 10 MG/ML IJ SOLN: 4.0000 mg | INTRAMUSCULAR | Status: DC | PRN

## 2011-11-10 MED ORDER — FENTANYL CITRATE 0.05 MG/ML IJ SOLN: 25.0000 ug | INTRAMUSCULAR | Status: DC | PRN

## 2011-11-10 MED ORDER — HYDROCODONE-ACETAMINOPHEN 5-325 MG PO TABS: 1.0000 | ORAL_TABLET | ORAL | Status: AC | PRN

## 2011-11-10 MED ORDER — SODIUM CHLORIDE 0.9 % IJ SOLN: 3.0000 mL | INTRAMUSCULAR | Status: DC | PRN

## 2011-11-10 MED ORDER — SODIUM CHLORIDE 0.9 % IJ SOLN: 3.0000 mL | Freq: Two times a day (BID) | INTRAMUSCULAR | Status: DC

## 2011-11-10 MED ORDER — BUPIVACAINE-EPINEPHRINE (PF) 0.5% -1:200000 IJ SOLN
INTRAMUSCULAR | Status: AC
  Filled 2011-11-10: qty 10

## 2011-11-10 MED ORDER — PROMETHAZINE HCL 25 MG/ML IJ SOLN: 6.2500 mg | INTRAMUSCULAR | Status: DC | PRN

## 2011-11-10 MED ORDER — ONDANSETRON HCL 4 MG/2ML IJ SOLN: 4.0000 mg | Freq: Four times a day (QID) | INTRAMUSCULAR | Status: DC | PRN

## 2011-11-10 MED ORDER — LIDOCAINE HCL 1 % IJ SOLN
INTRAMUSCULAR | Status: DC | PRN
  Administered 2011-11-10: 10 mL

## 2011-11-10 MED ORDER — LIDOCAINE-EPINEPHRINE 1 %-1:100000 IJ SOLN
INTRAMUSCULAR | Status: AC
  Filled 2011-11-10: qty 1

## 2011-11-10 MED ORDER — LACTATED RINGERS IV SOLN
INTRAVENOUS | Status: DC | PRN
  Administered 2011-11-10: 08:00:00 via INTRAVENOUS

## 2011-11-10 MED ORDER — ACETAMINOPHEN 325 MG PO TABS: 650.0000 mg | ORAL_TABLET | ORAL | Status: DC | PRN

## 2011-11-10 MED ORDER — PROPOFOL 10 MG/ML IV EMUL
INTRAVENOUS | Status: DC | PRN
  Administered 2011-11-10: 100 ug/kg/min via INTRAVENOUS

## 2011-11-10 MED ORDER — FENTANYL CITRATE 0.05 MG/ML IJ SOLN
INTRAMUSCULAR | Status: DC | PRN
  Administered 2011-11-10 (×2): 50 ug via INTRAVENOUS

## 2011-11-10 MED ORDER — CEFAZOLIN SODIUM-DEXTROSE 2-3 GM-% IV SOLR: 2.0000 g | INTRAVENOUS | Status: DC

## 2011-11-10 SURGICAL SUPPLY — 40 items
ADH SKN CLS APL DERMABOND .7 (GAUZE/BANDAGES/DRESSINGS)
BANDAGE ELASTIC 6 VELCRO ST LF (GAUZE/BANDAGES/DRESSINGS) ×2 IMPLANT
BLADE SURG 15 STRL LF DISP TIS (BLADE) ×3 IMPLANT
BLADE SURG 15 STRL SS (BLADE) ×6
CANISTER SUCTION 2500CC (MISCELLANEOUS) ×2 IMPLANT
CLEANER TIP ELECTROSURG 2X2 (MISCELLANEOUS) ×2 IMPLANT
CLOTH BEACON ORANGE TIMEOUT ST (SAFETY) ×2 IMPLANT
DECANTER SPIKE VIAL GLASS SM (MISCELLANEOUS) ×2 IMPLANT
DERMABOND ADVANCED (GAUZE/BANDAGES/DRESSINGS)
DERMABOND ADVANCED .7 DNX12 (GAUZE/BANDAGES/DRESSINGS) IMPLANT
DRAPE LAPAROTOMY TRNSV 102X78 (DRAPE) ×2 IMPLANT
DRSG TEGADERM 4X4.75 (GAUZE/BANDAGES/DRESSINGS) ×1 IMPLANT
ELECT COATED BLADE 2.86 ST (ELECTRODE) ×2 IMPLANT
ELECT REM PT RETURN 9FT ADLT (ELECTROSURGICAL) ×2
ELECTRODE REM PT RTRN 9FT ADLT (ELECTROSURGICAL) ×1 IMPLANT
GAUZE SPONGE 4X4 16PLY XRAY LF (GAUZE/BANDAGES/DRESSINGS) ×2 IMPLANT
GLOVE BIO SURGEON STRL SZ7 (GLOVE) ×4 IMPLANT
GLOVE BIOGEL PI IND STRL 7.0 (GLOVE) ×1 IMPLANT
GLOVE BIOGEL PI INDICATOR 7.0 (GLOVE) ×1
GLOVE SURG SIGNA 7.5 PF LTX (GLOVE) ×2 IMPLANT
GOWN STRL NON-REIN LRG LVL3 (GOWN DISPOSABLE) ×2 IMPLANT
GOWN STRL REIN XL XLG (GOWN DISPOSABLE) ×4 IMPLANT
KIT BASIN OR (CUSTOM PROCEDURE TRAY) ×2 IMPLANT
NDL HYPO 25X1 1.5 SAFETY (NEEDLE) ×1 IMPLANT
NEEDLE HYPO 22GX1.5 SAFETY (NEEDLE) ×2 IMPLANT
NEEDLE HYPO 25X1 1.5 SAFETY (NEEDLE) ×2 IMPLANT
PACK BASIC VI WITH GOWN DISP (CUSTOM PROCEDURE TRAY) ×2 IMPLANT
PEN SKIN MARKING BROAD (MISCELLANEOUS) ×2 IMPLANT
PENCIL BUTTON HOLSTER BLD 10FT (ELECTRODE) ×2 IMPLANT
SOL PREP POV-IOD 16OZ 10% (MISCELLANEOUS) ×2 IMPLANT
SPONGE GAUZE 4X4 12PLY (GAUZE/BANDAGES/DRESSINGS) ×2 IMPLANT
SPONGE LAP 4X18 X RAY DECT (DISPOSABLE) ×2 IMPLANT
STRIP CLOSURE SKIN 1/4X3 (GAUZE/BANDAGES/DRESSINGS) ×4 IMPLANT
SUT ETHILON 2 0 PS N (SUTURE) ×2 IMPLANT
SUT VIC AB 3-0 SH 27 (SUTURE)
SUT VIC AB 3-0 SH 27XBRD (SUTURE) IMPLANT
SYR BULB IRRIGATION 50ML (SYRINGE) ×2 IMPLANT
SYR CONTROL 10ML LL (SYRINGE) ×2 IMPLANT
TOWEL OR 17X26 10 PK STRL BLUE (TOWEL DISPOSABLE) ×2 IMPLANT
YANKAUER SUCT BULB TIP 10FT TU (MISCELLANEOUS) ×2 IMPLANT

## 2011-11-10 NOTE — Discharge Instructions (Signed)
May remove bandage and shower tomorrow.  Ice pack as needed

## 2011-11-10 NOTE — Anesthesia Postprocedure Evaluation (Signed)
  Anesthesia Post-op Note  Patient: Bobby [name]  Procedure(s) Performed: Procedure(s) (LRB): CYST REMOVAL NECK (N/A)  Patient Location: PACU  Anesthesia Type: MAC  Level of Consciousness: awake and alert   Airway and Oxygen Therapy: Patient Spontanous Breathing  Post-op Pain: mild  Post-op Assessment: Post-op Vital signs reviewed, Patient's Cardiovascular Status Stable, Respiratory Function Stable, Patent Airway and No signs of Nausea or vomiting  Post-op Vital Signs: stable  Complications: No apparent anesthesia complications

## 2011-11-10 NOTE — Transfer of Care (Signed)
Immediate Anesthesia Transfer of Care Note  Patient: Bobby [name]  Procedure(s) Performed: Procedure(s) (LRB): CYST REMOVAL NECK (N/A)  Patient Location: PACU  Anesthesia Type: MAC  Level of Consciousness: awake, alert  and patient cooperative  Airway & Oxygen Therapy: Patient Spontanous Breathing  Post-op Assessment: Report given to PACU RN, Post -op Vital signs reviewed and stable and Patient moving all extremities  Post vital signs: Reviewed and stable  Complications: No apparent anesthesia complications

## 2011-11-10 NOTE — Anesthesia Preprocedure Evaluation (Signed)
Anesthesia Evaluation  Patient identified by MRN, date of birth, ID band Patient awake    Reviewed: Allergy & Precautions, H&P , NPO status , Patient's Chart, lab work & pertinent test results  Airway Mallampati: II TM Distance: <3 FB Neck ROM: Full    Dental No notable dental hx.    Pulmonary sleep apnea ,  breath sounds clear to auscultation  Pulmonary exam normal       Cardiovascular hypertension, Rhythm:Regular Rate:Normal     Neuro/Psych negative neurological ROS  negative psych ROS   GI/Hepatic Neg liver ROS, GERD-  ,  Endo/Other  Hypothyroidism Morbid obesity  Renal/GU negative Renal ROS  negative genitourinary   Musculoskeletal negative musculoskeletal ROS (+)   Abdominal   Peds negative pediatric ROS (+)  Hematology negative hematology ROS (+)   Anesthesia Other Findings   Reproductive/Obstetrics negative OB ROS                           Anesthesia Physical Anesthesia Plan  ASA: III  Anesthesia Plan: MAC   Post-op Pain Management:    Induction: Intravenous  Airway Management Planned: Nasal Cannula and Simple Face Mask  Additional Equipment:   Intra-op Plan:   Post-operative Plan:   Informed Consent: I have reviewed the patients History and Physical, chart, labs and discussed the procedure including the risks, benefits and alternatives for the proposed anesthesia with the patient or authorized representative who has indicated his/her understanding and acceptance.     Plan Discussed with: CRNA  Anesthesia Plan Comments:         Anesthesia Quick Evaluation

## 2011-11-10 NOTE — Op Note (Signed)
CYST REMOVAL NECK  Procedure Note  Aeric Burnham 11/10/2011   Pre-op Diagnosis: posterior neck cyst( 2 cm)     Post-op Diagnosis: same  Procedure(s): EXCISION 2 CM POSTERIOR NECK CYST  Surgeon(s): Shelly Rubenstein, MD  Anesthesia: Monitor Anesthesia Care  Staff:  Jaynee Eagles - Circulator Beryl Meager, CST - Scrub Person Tammy Marathon, [place] - Scrub Person  Estimated Blood Loss: Minimal               Specimens: cyst to path         Procedure: The patient was brought to the operating room and identified as the correct patient. He is placed on the operating table in spine position and then moved to the left lateral decubitus position. His posterior neck was then prepped and draped in the usual sterile fashion. The skin overlying the palpable cyst was anesthetized with 1% lidocaine. I made a transverse incision in a skin fold with the scalpel. I took this down through the very thick skin with a scalpel. I then identified the large 2 cm cyst which appeared consistent with a sebaceous cyst. I excised the cyst in its entirety with a scalpel including the sebaceous debris. This was sent to pathology for evaluation. I then irrigated the wound and achieved hemostasis with cautery. I then closed the incision with 2-0 nylon sutures. Gauze and Tegaderm was applied. The patient tolerated the procedure well. All the counts were correct at the end of the procedure. The patient was then taken in a stable condition to the recovery room. Arkin Imran A   Date: 11/10/2011  Time: 8:42 AM

## 2011-11-10 NOTE — Interval H&P Note (Signed)
History and Physical Interval Note:  He has had no change in his history or exam.  11/10/2011 7:06 AM  Bobby [name]  has presented today for surgery, with the diagnosis of posterior neck cyst  The various methods of treatment have been discussed with the patient and family. After consideration of risks, benefits and other options for treatment, the patient has consented to  Procedure(s) (LRB): CYST REMOVAL[ POSTERIOR NECK (N/A) as a surgical intervention .  The patients' history has been reviewed, patient examined, no change in status, stable for surgery.  I have reviewed the patients' chart and labs.  Questions were answered to the patient's satisfaction.     Nitisha Civello A

## 2011-11-10 NOTE — Preoperative (Signed)
Beta Blockers   Reason not to administer Beta Blockers:Not Applicable 

## 2011-11-12 ENCOUNTER — Encounter (INDEPENDENT_AMBULATORY_CARE_PROVIDER_SITE_OTHER): Payer: Self-pay | Admitting: General Surgery

## 2011-11-19 ENCOUNTER — Encounter (INDEPENDENT_AMBULATORY_CARE_PROVIDER_SITE_OTHER): Payer: Self-pay | Admitting: Surgery

## 2011-11-19 ENCOUNTER — Ambulatory Visit: Payer: Commercial Indemnity | Admitting: Family Medicine

## 2011-11-19 ENCOUNTER — Ambulatory Visit (INDEPENDENT_AMBULATORY_CARE_PROVIDER_SITE_OTHER): Payer: Managed Care, Other (non HMO) | Admitting: Surgery

## 2011-11-19 VITALS — BP 120/76 | Temp 98.0°F | Resp 14 | Ht 73.0 in | Wt >= 6400 oz

## 2011-11-19 DIAGNOSIS — Z09 Encounter for follow-up examination after completed treatment for conditions other than malignant neoplasm: Secondary | ICD-10-CM

## 2011-11-19 NOTE — Progress Notes (Signed)
Subjective:     Patient ID: Bobby [name], male   DOB: April 23, 1976, 36 y.o.   MRN: [id number]  HPI He is here for his first postop visit status post excision of a large sebaceous cyst on the back of his neck.  Review of Systems     Objective:   Physical Exam The final pathology confirmed a sebaceous cyst without evidence of malignancy.   The incision looks good. I removed his sutures and placed Steri-Strips Assessment:     Patient status post excision of sebaceous cyst on the back of the neck    Plan:     I will see him back as needed

## 2011-11-26 ENCOUNTER — Ambulatory Visit: Payer: Commercial Indemnity | Admitting: Family Medicine

## 2011-11-29 ENCOUNTER — Encounter (INDEPENDENT_AMBULATORY_CARE_PROVIDER_SITE_OTHER): Payer: Commercial Indemnity | Admitting: Surgery

## 2011-12-03 ENCOUNTER — Ambulatory Visit (INDEPENDENT_AMBULATORY_CARE_PROVIDER_SITE_OTHER): Payer: Managed Care, Other (non HMO) | Admitting: Family Medicine

## 2011-12-03 ENCOUNTER — Encounter: Payer: Self-pay | Admitting: Family Medicine

## 2011-12-03 VITALS — BP 139/79 | HR 56 | Temp 97.4°F | Ht 73.0 in | Wt 394.0 lb

## 2011-12-03 DIAGNOSIS — Z22322 Carrier or suspected carrier of Methicillin resistant Staphylococcus aureus: Secondary | ICD-10-CM

## 2011-12-03 DIAGNOSIS — L723 Sebaceous cyst: Secondary | ICD-10-CM

## 2011-12-03 DIAGNOSIS — E349 Endocrine disorder, unspecified: Secondary | ICD-10-CM

## 2011-12-03 DIAGNOSIS — Z8639 Personal history of other endocrine, nutritional and metabolic disease: Secondary | ICD-10-CM

## 2011-12-03 DIAGNOSIS — E039 Hypothyroidism, unspecified: Secondary | ICD-10-CM

## 2011-12-03 DIAGNOSIS — L720 Epidermal cyst: Secondary | ICD-10-CM

## 2011-12-03 DIAGNOSIS — G4730 Sleep apnea, unspecified: Secondary | ICD-10-CM

## 2011-12-03 DIAGNOSIS — E291 Testicular hypofunction: Secondary | ICD-10-CM

## 2011-12-03 DIAGNOSIS — I10 Essential (primary) hypertension: Secondary | ICD-10-CM

## 2011-12-03 DIAGNOSIS — F32A Depression, unspecified: Secondary | ICD-10-CM

## 2011-12-03 DIAGNOSIS — Z862 Personal history of diseases of the blood and blood-forming organs and certain disorders involving the immune mechanism: Secondary | ICD-10-CM

## 2011-12-03 DIAGNOSIS — F329 Major depressive disorder, single episode, unspecified: Secondary | ICD-10-CM

## 2011-12-03 DIAGNOSIS — E538 Deficiency of other specified B group vitamins: Secondary | ICD-10-CM

## 2011-12-03 HISTORY — DX: Carrier or suspected carrier of methicillin resistant Staphylococcus aureus: Z22.322

## 2011-12-03 LAB — TESTOSTERONE: Testosterone: 165.77 ng/dL — ABNORMAL LOW (ref 300–890)

## 2011-12-03 LAB — CBC
HCT: 40.7 % (ref 39.0–52.0)
MCH: 28.6 pg (ref 26.0–34.0)
MCHC: 34.4 g/dL (ref 30.0–36.0)
RDW: 14.4 % (ref 11.5–15.5)

## 2011-12-03 LAB — VITAMIN B12: Vitamin B-12: 500 pg/mL (ref 211–911)

## 2011-12-03 LAB — RENAL FUNCTION PANEL
Albumin: 4.6 g/dL (ref 3.5–5.2)
Calcium: 9.7 mg/dL (ref 8.4–10.5)
Creat: 1.03 mg/dL (ref 0.50–1.35)
Glucose, Bld: 89 mg/dL (ref 70–99)
Sodium: 137 mEq/L (ref 135–145)

## 2011-12-03 MED ORDER — CITALOPRAM HYDROBROMIDE 20 MG PO TABS: 20.0000 mg | ORAL_TABLET | Freq: Every day | ORAL | Status: DC

## 2011-12-03 MED ORDER — TESTOSTERONE CYPIONATE 200 MG/ML IM SOLN
200.0000 mg | INTRAMUSCULAR | Status: DC
Start: 2011-12-03 — End: 2012-12-11

## 2011-12-03 NOTE — Assessment & Plan Note (Signed)
Tested prior to recent outpatient surgery.  He was dosed with Mupirocin to nares bid for 5 days.

## 2011-12-03 NOTE — Assessment & Plan Note (Signed)
Well treated will continue current dosing and recheck with next visit

## 2011-12-03 NOTE — Assessment & Plan Note (Signed)
Using CPAP machine routinely 

## 2011-12-03 NOTE — Patient Instructions (Addendum)

## 2011-12-03 NOTE — Assessment & Plan Note (Signed)
Adequately controlled on Bystolic given samples

## 2011-12-03 NOTE — Progress Notes (Signed)
Patient ID: Bobby [name], male   DOB: [birth year], 36 y.o.   MRN: [id number] Efrain Clauson [id number] 08/17/1975 12/03/2011      Progress Note-Follow Up  Subjective  Chief Complaint  Chief Complaint  Patient presents with  . Follow-up    1 month    HPI  Patient is a 36 year old Caucasian male who is in today for followup. Overall is doing well. He believes his energy level is improving and his tremors are much better with the addition of the systolic 2.5 mg daily. He's not had any recent illness. He denies any fevers chills, chest pain, palpitations, shortness or breath, GI or GU concerns. He was having some trouble with some itching and irritation in his right year but that has resolved. He's recently had an inclusion cyst excised from his neck and has tolerated that well. He continues to get himself testosterone and vitamin B12 shots without difficulty. He does note that his pharmacy is often out of his vitamin B12 but otherwise he is doing well in this regard.  Past Medical History  Diagnosis Date  . GERD (gastroesophageal reflux disease)   . Obesity     morbid  . Hyperlipidemia   . Hypertension   . Thyroid disease     Hypothyroid  . Tremor     idiopathic  . Lumbar back pain     with radiculopathy  . Hypothyroid 12/28/2010  . Fatigue 04/08/2011  . Paresthesia Of Skin 04/12/2011  . Vitamin B 12 deficiency 04/13/2011  . History of hypotestosteronemia 04/13/2011  . Paronychia 10/13/2011  . HTN (hypertension) 07/08/2010    Qualifier: Diagnosis of  By: Abner Greenspan MD, Misty Stanley    . Anemia   . OSA (obstructive sleep apnea)     cpap setting of 14  . ADD (attention deficit disorder with hyperactivity)     adult  . Depression with anxiety 04/12/2011  . 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 Inclusion cyst 07/08/2010    Qualifier: Diagnosis of  By: Abner Greenspan MD, Misty Stanley      Past Surgical History  Procedure Date  . Wisdom teeth exactraction age 89  . Meatal surgery to urinary opening age 59  .  Cystectomy 10/2011    neck    Family History  Problem Relation Age of Onset  . Hypertension Mother   . Obesity Mother   . Cardiomyopathy Mother   . Arthritis Father   . Gout Father   . Hyperlipidemia Father   . Hypertension Father   . Cleft palate Father   . Other Father     disassociative fugue  . Obesity Father   . ADD / ADHD Daughter     ADHD  . Coronary artery disease Maternal Grandmother     s/p multiple MI's first one in late 36's  . Other Maternal Grandmother     CHF  . Cancer Maternal Grandmother     breast  . Other Maternal Grandfather     Essential tremors  . Cancer Maternal Grandfather     spinal/ smoker/brain  . Leukemia Paternal Grandmother   . Cancer Paternal Grandmother     leukemia  . Atrial fibrillation Paternal Grandfather 82  . Hypertension Paternal Grandfather   . Other Paternal Doctor, general practice  . Cancer Maternal Uncle     colon    History   Social History  . Marital Status: Married    Spouse Name: N/A    Number of Children: N/A  . Years of Education: N/A  Occupational History  . Not on file.   Social History Main Topics  . Smoking status: Never Smoker   . Smokeless tobacco: Never Used  . Alcohol Use: No     Rare - 1-2x/year  . Drug Use: No  . Sexually Active: Not on file   Other Topics Concern  . Not on file   Social History Narrative  . No narrative on file    Current Outpatient Prescriptions on File Prior to Visit  Medication Sig Dispense Refill  . aspirin 81 MG tablet Take 81 mg by mouth daily with breakfast.       . bifidobacterium infantis (ALIGN) capsule Take 1 capsule by mouth daily with breakfast.      . cyanocobalamin (,VITAMIN B-12,) 1000 MCG/ML injection Inject 1 mL (1,000 mcg total) into the muscle every 30 (thirty) days.  10 mL  3  . levothyroxine (SYNTHROID, LEVOTHROID) 25 MCG tablet Take 25 mcg by mouth daily with breakfast. Take one tablet by mouth daily      . naproxen sodium (ANAPROX)  220 MG tablet Take 220 mg by mouth 2 (two) times daily as needed.      . nebivolol (BYSTOLIC) 5 MG tablet Take 2.5 mg by mouth every morning.      . Omega-3 Fatty Acids (FISH OIL) 1000 MG CAPS Take 1 capsule by mouth 2 (two) times daily.       . ranitidine (ZANTAC) 150 MG tablet Take 150 mg by mouth at bedtime.      Marland Kitchen DISCONTD: testosterone cypionate (DEPOTESTOTERONE CYPIONATE) 100 MG/ML injection Inject 200 mg into the muscle every 14 (fourteen) days. For IM use only      . Alcohol Swabs (ALCOHOL PREP SWABS) PADS Use as directed before injections  1 each  1  . nitroGLYCERIN (NITROSTAT) 0.4 MG SL tablet Place 0.4 mg under the tongue every 5 (five) minutes as needed. As needed for chest pain, may repeat q 5 min X 3 doses, if no resolution go to ER       . Syringe/Needle, Disp, (SYRINGE 3CC/22GX1-1/2") 22G X 1-1/2" 3 ML MISC by Does not apply route. Use with testosterone injection      . Syringe/Needle, Disp, (SYRINGE 3CC/25GX1") 25G X 1" 3 ML MISC Use to draw up B12 injection monthly  50 each  0  . testosterone cypionate (DEPO-TESTOSTERONE) 200 MG/ML injection Inject 0.5 mLs (100 mg total) into the muscle 7 days.  10 mL  0  . testosterone cypionate (DEPO-TESTOSTERONE) 200 MG/ML injection Inject 0.5 mLs (100 mg total) into the muscle every 14 (fourteen) days.  10 mL  3    Allergies  Allergen Reactions  . Adhesive (Tape) Itching and Rash    Review of Systems  Review of Systems  Constitutional: Negative for fever and malaise/fatigue.  HENT: Negative for congestion.   Eyes: Negative for discharge.  Respiratory: Negative for shortness of breath.   Cardiovascular: Negative for chest pain, palpitations and leg swelling.  Gastrointestinal: Negative for nausea, abdominal pain and diarrhea.  Genitourinary: Negative for dysuria.  Musculoskeletal: Negative for falls.  Skin: Negative for rash.  Neurological: Negative for loss of consciousness and headaches.  Endo/Heme/Allergies: Negative for  polydipsia.  Psychiatric/Behavioral: Negative for depression and suicidal ideas. The patient is not nervous/anxious and does not have insomnia.     Objective  BP 139/79  Pulse 56  Temp 97.4 F (36.3 C) (Temporal)  Ht 6\' 1"  (1.854 m)  Wt 394 lb (178.717 kg)  BMI 51.98 kg/m2  SpO2 98%  Physical Exam  Physical Exam  Constitutional: He is oriented to person, place, and time and well-developed, well-nourished, and in no distress. No distress.  HENT:  Head: Normocephalic and atraumatic.  Eyes: Conjunctivae are normal.  Neck: Neck supple. No thyromegaly present.  Cardiovascular: Normal rate, regular rhythm and normal heart sounds.   No murmur heard. Pulmonary/Chest: Effort normal and breath sounds normal. No respiratory distress.  Abdominal: He exhibits no distension and no mass. There is no tenderness.  Musculoskeletal: He exhibits no edema.  Neurological: He is alert and oriented to person, place, and time.  Skin: Skin is warm and dry.       Incision on occiput healing well, no fluctuance or erythema  Psychiatric: Memory, affect and judgment normal.    Lab Results  Component Value Date   TSH 2.40 10/13/2011   Lab Results  Component Value Date   WBC 7.6 10/13/2011   HGB 13.1 10/13/2011   HCT 39.9 10/13/2011   MCV 85.4 10/13/2011   PLT 277.0 10/13/2011   Lab Results  Component Value Date   CREATININE 1.0 10/13/2011   BUN 18 10/13/2011   NA 140 10/13/2011   K 4.5 10/13/2011   CL 106 10/13/2011   CO2 26 10/13/2011   Lab Results  Component Value Date   ALT 21 10/13/2011   AST 18 10/13/2011   ALKPHOS 57 10/13/2011   BILITOT 0.8 10/13/2011   Lab Results  Component Value Date   CHOL 219* 10/13/2011   Lab Results  Component Value Date   HDL 35.30* 10/13/2011   Lab Results  Component Value Date   LDLCALC 119* 04/08/2011   Lab Results  Component Value Date   TRIG 192.0* 10/13/2011   Lab Results  Component Value Date   CHOLHDL 6 10/13/2011     Assessment & Plan  MRSA colonization Tested  prior to recent outpatient surgery.  He was dosed with Mupirocin to nares bid for 5 days.  HTN (hypertension) Adequately controlled on Bystolic given samples   History of hypotestosteronemia Feels better on 200 mg every 2 weeks. Continue same and check levels today. Given a new prescription for the Testosterone at 200mg /ml today  Vitamin B 12 deficiency Is using shots monthly but his pharmacy is often out of it. He is encouraged to call around to different pharmacies and to try the SL vitamin B12 when he is out. Check levels today  Hypothyroid Well treated will continue current dosing and recheck with next visit  Inclusion cyst Recently excised by Dr Magnus Ivan, patient tolerated procedure well and is healing well  SLEEP APNEA Using CPAP machine routinely

## 2011-12-03 NOTE — Assessment & Plan Note (Signed)
Is using shots monthly but his pharmacy is often out of it. He is encouraged to call around to different pharmacies and to try the SL vitamin B12 when he is out. Check levels today

## 2011-12-03 NOTE — Assessment & Plan Note (Signed)
Recently excised by Dr Magnus Ivan, patient tolerated procedure well and is healing well

## 2011-12-03 NOTE — Assessment & Plan Note (Addendum)
Feels better on 200 mg every 2 weeks. Continue same and check levels today. Given a new prescription for the Testosterone at 200mg /ml today

## 2012-01-13 ENCOUNTER — Other Ambulatory Visit: Payer: Self-pay | Admitting: Family Medicine

## 2012-02-08 ENCOUNTER — Other Ambulatory Visit: Payer: Self-pay | Admitting: Family Medicine

## 2012-02-25 ENCOUNTER — Other Ambulatory Visit: Payer: Managed Care, Other (non HMO)

## 2012-03-03 ENCOUNTER — Ambulatory Visit: Payer: Managed Care, Other (non HMO) | Admitting: Family Medicine

## 2012-03-31 ENCOUNTER — Other Ambulatory Visit: Payer: Managed Care, Other (non HMO)

## 2012-04-07 ENCOUNTER — Ambulatory Visit: Payer: Managed Care, Other (non HMO) | Admitting: Family Medicine

## 2012-05-23 ENCOUNTER — Other Ambulatory Visit: Payer: Self-pay | Admitting: Family Medicine

## 2012-06-14 ENCOUNTER — Other Ambulatory Visit: Payer: Self-pay | Admitting: Family Medicine

## 2012-07-29 ENCOUNTER — Other Ambulatory Visit: Payer: Self-pay

## 2012-08-15 ENCOUNTER — Other Ambulatory Visit: Payer: Self-pay | Admitting: Family Medicine

## 2012-09-13 ENCOUNTER — Other Ambulatory Visit: Payer: Self-pay | Admitting: Family Medicine

## 2012-11-20 ENCOUNTER — Telehealth: Payer: Self-pay | Admitting: Family Medicine

## 2012-11-20 MED ORDER — RANITIDINE HCL 150 MG PO TABS: 150.0000 mg | ORAL_TABLET | Freq: Every day | ORAL | Status: DC

## 2012-11-20 MED ORDER — CITALOPRAM HYDROBROMIDE 20 MG PO TABS: 20.0000 mg | ORAL_TABLET | Freq: Every day | ORAL | Status: DC

## 2012-11-20 NOTE — Telephone Encounter (Signed)
Refill- citalopram 20mg  tab. Take one tablet by mouth once daily. Qty 30 last fill 5.5.14  Refill- ranitidine 150mg  tab. Take one tablet by mouth once daily. Qty 30 last fill 5.5.14

## 2012-11-20 NOTE — Telephone Encounter (Signed)
Left a detailed message stating pt has been seen since 12-02-12 and this is the last refill we can provide until pt is seen.

## 2012-12-11 ENCOUNTER — Ambulatory Visit (INDEPENDENT_AMBULATORY_CARE_PROVIDER_SITE_OTHER): Payer: Self-pay | Admitting: Family Medicine

## 2012-12-11 ENCOUNTER — Encounter: Payer: Self-pay | Admitting: Family Medicine

## 2012-12-11 VITALS — BP 118/84 | HR 72 | Temp 98.3°F | Ht 73.0 in | Wt 398.1 lb

## 2012-12-11 DIAGNOSIS — E039 Hypothyroidism, unspecified: Secondary | ICD-10-CM

## 2012-12-11 DIAGNOSIS — Z8639 Personal history of other endocrine, nutritional and metabolic disease: Secondary | ICD-10-CM

## 2012-12-11 DIAGNOSIS — K219 Gastro-esophageal reflux disease without esophagitis: Secondary | ICD-10-CM

## 2012-12-11 DIAGNOSIS — E538 Deficiency of other specified B group vitamins: Secondary | ICD-10-CM

## 2012-12-11 DIAGNOSIS — F418 Other specified anxiety disorders: Secondary | ICD-10-CM

## 2012-12-11 DIAGNOSIS — E291 Testicular hypofunction: Secondary | ICD-10-CM

## 2012-12-11 DIAGNOSIS — F341 Dysthymic disorder: Secondary | ICD-10-CM

## 2012-12-11 DIAGNOSIS — D510 Vitamin B12 deficiency anemia due to intrinsic factor deficiency: Secondary | ICD-10-CM

## 2012-12-11 DIAGNOSIS — I10 Essential (primary) hypertension: Secondary | ICD-10-CM

## 2012-12-11 DIAGNOSIS — R259 Unspecified abnormal involuntary movements: Secondary | ICD-10-CM

## 2012-12-11 DIAGNOSIS — Z862 Personal history of diseases of the blood and blood-forming organs and certain disorders involving the immune mechanism: Secondary | ICD-10-CM

## 2012-12-11 DIAGNOSIS — R7989 Other specified abnormal findings of blood chemistry: Secondary | ICD-10-CM

## 2012-12-11 DIAGNOSIS — E349 Endocrine disorder, unspecified: Secondary | ICD-10-CM

## 2012-12-11 LAB — TSH: TSH: 2.454 u[IU]/mL (ref 0.350–4.500)

## 2012-12-11 MED ORDER — NEBIVOLOL HCL 5 MG PO TABS: 2.5000 mg | ORAL_TABLET | Freq: Every morning | ORAL | Status: DC

## 2012-12-11 MED ORDER — TESTOSTERONE CYPIONATE 200 MG/ML IM SOLN: 200.0000 mg | INTRAMUSCULAR | Status: DC

## 2012-12-11 MED ORDER — RANITIDINE HCL 150 MG PO TABS: 150.0000 mg | ORAL_TABLET | Freq: Every day | ORAL | Status: DC

## 2012-12-11 MED ORDER — CITALOPRAM HYDROBROMIDE 20 MG PO TABS: 40.0000 mg | ORAL_TABLET | Freq: Every day | ORAL | Status: DC

## 2012-12-11 NOTE — Assessment & Plan Note (Signed)
Doing well off of Bystolic 2.5 mg daily but Bystolic helped his tremors so we will restart Bystolic

## 2012-12-11 NOTE — Progress Notes (Signed)
Patient ID: Bobby [name], male   DOB: [birth year], 37 y.o.   MRN: [id number] Bobby [name] [id number] 1975-07-30 12/11/2012      Progress Note-Follow Up  Subjective  Chief Complaint  Chief Complaint  Patient presents with  . Follow-up    med refill    HPI Patient is in today to reestablish care. Has been taking his vitamin B12 shots but not his testosterone for a modular reasons. Lost his insurance last year. He is struggling with fatigue and weakness since stopping medication. Also stopped the Synthroid. Is dealing with a great deal of stress at work and home and believes he needs citalopram increased. No acute complaints. No recent illness, chest pain or palpitations, shortness of breath, GI or GU concerns are noted today the  Past Medical History  Diagnosis Date  . GERD (gastroesophageal reflux disease)   . Obesity     morbid  . Hyperlipidemia   . Hypertension   . Thyroid disease     Hypothyroid  . Tremor     idiopathic  . Lumbar back pain     with radiculopathy  . Hypothyroid 12/28/2010  . Fatigue 04/08/2011  . Paresthesia of skin 04/12/2011  . Vitamin B 12 deficiency 04/13/2011  . History of hypotestosteronemia 04/13/2011  . Paronychia 10/13/2011  . HTN (hypertension) 07/08/2010    Qualifier: Diagnosis of  By: Abner Greenspan MD, Misty Stanley    . Anemia   . OSA (obstructive sleep apnea)     cpap setting of 14  . ADD (attention deficit disorder with hyperactivity)     adult  . Depression with anxiety 04/12/2011  . 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 Inclusion cyst 07/08/2010    Qualifier: Diagnosis of  By: Abner Greenspan MD, Misty Stanley      Past Surgical History  Procedure Laterality Date  . Wisdom teeth exactraction  age 89  . Meatal surgery to urinary opening  age 58  . Cystectomy  10/2011    neck    Family History  Problem Relation Age of Onset  . Hypertension Mother   . Obesity Mother   . Cardiomyopathy Mother   . Arthritis Father   . Gout Father   . Hyperlipidemia Father   .  Hypertension Father   . Cleft palate Father   . Other Father     disassociative fugue  . Obesity Father   . ADD / ADHD Daughter     ADHD  . Coronary artery disease Maternal Grandmother     s/p multiple MI's first one in late 6's  . Other Maternal Grandmother     CHF  . Cancer Maternal Grandmother     breast  . Other Maternal Grandfather     Essential tremors  . Cancer Maternal Grandfather     spinal/ smoker/brain  . Leukemia Paternal Grandmother   . Cancer Paternal Grandmother     leukemia  . Atrial fibrillation Paternal Grandfather 54  . Hypertension Paternal Grandfather   . Other Paternal Doctor, general practice  . Cancer Maternal Uncle     colon    History   Social History  . Marital Status: Married    Spouse Name: N/A    Number of Children: N/A  . Years of Education: N/A   Occupational History  . Not on file.   Social History Main Topics  . Smoking status: Never Smoker   . Smokeless tobacco: Never Used  . Alcohol Use: No     Comment: Rare - 1-2x/year  .  Drug Use: No  . Sexually Active: Not on file   Other Topics Concern  . Not on file   Social History Narrative  . No narrative on file    Current Outpatient Prescriptions on File Prior to Visit  Medication Sig Dispense Refill  . aspirin 81 MG tablet Take 81 mg by mouth daily with breakfast.       . citalopram (CELEXA) 20 MG tablet Take 1 tablet (20 mg total) by mouth daily.  30 tablet  0  . cyanocobalamin (,VITAMIN B-12,) 1000 MCG/ML injection INJECT 1 ML (1000 MCG TOTAL) INTO THE MUSCLE EVERY 30 DAYS  10 mL  2  . naproxen sodium (ANAPROX) 220 MG tablet Take 220 mg by mouth 2 (two) times daily as needed.      . nitroGLYCERIN (NITROSTAT) 0.4 MG SL tablet Place 0.4 mg under the tongue every 5 (five) minutes as needed. As needed for chest pain, may repeat q 5 min X 3 doses, if no resolution go to ER       . ranitidine (ZANTAC) 150 MG tablet Take 1 tablet (150 mg total) by mouth at bedtime.  30  tablet  0  . Syringe/Needle, Disp, (SYRINGE 3CC/22GX1-1/2") 22G X 1-1/2" 3 ML MISC by Does not apply route. Use with testosterone injection      . testosterone cypionate (DEPO-TESTOSTERONE) 200 MG/ML injection Inject 0.5 mLs (100 mg total) into the muscle 7 days.  10 mL  0   No current facility-administered medications on file prior to visit.    Allergies  Allergen Reactions  . Adhesive (Tape) Itching and Rash    Review of Systems  Review of Systems  Constitutional: Positive for malaise/fatigue. Negative for fever.  HENT: Negative for congestion.   Eyes: Negative for pain and discharge.  Respiratory: Negative for shortness of breath.   Cardiovascular: Negative for chest pain, palpitations and leg swelling.  Gastrointestinal: Negative for nausea, abdominal pain and diarrhea.  Genitourinary: Negative for dysuria.  Musculoskeletal: Negative for falls.  Skin: Negative for rash.  Neurological: Negative for loss of consciousness and headaches.  Endo/Heme/Allergies: Negative for polydipsia.  Psychiatric/Behavioral: Negative for depression and suicidal ideas. The patient is not nervous/anxious and does not have insomnia.     Objective  BP 118/84  Pulse 72  Temp(Src) 98.3 F (36.8 C) (Oral)  Ht 6\' 1"  (1.854 m)  Wt 398 lb 1.3 oz (180.568 kg)  BMI 52.53 kg/m2  SpO2 98%  Physical Exam  Physical Exam  Constitutional: He is oriented to person, place, and time and well-developed, well-nourished, and in no distress. No distress.  HENT:  Head: Normocephalic and atraumatic.  Eyes: Conjunctivae are normal.  Neck: Neck supple. No thyromegaly present.  Cardiovascular: Normal rate, regular rhythm and normal heart sounds.  Exam reveals no gallop.   No murmur heard. Pulmonary/Chest: Effort normal and breath sounds normal. No respiratory distress.  Abdominal: He exhibits no distension and no mass. There is no tenderness.  Musculoskeletal: He exhibits no edema.  Neurological: He is alert  and oriented to person, place, and time.  Skin: Skin is warm.  Psychiatric: Memory, affect and judgment normal.    Lab Results  Component Value Date   TSH 2.40 10/13/2011   Lab Results  Component Value Date   WBC 8.9 12/03/2011   HGB 14.0 12/03/2011   HCT 40.7 12/03/2011   MCV 83.1 12/03/2011   PLT 387 12/03/2011   Lab Results  Component Value Date   CREATININE 1.03 12/03/2011  BUN 16 12/03/2011   NA 137 12/03/2011   K 4.3 12/03/2011   CL 102 12/03/2011   CO2 26 12/03/2011   Lab Results  Component Value Date   ALT 21 10/13/2011   AST 18 10/13/2011   ALKPHOS 57 10/13/2011   BILITOT 0.8 10/13/2011   Lab Results  Component Value Date   CHOL 219* 10/13/2011   Lab Results  Component Value Date   HDL 35.30* 10/13/2011   Lab Results  Component Value Date   LDLCALC 119* 04/08/2011   Lab Results  Component Value Date   TRIG 192.0* 10/13/2011   Lab Results  Component Value Date   CHOLHDL 6 10/13/2011     Assessment & Plan  HTN (hypertension) Doing well off of Bystolic 2.5 mg daily but Bystolic helped his tremors so we will restart Bystolic   Vitamin B 12 deficiency Has continued vitamin B12 shots monthly and we will check levels. Will continue  Hypothyroid Check TSH today  IDIOPATHIC TREMOR Good response to Bystolic restarted today.  History of hypotestosteronemia Recheck testosterone levels and restart treatments  Depression with anxiety Struggling with stress at work and home increase Citalopram to 40 mg daily

## 2012-12-11 NOTE — Assessment & Plan Note (Signed)
Struggling with stress at work and home increase Citalopram to 40 mg daily

## 2012-12-11 NOTE — Assessment & Plan Note (Signed)
Recheck testosterone levels and restart treatments

## 2012-12-11 NOTE — Assessment & Plan Note (Signed)
Has continued vitamin B12 shots monthly and we will check levels. Will continue

## 2012-12-11 NOTE — Patient Instructions (Addendum)

## 2012-12-11 NOTE — Assessment & Plan Note (Signed)
Good response to Bystolic restarted today.

## 2012-12-11 NOTE — Assessment & Plan Note (Signed)
Check TSH today

## 2013-02-19 ENCOUNTER — Ambulatory Visit: Payer: Self-pay | Admitting: Family Medicine

## 2013-02-19 DIAGNOSIS — Z0289 Encounter for other administrative examinations: Secondary | ICD-10-CM

## 2013-03-15 ENCOUNTER — Encounter: Payer: Self-pay | Admitting: Physician Assistant

## 2013-04-19 ENCOUNTER — Other Ambulatory Visit: Payer: Self-pay

## 2013-08-03 ENCOUNTER — Other Ambulatory Visit: Payer: Self-pay | Admitting: Family Medicine

## 2013-08-03 NOTE — Telephone Encounter (Signed)
[facility] Last filled - 06.30.14, #60x3 Last AEX - 06.30.14 Next AEX - 10-weeks; No Show on 09.08.14 appt, no further appointments scheduled Please Advise/SLS

## 2013-08-07 ENCOUNTER — Telehealth: Payer: Self-pay

## 2013-08-07 ENCOUNTER — Other Ambulatory Visit: Payer: Self-pay | Admitting: Family Medicine

## 2013-08-07 NOTE — Telephone Encounter (Signed)
Pt left a message stating he would like a call back about his RX that was denied?

## 2013-08-07 NOTE — Telephone Encounter (Signed)
RX already refilled

## 2013-08-07 NOTE — Telephone Encounter (Signed)
Patient informed and he will call back to schedule an appt

## 2013-10-05 ENCOUNTER — Other Ambulatory Visit: Payer: Self-pay | Admitting: Family Medicine

## 2013-10-05 NOTE — Telephone Encounter (Signed)
Call Documentation    Regis BillSharon L Yan Okray, CMA at 08/03/2013 4:55 PM    Status: Signed        [facility]  Last filled - 06.30.14, #60x3  Last AEX - 06.30.14  Next AEX - 10-weeks; No Show on 09.08.14 appt, no further appointments scheduled  Please Advise/SLS        Routing History    Priority Sent On From To Message Type    08/03/2013 5:00 PM Bradd CanaryStacey A Blyth, MD Regis BillSharon L Karthik Whittinghill, CMA     08/03/2013 4:58 PM Regis BillSharon L Lyan Holck, CMA Bradd CanaryStacey A Blyth, MD     08/03/2013 1:33 PM Surescripts Out Interface P STACEY BLYTH RX REFILL       Created by    [employer] on 08/03/2013 01:33 PM   Refused     Disp Refills Start End   citalopram (CELEXA) 20 MG tablet [Pharmacy Med Name: CITALOPRAM 20MG  TAB] 60 tablet 0 08/03/2013    Sig: TAKE TWO TABLETS BY MOUTH ONCE DAILY   Class: Normal   DAW: No   Reason for Refusal: Patient should contact provider first   Refused By: Bradd CanaryStacey A Blyth, MD   Patient was given #60x0 on 24.24.15 upon the contingency of scheduling follow-up OV; has not done, no future appointment scheduled/SLS

## 2013-10-08 ENCOUNTER — Other Ambulatory Visit: Payer: Self-pay

## 2013-10-08 MED ORDER — CITALOPRAM HYDROBROMIDE 20 MG PO TABS: 40.0000 mg | ORAL_TABLET | Freq: Every day | ORAL | Status: DC

## 2013-10-08 NOTE — Telephone Encounter (Signed)
Pt called asking that 1 more month of celexa be filled. Pt states he will call and schedule an appt for when his insurance starts again.   1 month sent

## 2013-12-07 ENCOUNTER — Telehealth: Payer: Self-pay | Admitting: Family Medicine

## 2013-12-07 MED ORDER — CITALOPRAM HYDROBROMIDE 20 MG PO TABS
40.0000 mg | ORAL_TABLET | Freq: Every day | ORAL | Status: DC
Start: 2013-12-07 — End: 2014-02-07

## 2013-12-07 NOTE — Telephone Encounter (Signed)
We had to reschedule patient appointment. Patient would like to know if Dr. Abner GreenspanBlyth would refill citalopram to last him until 01/04/14 appointment?

## 2013-12-10 ENCOUNTER — Other Ambulatory Visit: Payer: Self-pay | Admitting: Family Medicine

## 2013-12-11 ENCOUNTER — Ambulatory Visit: Payer: Self-pay | Admitting: Family Medicine

## 2014-01-04 ENCOUNTER — Ambulatory Visit: Payer: Self-pay | Admitting: Family Medicine

## 2014-01-08 ENCOUNTER — Other Ambulatory Visit: Payer: Self-pay | Admitting: Family Medicine

## 2014-01-08 NOTE — Telephone Encounter (Signed)
Please advise if you would like to continue seeing pt.  Last OV 12-11-12  No showed next appt and cancelled on 12-11-13 and 01-04-14.  Pt was advised that he would need appt for additional refills.  I did send 15 tabs of this medication

## 2014-01-08 NOTE — Telephone Encounter (Signed)
Please advise pt he needs an appt. md will give him one more chance

## 2014-01-08 NOTE — Telephone Encounter (Signed)
He can have another chance

## 2014-01-29 ENCOUNTER — Other Ambulatory Visit: Payer: Self-pay | Admitting: Family Medicine

## 2014-01-29 NOTE — Telephone Encounter (Signed)
Left detailed message informing patient of this. °

## 2014-02-04 ENCOUNTER — Other Ambulatory Visit: Payer: Self-pay | Admitting: Family Medicine

## 2014-02-04 NOTE — Telephone Encounter (Signed)
Rx sent to pharmacy. Please remind pt to keep schedule appt in order to get more refills. LDM

## 2014-02-05 NOTE — Telephone Encounter (Signed)
Appointment has already been scheduled for 02/07/14

## 2014-02-07 ENCOUNTER — Ambulatory Visit (INDEPENDENT_AMBULATORY_CARE_PROVIDER_SITE_OTHER): Payer: Self-pay | Admitting: Family Medicine

## 2014-02-07 ENCOUNTER — Encounter: Payer: Self-pay | Admitting: Family Medicine

## 2014-02-07 VITALS — BP 110/80 | HR 63 | Temp 98.3°F | Ht 73.0 in | Wt >= 6400 oz

## 2014-02-07 DIAGNOSIS — I10 Essential (primary) hypertension: Secondary | ICD-10-CM

## 2014-02-07 DIAGNOSIS — E291 Testicular hypofunction: Secondary | ICD-10-CM

## 2014-02-07 DIAGNOSIS — D510 Vitamin B12 deficiency anemia due to intrinsic factor deficiency: Secondary | ICD-10-CM

## 2014-02-07 DIAGNOSIS — E349 Endocrine disorder, unspecified: Secondary | ICD-10-CM

## 2014-02-07 DIAGNOSIS — F341 Dysthymic disorder: Secondary | ICD-10-CM

## 2014-02-07 DIAGNOSIS — R259 Unspecified abnormal involuntary movements: Secondary | ICD-10-CM

## 2014-02-07 DIAGNOSIS — E538 Deficiency of other specified B group vitamins: Secondary | ICD-10-CM

## 2014-02-07 DIAGNOSIS — F418 Other specified anxiety disorders: Secondary | ICD-10-CM

## 2014-02-07 DIAGNOSIS — K219 Gastro-esophageal reflux disease without esophagitis: Secondary | ICD-10-CM

## 2014-02-07 DIAGNOSIS — Z23 Encounter for immunization: Secondary | ICD-10-CM

## 2014-02-07 DIAGNOSIS — Z8639 Personal history of other endocrine, nutritional and metabolic disease: Secondary | ICD-10-CM

## 2014-02-07 DIAGNOSIS — Z862 Personal history of diseases of the blood and blood-forming organs and certain disorders involving the immune mechanism: Secondary | ICD-10-CM

## 2014-02-07 DIAGNOSIS — R251 Tremor, unspecified: Secondary | ICD-10-CM

## 2014-02-07 MED ORDER — NEBIVOLOL HCL 5 MG PO TABS: 2.5000 mg | ORAL_TABLET | Freq: Every day | ORAL | Status: DC | PRN

## 2014-02-07 MED ORDER — TESTOSTERONE CYPIONATE 200 MG/ML IM SOLN: 200.0000 mg | INTRAMUSCULAR | Status: DC

## 2014-02-07 MED ORDER — RANITIDINE HCL 150 MG PO TABS: 150.0000 mg | ORAL_TABLET | Freq: Every day | ORAL | Status: DC

## 2014-02-07 MED ORDER — CITALOPRAM HYDROBROMIDE 20 MG PO TABS: 20.0000 mg | ORAL_TABLET | Freq: Every day | ORAL | Status: DC

## 2014-02-07 MED ORDER — CYANOCOBALAMIN 1000 MCG/ML IJ SOLN: 1000.0000 ug | INTRAMUSCULAR | Status: DC

## 2014-02-07 NOTE — Assessment & Plan Note (Signed)
Given refill on Vitamin B 12 today

## 2014-02-07 NOTE — Progress Notes (Signed)
Patient ID: Bobby [name], male   DOB: 02/07/1976, 38 y.o.   MRN: [id number] Salvatore Shear [id number] January 18, 1976 02/07/2014      Progress Note-Follow Up  Subjective  Chief Complaint  Chief Complaint  Patient presents with  . Medication Refill  . Injections    tdap and flu    HPI  Patient is a 38 year old male in today for routine medical care. Has not been in in a while due to job changes and life stressors. Is doing better now but has struggled, has not taken all of his meds routinely due to financial concerns but is ready to get started. Takes his Celexa and Zantac routinely with good results. No acute illness. Denies CP/palp/SOB/HA/congestion/fevers/GI or GU c/o. Taking meds as prescribed  Past Medical History  Diagnosis Date  . GERD (gastroesophageal reflux disease)   . Obesity     morbid  . Hyperlipidemia   . Hypertension   . Thyroid disease     Hypothyroid  . Tremor     idiopathic  . Lumbar back pain     with radiculopathy  . Hypothyroid 12/28/2010  . Fatigue 04/08/2011  . Paresthesia of skin 04/12/2011  . Vitamin B 12 deficiency 04/13/2011  . History of hypotestosteronemia 04/13/2011  . Paronychia 10/13/2011  . HTN (hypertension) 07/08/2010    Qualifier: Diagnosis of  By: Abner Greenspan MD, Misty Stanley    . Anemia   . OSA (obstructive sleep apnea)     cpap setting of 14  . ADD (attention deficit disorder with hyperactivity)     adult  . Depression with anxiety 04/12/2011  . 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 Inclusion cyst 07/08/2010    Qualifier: Diagnosis of  By: Abner Greenspan MD, Misty Stanley      Past Surgical History  Procedure Laterality Date  . Wisdom teeth exactraction  age 89  . Meatal surgery to urinary opening  age 47  . Cystectomy  10/2011    neck    Family History  Problem Relation Age of Onset  . Hypertension Mother   . Obesity Mother   . Cardiomyopathy Mother   . Arthritis Father   . Gout Father   . Hyperlipidemia Father   . Hypertension Father   . Cleft palate Father    . Other Father     disassociative fugue  . Obesity Father   . ADD / ADHD Daughter     ADHD  . Coronary artery disease Maternal Grandmother     s/p multiple MI's first one in late 55's  . Other Maternal Grandmother     CHF  . Cancer Maternal Grandmother     breast  . Other Maternal Grandfather     Essential tremors  . Cancer Maternal Grandfather     spinal/ smoker/brain  . Leukemia Paternal Grandmother   . Cancer Paternal Grandmother     leukemia  . Atrial fibrillation Paternal Grandfather 74  . Hypertension Paternal Grandfather   . Other Paternal Doctor, general practice  . Cancer Maternal Uncle     colon    History   Social History  . Marital Status: Married    Spouse Name: N/A    Number of Children: N/A  . Years of Education: N/A   Occupational History  . Not on file.   Social History Main Topics  . Smoking status: Never Smoker   . Smokeless tobacco: Never Used  . Alcohol Use: No     Comment: Rare - 1-2x/year  . Drug  Use: No  . Sexual Activity: Not on file   Other Topics Concern  . Not on file   Social History Narrative  . No narrative on file    Current Outpatient Prescriptions on File Prior to Visit  Medication Sig Dispense Refill  . aspirin 81 MG tablet Take 81 mg by mouth daily with breakfast.       . citalopram (CELEXA) 20 MG tablet Take 2 tablets (40 mg total) by mouth daily.  60 tablet  0  . naproxen sodium (ANAPROX) 220 MG tablet Take 220 mg by mouth 2 (two) times daily as needed.      . ranitidine (ZANTAC) 150 MG tablet TAKE ONE TABLET BY MOUTH AT BEDTIME. PATIENT NEEDS APPOINTMENT FOR ADDITIONAL REFILLS  15 tablet  0  . cyanocobalamin (,VITAMIN B-12,) 1000 MCG/ML injection INJECT 1 ML (1000 MCG TOTAL) INTO THE MUSCLE EVERY 30 DAYS  10 mL  2  . nebivolol (BYSTOLIC) 5 MG tablet Take 0.5 tablets (2.5 mg total) by mouth every morning.  35 tablet  0  . nitroGLYCERIN (NITROSTAT) 0.4 MG SL tablet Place 0.4 mg under the tongue every 5  (five) minutes as needed. As needed for chest pain, may repeat q 5 min X 3 doses, if no resolution go to ER       . Syringe/Needle, Disp, (SYRINGE 3CC/22GX1-1/2") 22G X 1-1/2" 3 ML MISC by Does not apply route. Use with testosterone injection      . testosterone cypionate (DEPO-TESTOSTERONE) 200 MG/ML injection Inject 1 mL (200 mg total) into the muscle every 14 (fourteen) days.  10 mL  1   No current facility-administered medications on file prior to visit.    Allergies  Allergen Reactions  . Adhesive [Tape] Itching and Rash    Review of Systems  Review of Systems  Constitutional: Positive for malaise/fatigue. Negative for fever.  HENT: Negative for congestion.   Eyes: Negative for discharge.  Respiratory: Negative for shortness of breath.   Cardiovascular: Negative for chest pain, palpitations and leg swelling.  Gastrointestinal: Negative for nausea, abdominal pain and diarrhea.  Genitourinary: Negative for dysuria.  Musculoskeletal: Negative for falls.  Skin: Negative for rash.  Neurological: Negative for loss of consciousness and headaches.  Endo/Heme/Allergies: Negative for polydipsia.  Psychiatric/Behavioral: Negative for depression and suicidal ideas. The patient is not nervous/anxious and does not have insomnia.     Objective  BP 110/80  Pulse 63  Temp(Src) 98.3 F (36.8 C) (Oral)  Ht  (1.854 m)  Wt 418 lb 1.3 oz (189.64 kg)  BMI 55.17 kg/m2  SpO2 96%  Physical Exam  Physical Exam  Constitutional: He is oriented to person, place, and time and well-developed, well-nourished, and in no distress. No distress.  HENT:  Head: Normocephalic and atraumatic.  Eyes: Conjunctivae are normal.  Neck: Neck supple. No thyromegaly present.  Cardiovascular: Normal rate, regular rhythm and normal heart sounds.   No murmur heard. Pulmonary/Chest: Effort normal and breath sounds normal. No respiratory distress.  Abdominal: He exhibits no distension and no mass. There is no  tenderness.  Musculoskeletal: He exhibits no edema.  Neurological: He is alert and oriented to person, place, and time.  Skin: Skin is warm.  Psychiatric: Memory, affect and judgment normal.    Lab Results  Component Value Date   TSH 2.454 12/11/2012   Lab Results  Component Value Date   WBC 8.9 12/03/2011   HGB 14.0 12/03/2011   HCT 40.7 12/03/2011   MCV 83.1 12/03/2011  PLT 387 12/03/2011   Lab Results  Component Value Date   CREATININE 1.03 12/03/2011   BUN 16 12/03/2011   NA 137 12/03/2011   K 4.3 12/03/2011   CL 102 12/03/2011   CO2 26 12/03/2011   Lab Results  Component Value Date   ALT 21 10/13/2011   AST 18 10/13/2011   ALKPHOS 57 10/13/2011   BILITOT 0.8 10/13/2011   Lab Results  Component Value Date   CHOL 219* 10/13/2011   Lab Results  Component Value Date   HDL 35.30* 10/13/2011   Lab Results  Component Value Date   LDLCALC 119* 04/08/2011   Lab Results  Component Value Date   TRIG 192.0* 10/13/2011   Lab Results  Component Value Date   CHOLHDL 6 10/13/2011     Assessment & Plan  Depression with anxiety Doing well on Citalopram despite numerous stressors given refills today  HTN (hypertension) Well controlled, no changes to meds. Encouraged heart healthy diet such as the DASH diet and exercise as tolerated.   GERD Avoid offending foods, start probiotics. Do not eat large meals in late evening and consider raising head of bed. Given refill on Zantac today  MORBID OBESITY Encouraged DASH diet, decrease po intake and increase exercise as tolerated. Needs 7-8 hours of sleep nightly. Avoid trans fats, eat small, frequent meals every 4-5 hours with lean proteins, complex carbs and healthy fats. Minimize simple carbs, GMO foods.  History of hypotestosteronemia Refilled testosterone today, check levels in 3 months  Vitamin B 12 deficiency Given refill on Vitamin B 12 today

## 2014-02-07 NOTE — Assessment & Plan Note (Signed)
Refilled testosterone today, check levels in 3 months

## 2014-02-07 NOTE — Assessment & Plan Note (Signed)
Encouraged DASH diet, decrease po intake and increase exercise as tolerated. Needs 7-8 hours of sleep nightly. Avoid trans fats, eat small, frequent meals every 4-5 hours with lean proteins, complex carbs and healthy fats. Minimize simple carbs, GMO foods. 

## 2014-02-07 NOTE — Assessment & Plan Note (Signed)
Doing well on Citalopram despite numerous stressors given refills today

## 2014-02-07 NOTE — Assessment & Plan Note (Signed)
Avoid offending foods, start probiotics. Do not eat large meals in late evening and consider raising head of bed. Given refill on Zantac today

## 2014-02-07 NOTE — Patient Instructions (Signed)
Add testosterone and vitamin B 12 to labs  Anemia, Nonspecific Anemia is a condition in which the concentration of red blood cells or hemoglobin in the blood is below normal. Hemoglobin is a substance in red blood cells that carries oxygen to the tissues of the body. Anemia results in not enough oxygen reaching these tissues.  CAUSES  Common causes of anemia include:   Excessive bleeding. Bleeding may be internal or external. This includes excessive bleeding from periods (in women) or from the intestine.   Poor nutrition.   Chronic kidney, thyroid, and liver disease.  Bone marrow disorders that decrease red blood cell production.  Cancer and treatments for cancer.  HIV, AIDS, and their treatments.  Spleen problems that increase red blood cell destruction.  Blood disorders.  Excess destruction of red blood cells due to infection, medicines, and autoimmune disorders. SIGNS AND SYMPTOMS   Minor weakness.   Dizziness.   Headache.  Palpitations.   Shortness of breath, especially with exercise.   Paleness.  Cold sensitivity.  Indigestion.  Nausea.  Difficulty sleeping.  Difficulty concentrating. Symptoms may occur suddenly or they may develop slowly.  DIAGNOSIS  Additional blood tests are often needed. These help your health care provider determine the best treatment. Your health care provider will check your stool for blood and look for other causes of blood loss.  TREATMENT  Treatment varies depending on the cause of the anemia. Treatment can include:   Supplements of iron, vitamin B12, or folic acid.   Hormone medicines.   A blood transfusion. This may be needed if blood loss is severe.   Hospitalization. This may be needed if there is significant continual blood loss.   Dietary changes.  Spleen removal. HOME CARE INSTRUCTIONS Keep all follow-up appointments. It often takes many weeks to correct anemia, and having your health care provider  check on your condition and your response to treatment is very important. SEEK IMMEDIATE MEDICAL CARE IF:   You develop extreme weakness, shortness of breath, or chest pain.   You become dizzy or have trouble concentrating.  You develop heavy vaginal bleeding.   You develop a rash.   You have bloody or black, tarry stools.   You faint.   You vomit up blood.   You vomit repeatedly.   You have abdominal pain.  You have a fever or persistent symptoms for more than 2-3 days.   You have a fever and your symptoms suddenly get worse.   You are dehydrated.  MAKE SURE YOU:  Understand these instructions.  Will watch your condition.  Will get help right away if you are not doing well or get worse. Document Released: 07/08/2004 Document Revised: 01/31/2013 Document Reviewed: 11/24/2012 [facility] Patient Information 2015 [place], [place]. This information is not intended to replace advice given to you by your health care provider. Make sure you discuss any questions you have with your health care provider.

## 2014-02-07 NOTE — Assessment & Plan Note (Signed)
Well controlled, no changes to meds. Encouraged heart healthy diet such as the DASH diet and exercise as tolerated.  °

## 2014-02-07 NOTE — Progress Notes (Signed)
Pre visit review using our clinic review tool, if applicable. No additional management support is needed unless otherwise documented below in the visit note. 

## 2014-08-13 ENCOUNTER — Other Ambulatory Visit: Payer: Self-pay | Admitting: Family Medicine

## 2014-09-13 ENCOUNTER — Encounter: Payer: Self-pay | Admitting: Nurse Practitioner

## 2014-09-13 ENCOUNTER — Ambulatory Visit (INDEPENDENT_AMBULATORY_CARE_PROVIDER_SITE_OTHER): Payer: 59 | Admitting: Nurse Practitioner

## 2014-09-13 VITALS — BP 129/82 | HR 75 | Temp 98.0°F | Ht 73.0 in | Wt >= 6400 oz

## 2014-09-13 DIAGNOSIS — Z862 Personal history of diseases of the blood and blood-forming organs and certain disorders involving the immune mechanism: Secondary | ICD-10-CM | POA: Diagnosis not present

## 2014-09-13 DIAGNOSIS — R5383 Other fatigue: Secondary | ICD-10-CM

## 2014-09-13 DIAGNOSIS — Z6841 Body Mass Index (BMI) 40.0 and over, adult: Secondary | ICD-10-CM | POA: Diagnosis not present

## 2014-09-13 LAB — CBC WITH DIFFERENTIAL/PLATELET
Basophils Absolute: 0 10*3/uL (ref 0.0–0.1)
Basophils Relative: 0.5 % (ref 0.0–3.0)
EOS ABS: 0.2 10*3/uL (ref 0.0–0.7)
Eosinophils Relative: 2.1 % (ref 0.0–5.0)
HEMATOCRIT: 38 % — AB (ref 39.0–52.0)
Hemoglobin: 12.9 g/dL — ABNORMAL LOW (ref 13.0–17.0)
LYMPHS ABS: 1.9 10*3/uL (ref 0.7–4.0)
Lymphocytes Relative: 24.9 % (ref 12.0–46.0)
MCHC: 33.9 g/dL (ref 30.0–36.0)
MCV: 82 fl (ref 78.0–100.0)
MONO ABS: 0.4 10*3/uL (ref 0.1–1.0)
Monocytes Relative: 5.4 % (ref 3.0–12.0)
NEUTROS PCT: 67.1 % (ref 43.0–77.0)
Neutro Abs: 5.1 10*3/uL (ref 1.4–7.7)
Platelets: 320 10*3/uL (ref 150.0–400.0)
RBC: 4.64 Mil/uL (ref 4.22–5.81)
RDW: 14.9 % (ref 11.5–15.5)
WBC: 7.6 10*3/uL (ref 4.0–10.5)

## 2014-09-13 LAB — COMPREHENSIVE METABOLIC PANEL
ALBUMIN: 4.1 g/dL (ref 3.5–5.2)
ALT: 18 U/L (ref 0–53)
AST: 15 U/L (ref 0–37)
Alkaline Phosphatase: 68 U/L (ref 39–117)
BUN: 15 mg/dL (ref 6–23)
CALCIUM: 9.4 mg/dL (ref 8.4–10.5)
CO2: 28 meq/L (ref 19–32)
Chloride: 102 mEq/L (ref 96–112)
Creatinine, Ser: 0.96 mg/dL (ref 0.40–1.50)
GFR: 92.68 mL/min (ref >60.00)
GLUCOSE: 86 mg/dL (ref 70–99)
POTASSIUM: 4.4 meq/L (ref 3.5–5.1)
SODIUM: 136 meq/L (ref 135–145)
TOTAL PROTEIN: 7.4 g/dL (ref 6.0–8.3)
Total Bilirubin: 0.7 mg/dL (ref 0.2–1.2)

## 2014-09-13 LAB — HEMOGLOBIN A1C: Hgb A1c MFr Bld: 6 % (ref 4.6–6.5)

## 2014-09-13 LAB — VITAMIN B12: Vitamin B-12: 260 pg/mL (ref 211–911)

## 2014-09-13 LAB — SEDIMENTATION RATE: Sed Rate: 33 mm/hr — ABNORMAL HIGH (ref 0–22)

## 2014-09-13 LAB — T4, FREE: FREE T4: 0.79 ng/dL (ref 0.60–1.60)

## 2014-09-13 LAB — TSH: TSH: 3.65 u[IU]/mL (ref 0.35–4.50)

## 2014-09-13 NOTE — Progress Notes (Signed)
Subjective:     Bobby DeutscherWilliam [name] is a 39 y.o. male who presents for evaluation of fatigue. Symptoms began several days ago. The patient feels the fatigue began with: headache-worst HA -started at work like "regular" HA. Pain progressed into evening. Ate dinner, pain got worse. Took 2 T aleve, went to bed. Pain woke from sleep, took additional 2T aleve. Slept until morning. Ha resolved, but felt "drained & tired". Took day of work yesterday & realized he has multiple arthralgia-knees, neck, back.  Pt has Hx pernicious anemia. Not taking B12.  He is morbidly obese, feels SOB with 2-3 stairs, legs swell if on feet for few hours. Eats fast food every day, drinks soda all day. No exercise.  The following portions of the patient's history were reviewed and updated as appropriate: allergies, current medications, past medical history, past social history, past surgical history and problem list.  Review of Systems Constitutional: negative for fevers Eyes: negative for visual disturbance Ears, nose, mouth, throat, and face: negative for earaches, hoarseness, nasal congestion and sore throat Cardiovascular: negative for palpitations Gastrointestinal: positive for constipation Musculoskeletal:negative for swollen, hot, red joints Neurological: negative for coordination problems, dizziness and paresthesia Endocrine: negative for temperature intolerance   Skin: denies rash   Objective:    BP 129/82 mmHg  Pulse 75  Temp(Src) 98 F (36.7 C) (Oral)  Ht 6\' 1"  (1.854 m)  Wt 421 lb (190.964 kg)  BMI 55.56 kg/m2  SpO2 97% General appearance: alert, cooperative, appears stated age, no distress and morbidly obese Head: Normocephalic, without obvious abnormality, atraumatic Eyes: negative findings: lids and lashes normal and conjunctivae and sclerae normal Ears: normal TM's and external ear canals both ears Throat: lips, mucosa, and tongue normal; teeth and gums normal Neck: no adenopathy, no carotid bruit,  supple, symmetrical, trachea midline, thyroid not enlarged, symmetric, no tenderness/mass/nodules and thick neck Lungs: clear to auscultation bilaterally Heart: regular rate and rhythm, S1, S2 normal, no murmur, click, rub or gallop Extremities: edema +1 pretibial pitting edema, bilat Pulses: 2+ and symmetric   Assessment:Plan   1. Other fatigue ZO:[id number]:post-migraine, anemia, thyroid disease, poor diet, lack of exercise - CBC with Differential/Platelet - Comprehensive metabolic panel - Sedimentation rate - TSH - T4, free - Vitamin B12 - Hemoglobin A1c  2. BMI 50.0-59.9, adult See pt instructions Discussed diet changes, gave HO Activity changes - TSH - T4, free - Hemoglobin A1c  F/u w/Bobby [name] as scheduled

## 2014-09-13 NOTE — Progress Notes (Signed)
Pre visit review using our clinic review tool, if applicable. No additional management support is needed unless otherwise documented below in the visit note. 

## 2014-09-13 NOTE — Patient Instructions (Signed)
My office will call with lab results.  Keep appt w/Dr Abner GreenspanBlyth.  Please cut out soda. Replace with water, club soda/seltzer water & lemon or lime, or [place]. Cut out sweet foods except fresh fruit.   Convenience foods are BAD! When you have to eat on the run, get salads- Skip the cheese & fried meats. Gets nuts & seeds on salads instead.  Also, GO to places where you can get cooked vegetables-[employer], [employer], [employer]. Get pintos, green beans, slaw instead of fries, fried chicken & biscuits.   Cook at home as often as you can.  Walk 30 minutes every day.

## 2014-09-16 ENCOUNTER — Telehealth: Payer: Self-pay | Admitting: Nurse Practitioner

## 2014-09-16 NOTE — Telephone Encounter (Signed)
LMOVM for patient to return call concerning lab results.  

## 2014-09-16 NOTE — Telephone Encounter (Signed)
pls call pt: Advise Fatigue is likely due to anemia-hgb is 12, should be 15. B12 is low. He should start weekly B12 injections.  Pls sched OV to discuss labs.  DO NOT INCLUDE IN MSG Thyroid looks OK, but should be monitored q6 mo to 1 yr. He is pre-diabetic.  Sedimentation rate is elevated-this may be due to obesity, but should be f/u w/ ANA/rheum w/u.

## 2014-09-17 NOTE — Telephone Encounter (Signed)
LMOVM for patient to return call concerning lab results. Mychart message sent with results.

## 2014-09-18 NOTE — Telephone Encounter (Signed)
No call back, patient has an appointment with his PCP on 09/30/14.

## 2014-09-30 ENCOUNTER — Ambulatory Visit: Payer: Self-pay | Admitting: Family Medicine

## 2014-09-30 ENCOUNTER — Ambulatory Visit (INDEPENDENT_AMBULATORY_CARE_PROVIDER_SITE_OTHER): Payer: 59 | Admitting: Family Medicine

## 2014-09-30 ENCOUNTER — Encounter: Payer: Self-pay | Admitting: Family Medicine

## 2014-09-30 DIAGNOSIS — R7989 Other specified abnormal findings of blood chemistry: Secondary | ICD-10-CM

## 2014-09-30 DIAGNOSIS — E538 Deficiency of other specified B group vitamins: Secondary | ICD-10-CM

## 2014-09-30 DIAGNOSIS — I10 Essential (primary) hypertension: Secondary | ICD-10-CM | POA: Diagnosis not present

## 2014-09-30 DIAGNOSIS — G4730 Sleep apnea, unspecified: Secondary | ICD-10-CM

## 2014-09-30 DIAGNOSIS — Z8639 Personal history of other endocrine, nutritional and metabolic disease: Secondary | ICD-10-CM

## 2014-09-30 DIAGNOSIS — E291 Testicular hypofunction: Secondary | ICD-10-CM

## 2014-09-30 DIAGNOSIS — K219 Gastro-esophageal reflux disease without esophagitis: Secondary | ICD-10-CM

## 2014-09-30 DIAGNOSIS — D510 Vitamin B12 deficiency anemia due to intrinsic factor deficiency: Secondary | ICD-10-CM | POA: Diagnosis not present

## 2014-09-30 DIAGNOSIS — R5383 Other fatigue: Secondary | ICD-10-CM

## 2014-09-30 LAB — TESTOSTERONE: TESTOSTERONE: 175.48 ng/dL — AB (ref 300.00–890.00)

## 2014-09-30 MED ORDER — CITALOPRAM HYDROBROMIDE 20 MG PO TABS
20.0000 mg | ORAL_TABLET | Freq: Every day | ORAL | Status: DC
Start: 2014-09-30 — End: 2015-02-18

## 2014-09-30 MED ORDER — CYANOCOBALAMIN 1000 MCG/ML IJ SOLN: 1000.0000 ug | INTRAMUSCULAR | Status: DC

## 2014-09-30 NOTE — Patient Instructions (Addendum)
Encouraged increased hydration, 64 ounces of clear fluids daily. Minimize alcohol and caffeine. Eat small frequent meals with lean proteins and complex carbs. Avoid high and low blood sugars. Get adequate sleep, 7-8 hours a night. Needs exercise daily preferably in the morning.  Migraine Headache A migraine headache is an intense, throbbing pain on one or both sides of your head. A migraine can last for 30 minutes to several hours. CAUSES  The exact cause of a migraine headache is not always known. However, a migraine may be caused when nerves in the brain become irritated and release chemicals that cause inflammation. This causes pain. Certain things may also trigger migraines, such as:  Alcohol.  Smoking.  Stress.  Menstruation.  Aged cheeses.  Foods or drinks that contain nitrates, glutamate, aspartame, or tyramine.  Lack of sleep.  Chocolate.  Caffeine.  Hunger.  Physical exertion.  Fatigue.  Medicines used to treat chest pain (nitroglycerine), birth control pills, estrogen, and some blood pressure medicines. SIGNS AND SYMPTOMS  Pain on one or both sides of your head.  Pulsating or throbbing pain.  Severe pain that prevents daily activities.  Pain that is aggravated by any physical activity.  Nausea, vomiting, or both.  Dizziness.  Pain with exposure to bright lights, loud noises, or activity.  General sensitivity to bright lights, loud noises, or smells. Before you get a migraine, you may get warning signs that a migraine is coming (aura). An aura may include:  Seeing flashing lights.  Seeing bright spots, halos, or zigzag lines.  Having tunnel vision or blurred vision.  Having feelings of numbness or tingling.  Having trouble talking.  Having muscle weakness. DIAGNOSIS  A migraine headache is often diagnosed based on:  Symptoms.  Physical exam.  A CT scan or MRI of your head. These imaging tests cannot diagnose migraines, but they can help  rule out other causes of headaches. TREATMENT Medicines may be given for pain and nausea. Medicines can also be given to help prevent recurrent migraines.  HOME CARE INSTRUCTIONS  Only take over-the-counter or prescription medicines for pain or discomfort as directed by your health care provider. The use of long-term narcotics is not recommended.  Lie down in a dark, quiet room when you have a migraine.  Keep a journal to find out what may trigger your migraine headaches. For example, write down:  What you eat and drink.  How much sleep you get.  Any change to your diet or medicines.  Limit alcohol consumption.  Quit smoking if you smoke.  Get 7-9 hours of sleep, or as recommended by your health care provider.  Limit stress.  Keep lights dim if bright lights bother you and make your migraines worse. SEEK IMMEDIATE MEDICAL CARE IF:   Your migraine becomes severe.  You have a fever.  You have a stiff neck.  You have vision loss.  You have muscular weakness or loss of muscle control.  You start losing your balance or have trouble walking.  You feel faint or pass out.  You have severe symptoms that are different from your first symptoms. MAKE SURE YOU:   Understand these instructions.  Will watch your condition.  Will get help right away if you are not doing well or get worse. Document Released: 05/31/2005 Document Revised: 10/15/2013 Document Reviewed: 02/05/2013 [employer] Patient Information 2015 [employer]. This information is not intended to replace advice given to you by your health care provider. Make sure you discuss any questions you have with   your health care provider.  

## 2014-10-01 ENCOUNTER — Telehealth: Payer: Self-pay | Admitting: Family Medicine

## 2014-10-01 DIAGNOSIS — E349 Endocrine disorder, unspecified: Secondary | ICD-10-CM

## 2014-10-01 NOTE — Telephone Encounter (Signed)
Instructions for Androgel 50 mg 4 pumps daily. Which % and gram combination should be sent in.

## 2014-10-01 NOTE — Telephone Encounter (Signed)
1 

## 2014-10-02 MED ORDER — TESTOSTERONE 50 MG/5GM (1%) TD GEL: TRANSDERMAL | Status: DC

## 2014-10-02 NOTE — Telephone Encounter (Signed)
Awaiting signature patient notified prescription will be available after lunch tomrrow.

## 2014-10-03 NOTE — Telephone Encounter (Signed)
Faxed hardcopy for Androgel to [facility] in [place] as patient requested.

## 2014-10-06 ENCOUNTER — Encounter: Payer: Self-pay | Admitting: Family Medicine

## 2014-10-06 NOTE — Assessment & Plan Note (Signed)
Remains low, will restart topical treatments due to patient is symptomatic.

## 2014-10-06 NOTE — Assessment & Plan Note (Signed)
Has responded to testosterone treatments in the past. Will restart.

## 2014-10-06 NOTE — Assessment & Plan Note (Signed)
Encouraged DASH diet, decrease po intake and increase exercise as tolerated. Needs 7-8 hours of sleep nightly. Avoid trans fats, eat small, frequent meals every 4-5 hours with lean proteins, complex carbs and healthy fats. Minimize simple carbs, referred to nutritionist

## 2014-10-06 NOTE — Assessment & Plan Note (Signed)
Avoid offending foods, start probiotics. Do not eat large meals in late evening and consider raising head of bed.  

## 2014-10-06 NOTE — Assessment & Plan Note (Signed)
Will restart shots monthly

## 2014-10-06 NOTE — Assessment & Plan Note (Signed)
Well controlled, no changes to meds. Encouraged heart healthy diet such as the DASH diet and exercise as tolerated.  °

## 2014-10-06 NOTE — Progress Notes (Signed)
Bobby [name]  [id number] 1976-03-23 10/06/2014      Progress Note-Follow Up  Subjective  Chief Complaint  Chief Complaint  Patient presents with  . Follow-up    lab results from     HPI  Patient is a 39 y.o. male in today for routine medical care. Patient had lost his insurance coverage for a while so is in today to restart medications and care. He feels fairly well but has been without his vitamin B12 and testosterone for quite some time. Recently did have a bad headache that included photophobia that this is very unusual for him and he has not noted a recurrence. He denies any recent febrile illness. Continues to use his CPAP regularly. Complains of fatigue but no other acute concerns. Denies CP/palp/SOB/HA/congestion/fevers/GI or GU c/o. Taking meds as prescribed  Past Medical History  Diagnosis Date  . GERD (gastroesophageal reflux disease)   . Obesity     morbid  . Hyperlipidemia   . Hypertension   . Thyroid disease     Hypothyroid  . Tremor     idiopathic  . Lumbar back pain     with radiculopathy  . Hypothyroid 12/28/2010  . Fatigue 04/08/2011  . Paresthesia of skin 04/12/2011  . Vitamin B 12 deficiency 04/13/2011  . History of hypotestosteronemia 04/13/2011  . Paronychia 10/13/2011  . HTN (hypertension) 07/08/2010    Qualifier: Diagnosis of  By: Abner Greenspan MD, Misty Stanley    . Anemia   . OSA (obstructive sleep apnea)     cpap setting of 14  . ADD (attention deficit disorder with hyperactivity)     adult  . Depression with anxiety 04/12/2011  . 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 Inclusion cyst 07/08/2010    Qualifier: Diagnosis of  By: Abner Greenspan MD, Misty Stanley      Past Surgical History  Procedure Laterality Date  . Wisdom teeth exactraction  age 43  . Meatal surgery to urinary opening  age 68  . Cystectomy  10/2011    neck    Family History  Problem Relation Age of Onset  . Hypertension Mother   . Obesity Mother   . Cardiomyopathy Mother   . Arthritis Father   . Gout  Father   . Hyperlipidemia Father   . Hypertension Father   . Cleft palate Father   . Other Father     disassociative fugue  . Obesity Father   . ADD / ADHD Daughter     ADHD  . Coronary artery disease Maternal Grandmother     s/p multiple MI's first one in late 12's  . Other Maternal Grandmother     CHF  . Cancer Maternal Grandmother     breast  . Other Maternal Grandfather     Essential tremors  . Cancer Maternal Grandfather     spinal/ smoker/brain  . Leukemia Paternal Grandmother   . Cancer Paternal Grandmother     leukemia  . Atrial fibrillation Paternal Grandfather 66  . Hypertension Paternal Grandfather   . Other Paternal Doctor, general practice  . Cancer Maternal Uncle     colon    History   Social History  . Marital Status: Married    Spouse Name: N/A  . Number of Children: N/A  . Years of Education: N/A   Occupational History  . Not on file.   Social History Main Topics  . Smoking status: Never Smoker   . Smokeless tobacco: Never Used  . Alcohol Use: No  Comment: Rare - 1-2x/year  . Drug Use: No  . Sexual Activity: Not on file   Other Topics Concern  . Not on file   Social History Narrative    Current Outpatient Prescriptions on File Prior to Visit  Medication Sig Dispense Refill  . aspirin 81 MG tablet Take 81 mg by mouth daily with breakfast.     . naproxen sodium (ANAPROX) 220 MG tablet Take 220 mg by mouth 2 (two) times daily as needed.    . nitroGLYCERIN (NITROSTAT) 0.4 MG SL tablet Place 0.4 mg under the tongue every 5 (five) minutes as needed. As needed for chest pain, may repeat q 5 min X 3 doses, if no resolution go to ER     . ranitidine (ZANTAC) 150 MG tablet Take 1 tablet (150 mg total) by mouth at bedtime. 30 tablet 0  . nebivolol (BYSTOLIC) 5 MG tablet Take 0.5 tablets (2.5 mg total) by mouth daily as needed. (Patient not taking: Reported on 09/30/2014) 35 tablet 0  . Syringe/Needle, Disp, (SYRINGE 3CC/22GX1-1/2") 22G  X 1-1/2" 3 ML MISC by Does not apply route. Use with testosterone injection    . testosterone cypionate (DEPO-TESTOSTERONE) 200 MG/ML injection Inject 1 mL (200 mg total) into the muscle every 14 (fourteen) days. 10 mL 1   No current facility-administered medications on file prior to visit.    Allergies  Allergen Reactions  . Adhesive [Tape] Itching and Rash    Review of Systems  Review of Systems  Constitutional: Positive for malaise/fatigue. Negative for fever.  HENT: Negative for congestion.   Eyes: Negative for discharge.  Respiratory: Negative for shortness of breath.   Cardiovascular: Negative for chest pain, palpitations and leg swelling.  Gastrointestinal: Negative for nausea, abdominal pain and diarrhea.  Genitourinary: Negative for dysuria.  Musculoskeletal: Negative for falls.  Skin: Negative for rash.  Neurological: Positive for headaches. Negative for loss of consciousness.  Endo/Heme/Allergies: Negative for polydipsia.  Psychiatric/Behavioral: Negative for depression and suicidal ideas. The patient is nervous/anxious. The patient does not have insomnia.     Objective  There were no vitals taken for this visit.  Physical Exam  Physical Exam  Constitutional: He is oriented to person, place, and time and well-developed, well-nourished, and in no distress. No distress.  obesity  HENT:  Head: Normocephalic and atraumatic.  Eyes: Conjunctivae are normal.  Neck: Neck supple. No thyromegaly present.  Cardiovascular: Normal rate, regular rhythm and normal heart sounds.   No murmur heard. Pulmonary/Chest: Effort normal and breath sounds normal. No respiratory distress.  Abdominal: He exhibits no distension and no mass. There is no tenderness.  Musculoskeletal: He exhibits no edema.  Neurological: He is alert and oriented to person, place, and time.  Skin: Skin is warm.  Psychiatric: Memory, affect and judgment normal.    Lab Results  Component Value Date   TSH  3.65 09/13/2014   Lab Results  Component Value Date   WBC 7.6 09/13/2014   HGB 12.9* 09/13/2014   HCT 38.0* 09/13/2014   MCV 82.0 09/13/2014   PLT 320.0 09/13/2014   Lab Results  Component Value Date   CREATININE 0.96 09/13/2014   BUN 15 09/13/2014   NA 136 09/13/2014   K 4.4 09/13/2014   CL 102 09/13/2014   CO2 28 09/13/2014   Lab Results  Component Value Date   ALT 18 09/13/2014   AST 15 09/13/2014   ALKPHOS 68 09/13/2014   BILITOT 0.7 09/13/2014   Lab Results  Component Value  Date   CHOL 219* 10/13/2011   Lab Results  Component Value Date   HDL 35.30* 10/13/2011   Lab Results  Component Value Date   LDLCALC 119* 04/08/2011   Lab Results  Component Value Date   TRIG 192.0* 10/13/2011   Lab Results  Component Value Date   CHOLHDL 6 10/13/2011     Assessment & Plan  HTN (hypertension) Well controlled, no changes to meds. Encouraged heart healthy diet such as the DASH diet and exercise as tolerated.    GERD Avoid offending foods, start probiotics. Do not eat large meals in late evening and consider raising head of bed.    History of hypotestosteronemia Remains low, will restart topical treatments due to patient is symptomatic.    Fatigue Has responded to testosterone treatments in the past. Will restart.   MORBID OBESITY Encouraged DASH diet, decrease po intake and increase exercise as tolerated. Needs 7-8 hours of sleep nightly. Avoid trans fats, eat small, frequent meals every 4-5 hours with lean proteins, complex carbs and healthy fats. Minimize simple carbs, referred to nutritionist   Sleep apnea Uses CPAP routinely   Vitamin B 12 deficiency Will restart shots monthly

## 2014-10-06 NOTE — Assessment & Plan Note (Signed)
Uses CPAP routinely 

## 2014-10-16 ENCOUNTER — Telehealth: Payer: Self-pay | Admitting: *Deleted

## 2014-10-16 NOTE — Telephone Encounter (Signed)
Prior authorization for Androgel pump initiated. Awaiting determination from [employer]. JG//CMA

## 2014-10-17 NOTE — Telephone Encounter (Signed)
testim 1%, 50 mg/5gm gel tube. Apply 1 tube each am as directed, disp #30 tubes with 3 rf

## 2014-10-17 NOTE — Telephone Encounter (Signed)
PA denied. Insurance will cover Androderm or brand Testim. Please advise. JG//CMA

## 2014-10-18 MED ORDER — TESTOSTERONE 50 MG/5GM (1%) TD GEL: 5.0000 g | Freq: Every day | TRANSDERMAL | Status: DC

## 2014-10-18 NOTE — Telephone Encounter (Signed)
[facility]ax[facility] Testim to [facility] [place].

## 2014-10-18 NOTE — Addendum Note (Signed)
Addended by: Scharlene GlossEWING, ROBIN B on: 10/18/2014 08:05 AM   Modules accepted: Orders, Medications

## 2014-10-18 NOTE — Telephone Encounter (Signed)
Printed and on counter for signature. 

## 2014-10-23 ENCOUNTER — Telehealth: Payer: Self-pay | Admitting: *Deleted

## 2014-10-23 NOTE — Telephone Encounter (Signed)
PA for androgel initiated. Awaiting determination. JG//CMA

## 2014-10-29 NOTE — Telephone Encounter (Signed)
Pt switched to Testim

## 2014-11-25 ENCOUNTER — Encounter: Payer: Self-pay | Admitting: Nurse Practitioner

## 2014-11-25 ENCOUNTER — Ambulatory Visit (INDEPENDENT_AMBULATORY_CARE_PROVIDER_SITE_OTHER): Payer: 59 | Admitting: Nurse Practitioner

## 2014-11-25 VITALS — BP 137/81 | HR 74 | Temp 97.8°F | Resp 20 | Ht 73.0 in | Wt >= 6400 oz

## 2014-11-25 DIAGNOSIS — S99929A Unspecified injury of unspecified foot, initial encounter: Secondary | ICD-10-CM | POA: Insufficient documentation

## 2014-11-25 DIAGNOSIS — M5416 Radiculopathy, lumbar region: Secondary | ICD-10-CM | POA: Diagnosis not present

## 2014-11-25 DIAGNOSIS — S99922S Unspecified injury of left foot, sequela: Secondary | ICD-10-CM | POA: Diagnosis not present

## 2014-11-25 HISTORY — DX: Radiculopathy, lumbar region: M54.16

## 2014-11-25 MED ORDER — METHYLPREDNISOLONE ACETATE 40 MG/ML IJ SUSP
40.0000 mg | Freq: Once | INTRAMUSCULAR | Status: AC
  Administered 2014-11-25: 40 mg via INTRA_ARTICULAR

## 2014-11-25 MED ORDER — METHOCARBAMOL 750 MG PO TABS: 750.0000 mg | ORAL_TABLET | Freq: Every evening | ORAL | Status: DC | PRN

## 2014-11-25 MED ORDER — PREDNISONE 10 MG PO TABS: ORAL_TABLET | ORAL | Status: DC

## 2014-11-25 NOTE — Progress Notes (Signed)
Subjective:    Bobby [name] is a 39 y.o. male who presents for evaluation of low back pain. Mr [place] weighs 416 lbs. He had L foot injury 3 weeks ago & is wearing orthotic boot & using cane. The patient has had no prior back problems. Symptoms have been present for 9 days and are gradually worsening.  Onset was related to / precipitated by lifting a heavy object-standing up motorcycles, bent over to pick up traffic cone & felt pop accompanied by burning & fluid sensation that lasted less than 1 minute. Next day he could barely get out bed. The pain is located in the left lumbar area and has begun to  radiates to the buttock, bilateral in last few days. The pain is described as aching, burning and stiffness and occurs all day. He is waking at night w/ back spasm. He rates his pain as moderate. Symptoms are exacerbated by standing and getting out of bed & certain stretches. Symptoms are improved by some stretches are helpful. He is taking 2 T aleve 2-3 times daily, but can't tell if relieving pain. Marland Kitchen He has weakness in the right leg and lack of coordination in both legs when first gets out of bed.  He denies bowel or blaDDER incontinence.    The following portions of the patient's history were reviewed and updated as appropriate: allergies, current medications, past medical history, past social history, past surgical history and problem list.  Review of Systems Pertinent items are noted in HPI.    Objective:   Inspection and palpation: spinal tenderness noted low thoracic & lumbar, sacroiliac tenderness noted L, antalgic gait. Muscle tone and ROM exam: antalgic gait. Straight leg raise: sitting-R leg raise illicits pain L back at 100 degrees .Marland Kitchen Neurological: decreased muscle strength of R leg, grade 3+.    Assessment:Plan  1. Lumbar radicular pain New DD: disc herniation, vertebral fracture Morbid obesity - predniSONE (DELTASONE) 10 MG tablet; Starting tomorrow, Take 4Tpo qam X 2d, then 3T po  qam X 3d, then 2T po qd X 3d, then 1T po qam X 3d.  Dispense: 26 tablet; Refill: 0 - methocarbamol (ROBAXIN-750) 750 MG tablet; Take 1 tablet (750 mg total) by mouth at bedtime as needed for muscle spasms.  Dispense: 20 tablet; Refill: 0 - Ambulatory referral to Spine Surgery - methylPREDNISolone acetate (DEPO-MEDROL) injection 40 mg; Inject 1 mL (40 mg total) into the articular space once.  2. Foot injury, left, sequela Antalgic gait, weraing orthotic boot, using cane May aggravate back pain See pt instructions

## 2014-11-25 NOTE — Progress Notes (Signed)
Pre visit review using our clinic review tool, if applicable. No additional management support is needed unless otherwise documented below in the visit note. 

## 2014-11-25 NOTE — Patient Instructions (Signed)
Please start prednisone tomorrow morning. Start robaxin tonight. Do not drive when taking this medicine.  Take 1000 mg tylenol 3 times daily as needed.  Do not take NSAIDS with prednisone.  Please see back specialist.

## 2014-11-26 ENCOUNTER — Telehealth: Payer: Self-pay | Admitting: Nurse Practitioner

## 2014-11-26 NOTE — Telephone Encounter (Signed)
Patient is scheduled with [facility] on 12/10/14. It was the first available appt they had. Patient contacted them to ask if they had any cancellations as that he is anxious to be seen. When he called they said the appointment is only a consultation. Patient would like to have an MRI done now so that he can have the results for the appointment available.

## 2014-11-26 NOTE — Telephone Encounter (Signed)
Pt is fine waiting 10dys to see [facility] He will let us know if he would like to try to get in to [facility] sooner. He reports No exacerbation of symptoms.

## 2014-12-05 ENCOUNTER — Ambulatory Visit (INDEPENDENT_AMBULATORY_CARE_PROVIDER_SITE_OTHER): Payer: 59 | Admitting: Internal Medicine

## 2014-12-05 ENCOUNTER — Encounter: Payer: Self-pay | Admitting: Internal Medicine

## 2014-12-05 VITALS — BP 128/72 | HR 81 | Ht 73.0 in | Wt >= 6400 oz

## 2014-12-05 DIAGNOSIS — G4733 Obstructive sleep apnea (adult) (pediatric): Secondary | ICD-10-CM

## 2014-12-05 NOTE — Assessment & Plan Note (Signed)
His weight up 100 pounds since his sleep study but he says he is getting it down again with a goal of getting under 300 pounds eventually

## 2014-12-05 NOTE — Assessment & Plan Note (Signed)
Documented severe obstructive sleep apnea before he gained nearly 100 pounds. By his report he has been very compliant and successful with CPAP. Doesn't know what DME company he had. We will get him reestablished for replacement supplies and auto titrate to reassess pressure. Education sleep hygiene, driving responsibility, importance of weight loss.

## 2014-12-05 NOTE — Progress Notes (Signed)
12/05/14- 39 yoM never smoker Referred by Dr. Abner Greenspan for sleep apnea.  pt had sleep study in 2006.  Pt currently uses cpap daily, needs new supplies.  pt unsure of dme provider.   NPSG 2006 AHI 72, weight 332 lbs 14 cwp   Now up to 411 lbs He had lost contact with his DME company but says he loves his CPAP and uses it all night every night. Mask is worn out. Wife does not tell him he snores when he wears it and he feels rested. ENT-none;   no cardiopulmonary disease  Prior to Admission medications   Medication Sig Start Date End Date Taking? Authorizing Provider  amphetamine-dextroamphetamine (ADDERALL XR) 20 MG 24 hr capsule Take 20 mg by mouth daily.   Yes Historical Provider, MD  aspirin 81 MG tablet Take 81 mg by mouth daily with breakfast.    Yes Historical Provider, MD  citalopram (CELEXA) 20 MG tablet Take 1 tablet (20 mg total) by mouth daily. 09/30/14  Yes Bradd Canary, MD  cyanocobalamin (,VITAMIN B-12,) 1000 MCG/ML injection Inject 1 mL (1,000 mcg total) into the muscle every 30 (thirty) days. 09/30/14  Yes Bradd Canary, MD  methocarbamol (ROBAXIN-750) 750 MG tablet Take 1 tablet (750 mg total) by mouth at bedtime as needed for muscle spasms. 11/25/14  Yes Kelle Darting, NP  nitroGLYCERIN (NITROSTAT) 0.4 MG SL tablet Place 0.4 mg under the tongue every 5 (five) minutes as needed. As needed for chest pain, may repeat q 5 min X 3 doses, if no resolution go to ER    Yes Historical Provider, MD  ranitidine (ZANTAC) 150 MG tablet Take 1 tablet (150 mg total) by mouth at bedtime. 02/07/14  Yes Bradd Canary, MD   Past Medical History  Diagnosis Date  . GERD (gastroesophageal reflux disease)   . Obesity     morbid  . Hyperlipidemia   . Hypertension   . Thyroid disease     Hypothyroid  . Tremor     idiopathic  . Lumbar back pain     with radiculopathy  . Hypothyroid 12/28/2010  . Fatigue 04/08/2011  . Paresthesia of skin 04/12/2011  . Vitamin B 12 deficiency 04/13/2011  . History of  hypotestosteronemia 04/13/2011  . Paronychia 10/13/2011  . HTN (hypertension) 07/08/2010    Qualifier: Diagnosis of  By: Abner Greenspan MD, Misty Stanley    . Anemia   . OSA (obstructive sleep apnea)     cpap setting of 14  . ADD (attention deficit disorder with hyperactivity)     adult  . Depression with anxiety 04/12/2011  . 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 Inclusion cyst 07/08/2010    Qualifier: Diagnosis of  By: Abner Greenspan MD, Misty Stanley     Past Surgical History  Procedure Laterality Date  . Wisdom teeth exactraction  age 51  . Meatal surgery to urinary opening  age 62  . Cystectomy  10/2011    neck   Family History  Problem Relation Age of Onset  . Hypertension Mother   . Obesity Mother   . Cardiomyopathy Mother   . Arthritis Father   . Gout Father   . Hyperlipidemia Father   . Hypertension Father   . Cleft palate Father   . Other Father     disassociative fugue  . Obesity Father   . ADD / ADHD Daughter     ADHD  . Coronary artery disease Maternal Grandmother     s/p multiple MI's first one in late  30's  . Other Maternal Grandmother     CHF  . Cancer Maternal Grandmother     breast  . Other Maternal Grandfather     Essential tremors  . Cancer Maternal Grandfather     spinal/ smoker/brain  . Leukemia Paternal Grandmother   . Cancer Paternal Grandmother     leukemia  . Atrial fibrillation Paternal Grandfather 20  . Hypertension Paternal Grandfather   . Other Paternal Doctor, general practice  . Cancer Maternal Uncle     colon   History   Social History  . Marital Status: Married    Spouse Name: N/A  . Number of Children: N/A  . Years of Education: N/A   Occupational History  . Not on file.   Social History Main Topics  . Smoking status: Never Smoker   . Smokeless tobacco: Never Used  . Alcohol Use: No     Comment: Rare - 1-2x/year  . Drug Use: No  . Sexual Activity: Not on file   Other Topics Concern  . Not on file   Social History Narrative   ROS-see HPI    Negative unless "+" Constitutional:    weight loss, night sweats, fevers, chills, fatigue, lassitude. HEENT:    headaches, difficulty swallowing, tooth/dental problems, sore throat,       sneezing, itching, ear ache, +nasal congestion, post nasal drip, snoring CV:    chest pain, orthopnea, PND, swelling in lower extremities, anasarca,                                  dizziness, palpitations Resp:   shortness of breath with exertion or at rest.                productive cough,   non-productive cough, coughing up of blood.              change in color of mucus.  wheezing.   Skin:    rash or lesions. GI:  No-   Heartburn,+ indigestion, abdominal pain, nausea, vomiting, diarrhea,                 change in bowel habits, loss of appetite GU: dysuria, change in color of urine, no urgency or frequency.   flank pain. MS:   joint pain, stiffness, decreased range of motion, back pain. Neuro-     nothing unusual Psych:  change in mood or affect.  depression or anxiety.   memory loss.  OBJ- Physical Exam General- Alert, Oriented, Affect-appropriate, Distress- none acute,                 +morbid obesity Skin- rash-none, lesions- none, excoriation- none Lymphadenopathy- none Head- atraumatic            Eyes- Gross vision intact, PERRLA, conjunctivae and secretions clear            Ears- Hearing, canals-normal            Nose- Clear, no-Septal dev, mucus, polyps, erosion, perforation             Throat- Mallampati IV , mucosa clear , drainage- none, tonsils- atrophic Neck- flexible , trachea midline, no stridor , thyroid nl, carotid no bruit Chest - symmetrical excursion , unlabored           Heart/CV- RRR , no murmur , no gallop  , no rub, nl s1 s2                           -  JVD- none , edema- none, stasis changes- none, varices- none           Lung- clear to P&A, wheeze- none, cough- none , dullness-none, rub- none           Chest wall-  Abd-  Br/ Gen/ Rectal- Not done, not indicated Extrem-  cyanosis- none, clubbing, none, atrophy- none, strength- nl, left foot in boot after stress fracture Neuro- grossly intact to observation

## 2014-12-05 NOTE — Patient Instructions (Signed)
Order- Re-establish or new DME to continue CPAP, evaluate machine for replacement, mask of choice, humidifier, supplies  Dx OSA  Please call as needed

## 2014-12-09 ENCOUNTER — Telehealth: Payer: Self-pay | Admitting: Internal Medicine

## 2014-12-09 NOTE — Telephone Encounter (Signed)
Received call from [place] at [facility], he says that they no longer [facility] and patient will need another carrier for his supplies. Order has already been entered to [facility] on 12/05/2014.  Nothing further needed.

## 2015-01-27 ENCOUNTER — Encounter: Payer: 59 | Admitting: Internal Medicine

## 2015-02-18 ENCOUNTER — Other Ambulatory Visit: Payer: Self-pay | Admitting: Family Medicine

## 2015-03-10 ENCOUNTER — Ambulatory Visit: Payer: 59 | Admitting: Internal Medicine

## 2015-03-14 ENCOUNTER — Encounter: Payer: Self-pay | Admitting: Internal Medicine

## 2015-03-14 ENCOUNTER — Ambulatory Visit (INDEPENDENT_AMBULATORY_CARE_PROVIDER_SITE_OTHER): Payer: 59 | Admitting: Internal Medicine

## 2015-03-14 VITALS — BP 122/76 | HR 76 | Ht 73.0 in | Wt 398.4 lb

## 2015-03-14 DIAGNOSIS — Z23 Encounter for immunization: Secondary | ICD-10-CM

## 2015-03-14 DIAGNOSIS — G4733 Obstructive sleep apnea (adult) (pediatric): Secondary | ICD-10-CM | POA: Diagnosis not present

## 2015-03-14 NOTE — Assessment & Plan Note (Signed)
Compliance and control are good by his description. Weight loss would help. Plan-we will work with his old DME company to get his care transferred to a local one at his request. I will permit download for compliance and pressure checks.

## 2015-03-14 NOTE — Progress Notes (Signed)
12/05/14- 39 yoM never smoker Referred by Dr. Abner Greenspan for sleep apnea.  pt had sleep study in 2006.  Pt currently uses cpap daily, needs new supplies.  pt unsure of dme Jeffrie Lofstrom.   NPSG 2006 AHI 72, weight 332 lbs 14 cwp   Now up to 411 lbs He had lost contact with his DME company but says he loves his CPAP and uses it all night every night. Mask is worn out. Wife does not tell him he snores when he wears it and he feels rested. ENT-none;   no cardiopulmonary disease  03/14/15- 39 yoM never smoker Referred by Dr. Abner Greenspan for sleep apnea.  pt had sleep study in 2006.  Pt currently uses cpap daily, needs new supplies.  pt unsure of dme Ola Fawver.  FOLLOWS FOR: pt. wears CPAP every night 6-7 hours. pressure is good. no DL. pt. has had issues with changing DME per last order. He needs additional help her get changed from his previous DME company to a local one which can provide replacement straps and supplies. He continues to use CPAP all night every night and feels dependent upon it for good sleep. He indicates he is making some effort to lose weight.   ROS-see HPI   Negative unless "+" Constitutional:    weight loss, night sweats, fevers, chills, fatigue, lassitude. HEENT:    headaches, difficulty swallowing, tooth/dental problems, sore throat,       sneezing, itching, ear ache, +nasal congestion, post nasal drip, snoring CV:    chest pain, orthopnea, PND, swelling in lower extremities, anasarca,                                                       dizziness, palpitations Resp:   shortness of breath with exertion or at rest.                productive cough,   non-productive cough, coughing up of blood.              change in color of mucus.  wheezing.   Skin:    rash or lesions. GI:  No-   Heartburn,+ indigestion,  GU: dpain. MS:   joint pain, stiffness, decreased range of motion, back pain. Neuro-     nothing unusual Psych:  change in mood or affect.  depression or anxiety.   memory loss.  OBJ-  Physical Exam General- Alert, Oriented, Affect-appropriate, Distress- none acute,                 +morbid obesity Skin- rash-none, lesions- none, excoriation- none Lymphadenopathy- none Head- atraumatic            Eyes- Gross vision intact, PERRLA, conjunctivae and secretions clear            Ears- Hearing, canals-normal            Nose- Clear, no-Septal dev, mucus, polyps, erosion, perforation             Throat- Mallampati IV , mucosa clear , drainage- none, tonsils- atrophic Neck- flexible , trachea midline, no stridor , thyroid nl, carotid no bruit Chest - symmetrical excursion , unlabored           Heart/CV- RRR , no murmur , no gallop  , no rub, nl s1 s2                           -  JVD- none , edema- none, stasis changes- none, varices- none           Lung- clear to P&A, wheeze- none, cough- none , dullness-none, rub- none           Chest wall-  Abd-  Br/ Gen/ Rectal- Not done, not indicated Extrem- cyanosis- none, clubbing, none, atrophy- none, strength- nl, left foot in boot after stress fracture Neuro- grossly intact to observation

## 2015-03-14 NOTE — Patient Instructions (Signed)
Order- Contact [facility] in [place], [place]- send them order to [place] Bobby [name] as CPAP patient  Order- new DME, continue CPAP at present settings. Mask of choice, supplies, humidifier, AirView,   Dx OSA  Flu vax

## 2015-03-14 NOTE — Assessment & Plan Note (Signed)
I'm not sure about his motivation to lose weight at this time. Encouragement provided.

## 2015-04-21 ENCOUNTER — Ambulatory Visit (INDEPENDENT_AMBULATORY_CARE_PROVIDER_SITE_OTHER): Payer: 59 | Admitting: Family Medicine

## 2015-04-21 ENCOUNTER — Encounter: Payer: Self-pay | Admitting: Family Medicine

## 2015-04-21 VITALS — BP 150/87 | HR 90 | Temp 97.9°F | Resp 20 | Wt >= 6400 oz

## 2015-04-21 DIAGNOSIS — R8290 Unspecified abnormal findings in urine: Secondary | ICD-10-CM

## 2015-04-21 DIAGNOSIS — R300 Dysuria: Secondary | ICD-10-CM | POA: Insufficient documentation

## 2015-04-21 DIAGNOSIS — R350 Frequency of micturition: Secondary | ICD-10-CM | POA: Diagnosis not present

## 2015-04-21 LAB — POCT URINALYSIS DIPSTICK
GLUCOSE UA: NEGATIVE
NITRITE UA: NEGATIVE
PROTEIN UA: 30
RBC UA: NEGATIVE
SPEC GRAV UA: 1.02
UROBILINOGEN UA: 2
pH, UA: 7

## 2015-04-21 LAB — CBC WITH DIFFERENTIAL/PLATELET
BASOS PCT: 0.5 % (ref 0.0–3.0)
Basophils Absolute: 0 10*3/uL (ref 0.0–0.1)
EOS ABS: 0.1 10*3/uL (ref 0.0–0.7)
EOS PCT: 0.7 % (ref 0.0–5.0)
HCT: 39.4 % (ref 39.0–52.0)
Hemoglobin: 12.8 g/dL — ABNORMAL LOW (ref 13.0–17.0)
LYMPHS ABS: 1.8 10*3/uL (ref 0.7–4.0)
Lymphocytes Relative: 20.3 % (ref 12.0–46.0)
MCHC: 32.5 g/dL (ref 30.0–36.0)
MCV: 84.2 fl (ref 78.0–100.0)
MONO ABS: 0.5 10*3/uL (ref 0.1–1.0)
Monocytes Relative: 5.8 % (ref 3.0–12.0)
NEUTROS ABS: 6.4 10*3/uL (ref 1.4–7.7)
NEUTROS PCT: 72.7 % (ref 43.0–77.0)
Platelets: 340 10*3/uL (ref 150.0–400.0)
RBC: 4.68 Mil/uL (ref 4.22–5.81)
RDW: 14.6 % (ref 11.5–15.5)
WBC: 8.8 10*3/uL (ref 4.0–10.5)

## 2015-04-21 LAB — URINALYSIS, ROUTINE W REFLEX MICROSCOPIC
Bilirubin Urine: NEGATIVE
Hgb urine dipstick: NEGATIVE
KETONES UR: NEGATIVE
Leukocytes, UA: NEGATIVE
Nitrite: NEGATIVE
RBC / HPF: NONE SEEN (ref 0–?)
SPECIFIC GRAVITY, URINE: 1.02 (ref 1.000–1.030)
TOTAL PROTEIN, URINE-UPE24: NEGATIVE
URINE GLUCOSE: NEGATIVE
UROBILINOGEN UA: 1 (ref 0.0–1.0)
pH: 7 (ref 5.0–8.0)

## 2015-04-21 LAB — BASIC METABOLIC PANEL
BUN: 14 mg/dL (ref 6–23)
CALCIUM: 9.4 mg/dL (ref 8.4–10.5)
CO2: 27 meq/L (ref 19–32)
CREATININE: 1.01 mg/dL (ref 0.40–1.50)
Chloride: 102 mEq/L (ref 96–112)
GFR: 87.14 mL/min (ref >60.00)
GLUCOSE: 99 mg/dL (ref 70–99)
Potassium: 4.4 mEq/L (ref 3.5–5.1)
Sodium: 139 mEq/L (ref 135–145)

## 2015-04-21 MED ORDER — CIPROFLOXACIN HCL 250 MG PO TABS: 250.0000 mg | ORAL_TABLET | Freq: Two times a day (BID) | ORAL | Status: DC

## 2015-04-21 NOTE — Patient Instructions (Addendum)
Urinary Frequency The number of times a normal person urinates depends upon how much liquid they take in and how much liquid they are losing. If the temperature is hot and there is high humidity, then the person will sweat more and usually breathe a little more frequently. These factors decrease the amount of frequency of urination that would be considered normal. The amount you drink is easily determined, but the amount of fluid lost is sometimes more difficult to calculate.  Fluid is lost in two ways:  Sensible fluid loss is usually measured by the amount of urine that you get rid of. Losses of fluid can also occur with diarrhea.  Insensible fluid loss is more difficult to measure. It is caused by evaporation. Insensible loss of fluid occurs through breathing and sweating. It usually ranges from a little less than a quart to a little more than a quart of fluid a day. In normal temperatures and activity levels, the average person may urinate 4 to 7 times in a 24-hour period. Needing to urinate more often than that could indicate a problem. If one urinates 4 to 7 times in 24 hours and has large volumes each time, that could indicate a different problem from one who urinates 4 to 7 times a day and has small volumes. The time of urinating is also important. Most urinating should be done during the waking hours. Getting up at night to urinate frequently can indicate some problems. CAUSES  The bladder is the organ in your lower abdomen that holds urine. Like a balloon, it swells some as it fills up. Your nerves sense this and tell you it is time to head for the bathroom. There are a number of reasons that you might feel the need to urinate more often than usual. They include:  Urinary tract infection. This is usually associated with other signs such as burning when you urinate.  In men, problems with the prostate (a walnut-size gland that is located near the tube that carries urine out of your body). There  are two reasons why the prostate can cause an increased frequency of urination:  An enlarged prostate that does not let the bladder empty well. If the bladder only half empties when you urinate, then it only has half the capacity to fill before you have to urinate again.  The nerves in the bladder become more hypersensitive with an increased size of the prostate even if the bladder empties completely.  Pregnancy.  Obesity. Excess weight is more likely to cause a problem for women than for men.  Bladder stones or other bladder problems.  Caffeine.  Alcohol.  Medications. For example, drugs that help the body get rid of extra fluid (diuretics) increase urine production. Some other medicines must be taken with lots of fluids.  Muscle or nerve weakness. This might be the result of a spinal cord injury, a stroke, multiple sclerosis, or Parkinson disease.  Long-standing diabetes can decrease the sensation of the bladder. This loss of sensation makes it harder to sense the bladder needs to be emptied. Over a period of years, the bladder is stretched out by constant overfilling. This weakens the bladder muscles so that the bladder does not empty well and has less capacity to fill with new urine.  Interstitial cystitis (also called painful bladder syndrome). This condition develops because the tissues that line the inside of the bladder are inflamed (inflammation is the body's way of reacting to injury or infection). It causes pain and frequent   urination. It occurs in women more often than in men. °DIAGNOSIS  °· To decide what might be causing your urinary frequency, your health care provider will probably: °¨ Ask about symptoms you have noticed. °¨ Ask about your overall health. This will include questions about any medications you are taking. °¨ Do a physical examination. °· Order some tests. These might include: °¨ A blood test to check for diabetes or other health issues that could be contributing  to the problem. °¨ Urine testing. This could measure the flow of urine and the pressure on the bladder. °¨ A test of your neurological system (the brain, spinal cord, and nerves). This is the system that senses the need to urinate. °¨ A bladder test to check whether it is emptying completely when you urinate. °¨ Cystoscopy. This test uses a thin tube with a tiny camera on it. It offers a look inside your urethra and bladder to see if there are problems. °¨ Imaging tests. You might be given a contrast dye and then asked to urinate. X-rays are taken to see how your bladder is working. °TREATMENT  °It is important for you to be evaluated to determine if the amount or frequency that you have is unusual or abnormal. If it is found to be abnormal, the cause should be determined and this can usually be found out easily. Depending upon the cause, treatment could include medication, stimulation of the nerves, or surgery. °There are not too many things that you can do as an individual to change your urinary frequency. It is important that you balance the amount of fluid intake needed to compensate for your activity and the temperature. Medical problems will be diagnosed and taken care of by your physician. There is no particular bladder training such as Kegel exercises that you can do to help urinary frequency. This is an exercise that is usually recommended for people who have leaking of urine when they laugh, cough, or sneeze. °HOME CARE INSTRUCTIONS  °· Take any medications your health care provider prescribed or suggested. Follow the directions carefully. °· Practice any lifestyle changes that are recommended. These might include: °¨ Drinking less fluid or drinking at different times of the day. If you need to urinate often during the night, for example, you may need to stop drinking fluids early in the evening. °¨ Cutting down on caffeine or alcohol. They both can make you need to urinate more often than normal. Caffeine  is found in coffee, tea, and sodas. °¨ Losing weight, if that is recommended. °· Keep a journal or a log. You might be asked to record how much you drink and when and where you feel the need to urinate. This will also help evaluate how well the treatment provided by your physician is working. °SEEK MEDICAL CARE IF:  °· Your need to urinate often gets worse. °· You feel increased pain or irritation when you urinate. °· You notice blood in your urine. °· You have questions about any medications that your health care provider recommended. °· You notice blood, pus, or swelling at the site of any test or treatment procedure. °· You develop a fever of more than 100.5°F (38.1°C). °SEEK IMMEDIATE MEDICAL CARE IF:  °You develop a fever of more than 102.0°F (38.9°C). °  °This information is not intended to replace advice given to you by your health care provider. Make sure you discuss any questions you have with your health care provider. °  °Document Released: 03/27/2009 Document Revised:   06/21/2014 Document Reviewed: 03/27/2009 Elsevier Interactive Patient Education [employer]2016 [employer] Inc[employer]   I will call in an antibiotic to cover urinary problems I want you to call to your attention specialist and see about strattera causing your symptoms.  If symptoms worsen we made need to send you to urology or [facility].

## 2015-04-21 NOTE — Progress Notes (Signed)
Subjective:    Patient ID: Bobby [name], male    DOB: 08/19/1975, 39 y.o.   MRN: [id number]  HPI  Dysuria: Patient presents for acute office visit secondary to urinary frequency, and pressure like discomfort with urination. Patient states approximately 2 weeks ago he noticed a one-time quick shooting pain with urination. He thought maybe he had passed a small kidney stone. He has had history of kidney stone when he was in his early 5620s. He reports not thinking much more of it but he has noticed increasing pressure feeling over his bladder, urinary frequency and sometimes urinary urgency. Patient states it's not as bad if he is sitting. Yesterday he noted change in his urinary stream, and which she stated it sprayed. Patient states he has had urethral meatal surgery as a child for similar symptoms. Patient is married, in a monogamous relationship. He denies any penile discharge or irritation. Patient denies any new onset low back pain, fevers, chills, nausea or vomiting. Patient is eating and drinking well. He has started a new medication approximately 2 weeks ago. He started Strattera at that time is being tapered up on his dose.    Past Medical History  Diagnosis Date  . GERD (gastroesophageal reflux disease)   . Obesity     morbid  . Hyperlipidemia   . Hypertension   . Thyroid disease     Hypothyroid  . Tremor     idiopathic  . Lumbar back pain     with radiculopathy  . Hypothyroid 12/28/2010  . Fatigue 04/08/2011  . Paresthesia of skin 04/12/2011  . Vitamin B 12 deficiency 04/13/2011  . History of hypotestosteronemia 04/13/2011  . Paronychia 10/13/2011  . HTN (hypertension) 07/08/2010    Qualifier: Diagnosis of  By: Abner GreenspanBlyth MD, Misty StanleyStacey    . Anemia   . OSA (obstructive sleep apnea)     cpap setting of 14  . ADD (attention deficit disorder with hyperactivity)     adult  . Depression with anxiety 04/12/2011  . 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 Inclusion cyst 07/08/2010    Qualifier:  Diagnosis of  By: Abner GreenspanBlyth MD, Stacey     Allergies  Allergen Reactions  . Adhesive [Tape] Itching and Rash   Social History   Social History  . Marital Status: Married    Spouse Name: N/A  . Number of Children: N/A  . Years of Education: N/A   Occupational History  . Not on file.   Social History Main Topics  . Smoking status: Never Smoker   . Smokeless tobacco: Never Used  . Alcohol Use: No     Comment: Rare - 1-2x/year  . Drug Use: No  . Sexual Activity: Not on file   Other Topics Concern  . Not on file   Social History Narrative    Review of Systems Negative, with the exception of above mentioned in HPI    Objective:   Physical Exam There were no vitals taken for this visit. Gen: Afebrile.   No acute distress, nontoxic in appearance, well-developed, well-nourished, obese Caucasian male. Pleasant.  HENT: AT. [place].  MMM.  CV: RRR  Abd: Soft.Morbidly obese. NTND, pressure over bladder with palpation.  MSK: No CVA tenderness bilaterally GU: Smooth, nonenlarged prostate. Negative chandelier sign.     Assessment & Plan:  Dysuria/frequency/abnormal urine - CBC, BMP, UA with reflex and culture - POCT UA: small bili, trace ketone, 30 protein, neg blood, neg nitrite, trace leuk - Exam not consistent with prostatitis and  obesity makes abd exam difficult.  - ? Urinary retention and sx from Strattera taper. Symptoms started when med started - Will treat with 3 day course of cipro prophylactic. Pt encouraged to call attention specialist about tapering off Strattera if all labs normal and symptoms remain despite abx. - Doubt kidney stone without blood in the urine, although may need imaging study if symptoms remain.   - ciprofloxacin (CIPRO) 250 MG tablet; Take 1 tablet (250 mg total) by mouth 2 (two) times daily.  Dispense: 6 tablet; Refill: 0

## 2015-04-22 LAB — URINE CULTURE
COLONY COUNT: NO GROWTH
ORGANISM ID, BACTERIA: NO GROWTH

## 2015-04-23 ENCOUNTER — Telehealth: Payer: Self-pay | Admitting: Family Medicine

## 2015-04-23 NOTE — Telephone Encounter (Signed)
Left message with patient lab results and instructions on patient voice mail 

## 2015-04-23 NOTE — Telephone Encounter (Signed)
Please call pt: - his lab work is normal, his urine results are normal.  - F/u needed if still experiencing symptoms after antibiotic finished, Strattera tapered off or symptoms are worsening.

## 2015-05-22 ENCOUNTER — Other Ambulatory Visit: Payer: Self-pay | Admitting: Family Medicine

## 2015-08-28 ENCOUNTER — Other Ambulatory Visit: Payer: Self-pay | Admitting: Family Medicine

## 2015-09-15 ENCOUNTER — Encounter: Payer: Self-pay | Admitting: Internal Medicine

## 2015-09-15 ENCOUNTER — Ambulatory Visit (INDEPENDENT_AMBULATORY_CARE_PROVIDER_SITE_OTHER): Payer: 59 | Admitting: Internal Medicine

## 2015-09-15 VITALS — BP 124/78 | HR 73 | Ht 73.0 in | Wt >= 6400 oz

## 2015-09-15 DIAGNOSIS — G4733 Obstructive sleep apnea (adult) (pediatric): Secondary | ICD-10-CM

## 2015-09-15 NOTE — Patient Instructions (Signed)
Order- DME [facility]- please provide pressure compliance download and add AirView if possible   Dx OSA  Please call as needed

## 2015-09-15 NOTE — Progress Notes (Signed)
12/05/14- 39 yoM never smoker Referred by Dr. Abner GreenspanBlyth for sleep apnea.  pt had sleep study in 2006.  Pt currently uses cpap daily, needs new supplies.  pt unsure of dme provider.   NPSG 2006 AHI 72, weight 332 lbs 14 cwp   Now up to 411 lbs He had lost contact with his DME company but says he loves his CPAP and uses it all night every night. Mask is worn out. Wife does not tell him he snores when he wears it and he feels rested. ENT-none;   no cardiopulmonary disease  03/14/15- 39 yoM never smoker Referred by Dr. Abner GreenspanBlyth for sleep apnea.  pt had sleep study in 2006.  Pt currently uses cpap daily, needs new supplies.  pt unsure of dme provider.  FOLLOWS FOR: pt. wears CPAP every night 6-7 hours. pressure is good. no DL. pt. has had issues with changing DME per last order. He needs additional help her get changed from his previous DME company to a local one which can provide replacement straps and supplies. He continues to use CPAP all night every night and feels dependent upon it for good sleep. He indicates he is making some effort to lose weight.  09/15/2015-40 year old male never smoker followed for OSA CPAP APS FOLLOWS FOR: [facility]. Pt states he wears CPAP every night for about 6-7.5  hours. No supplies needed at this time. No DL nor in AirView.      ROS-see HPI   Negative unless "+" Constitutional:    weight loss, night sweats, fevers, chills, fatigue, lassitude. HEENT:    headaches, difficulty swallowing, tooth/dental problems, sore throat,       sneezing, itching, ear ache, +nasal congestion, post nasal drip, snoring CV:    chest pain, orthopnea, PND, swelling in lower extremities, anasarca,                                                       dizziness, palpitations Resp:   shortness of breath with exertion or at rest.                productive cough,   non-productive cough, coughing up of blood.              change in color of mucus.  wheezing.   Skin:    rash or lesions. GI:  No-    Heartburn,+ indigestion,  GU: dpain. MS:   joint pain, stiffness, decreased range of motion, back pain. Neuro-     nothing unusual Psych:  change in mood or affect.  depression or anxiety.   memory loss.  OBJ- Physical Exam General- Alert, Oriented, Affect-appropriate, Distress- none acute,                 +morbid obesity Skin- rash-none, lesions- none, excoriation- none Lymphadenopathy- none Head- atraumatic            Eyes- Gross vision intact, PERRLA, conjunctivae and secretions clear            Ears- Hearing, canals-normal            Nose- Clear, no-Septal dev, mucus, polyps, erosion, perforation             Throat- Mallampati IV , mucosa clear , drainage- none, tonsils- atrophic Neck- flexible , trachea midline, no stridor , thyroid nl, carotid  no bruit Chest - symmetrical excursion , unlabored           Heart/CV- RRR , no murmur , no gallop  , no rub, nl s1 s2                           - JVD- none , edema- none, stasis changes- none, varices- none           Lung- clear to P&A, wheeze- none, cough- none , dullness-none, rub- none           Chest wall-  Abd-  Br/ Gen/ Rectal- Not done, not indicated Extrem- cyanosis- none, clubbing, none, atrophy- none, strength- nl, left foot in boot after stress fracture Neuro- grossly intact to observation

## 2015-11-26 ENCOUNTER — Ambulatory Visit (INDEPENDENT_AMBULATORY_CARE_PROVIDER_SITE_OTHER): Payer: 59 | Admitting: Family Medicine

## 2015-11-26 ENCOUNTER — Encounter: Payer: Self-pay | Admitting: Family Medicine

## 2015-11-26 VITALS — BP 153/93 | HR 70 | Temp 98.5°F | Resp 20 | Ht 73.0 in | Wt >= 6400 oz

## 2015-11-26 DIAGNOSIS — Z7689 Persons encountering health services in other specified circumstances: Secondary | ICD-10-CM

## 2015-11-26 DIAGNOSIS — Z8639 Personal history of other endocrine, nutritional and metabolic disease: Secondary | ICD-10-CM

## 2015-11-26 DIAGNOSIS — E785 Hyperlipidemia, unspecified: Secondary | ICD-10-CM

## 2015-11-26 DIAGNOSIS — Z7189 Other specified counseling: Secondary | ICD-10-CM | POA: Diagnosis not present

## 2015-11-26 DIAGNOSIS — I10 Essential (primary) hypertension: Secondary | ICD-10-CM

## 2015-11-26 DIAGNOSIS — F418 Other specified anxiety disorders: Secondary | ICD-10-CM

## 2015-11-26 DIAGNOSIS — E538 Deficiency of other specified B group vitamins: Secondary | ICD-10-CM

## 2015-11-26 DIAGNOSIS — R5383 Other fatigue: Secondary | ICD-10-CM

## 2015-11-26 MED ORDER — CITALOPRAM HYDROBROMIDE 20 MG PO TABS: 20.0000 mg | ORAL_TABLET | Freq: Every day | ORAL | Status: DC

## 2015-11-26 NOTE — Patient Instructions (Signed)
Please schedule a fasting lab appt for 8 am within 1 week. Then have CPE appt 5 days to 1 week later. It will take this long to get all results back and be able to review.  Testosterone Testosterone is a hormone made by the male's testicles and by the adrenal glands, which are a pair of glands on top of the kidneys. Starting at puberty, testosterone stimulates the development of secondary sex characteristics. This includes a deeper voice, growth of muscles and body hair, and penis enlargement.  Females also produce testosterone in both the adrenal glands and ovaries. A male's body converts testosterone into estradiol, the main male sex hormone. An abnormal level of testosterone can cause health issues in both males and females. You may have this test if your health care provider suspects that an abnormal testosterone level is causing or contributing to other health problems. In males, symptoms of an abnormal testosterone level include:  Infertility.  Erectile dysfunction.  Delayed puberty or premature puberty. In females, symptoms of an abnormally high testosterone level include:  Infertility.  Polycystic ovarian syndrome (PCOS).  Developing masculine features (virilization). This test requires a blood sample taken from a vein in your arm or hand. The sample for this test is usually collected in the morning. The amount of testosterone in your blood is highest at that time. RESULTS It is your responsibility to obtain your test results. Ask the lab or department performing the test when and how you will get your results. Contact your health care provider to discuss any questions you have about your results.  The result of a blood test for testosterone will be given as a range of values. A testosterone level that is outside the normal range may indicate a health problem. Testosterone is measured in nanograms per deciliter (ng/dL). Range of Normal Values Ranges for normal values may vary  among different labs and hospitals. You should always check with your health care provider after having lab work or other tests done to discuss whether your values are considered within normal limits. Normal levels of total testosterone are as follows:  Male:  7 months to 40 years old: less than 30 ng/dL.  78-65 years old: less than 300 ng/dL.  48-37 years old: 170-540 ng/dL.  77-89 years old: 250-910 ng/dL.  40 years old and over: 280-1,080 ng/dL.  Male:  7 months to 40 years old: less than 30 ng/dL.  44-57 years old: less than 40 ng/dL.  4-40 years old: less than 60 ng/dL.  73-62 years old: less than 70 ng/dL.  40 years old and over: less than 70 ng/dL. Meaning of Results Outside Normal Value Ranges A testosterone level that is too low or too high can indicate a number of health problems. In males:  A high testosterone level can occur if you:  Have certain types of tumors.  Have an overactive thyroid gland (hyperthyroidism).  Use anabolic steroids.  Are starting puberty early (precocious puberty).  Have an inherited disorder that affects the adrenal glands (congenital adrenal hyperplasia).  A low testosterone level can occur if you:  Have certain genetic diseases.  Have had certain viral infections, such as mumps.  Have pituitary disease.  Have had an injury to the testicles.  Are an alcoholic. In females:  A high testosterone level can occur if you have:  Certain types of tumors.  An inherited disorder that affects certain cells in the adrenal glands (congenital adrenocortical hyperplasia).  PCOS.  A low testosterone level does  not cause health problems. Discuss the results of your testosterone test with your health care provider. Your health care provider will use the results of this test and other tests to make a diagnosis.   This information is not intended to replace advice given to you by your health care provider. Make sure you discuss any  questions you have with your health care provider.   Document Released: 06/17/2004 Document Revised: 06/21/2014 Document Reviewed: 09/26/2013 Elsevier Interactive Patient Education [employer]

## 2015-11-26 NOTE — Progress Notes (Signed)
Patient ID: Bobby [name], male   DOB: December 09, 1975, 40 y.o.   MRN: [id number]      Patient ID: Bobby [name], male  DOB: 1976-02-10, 40 y.o.   MRN: [id number]  Subjective:  Bobby [name] is a 40 y.o. male present for transfer of care.  All past medical history, surgical history, allergies, family history, immunizations, medications and social history were obtained/entered in the electronic medical record today. All recent labs, ED visits and hospitalizations within the last year were reviewed.  Patient presents for establishment of care/transfer to new doctor today. He has complaints surrounding his energy/fatigue and  mood swings. He has been seeing a an attention specialist for adhd, but they have not found a medication that gives him adequate results and his provider wanted to seek care for possible other medical issues that could be affecting his adhd. Patient reports feeling more focused on adhd medications, however his energy is an issue. He also has noticed more mood swings, being more impulsive and being short tempered. He has fatigue. He reports compliance with CPAP nightly. Patient states he has a history of b12 deficiency/pernicious anemia, low testosterone levels. He use to receive injections for both, but stopped many years ago. 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 He also has a history of hypertension and is not on medications.    Past Medical History  Diagnosis Date  . GERD (gastroesophageal reflux disease)   . Obesity     morbid  . Hyperlipidemia   . Hypertension   . Thyroid disease     Hypothyroid  . Tremor     idiopathic  . Lumbar back pain     with radiculopathy  . Hypothyroid 12/28/2010  . Fatigue 04/08/2011  . Paresthesia of skin 04/12/2011  . Vitamin B 12 deficiency 04/13/2011  . History of hypotestosteronemia 04/13/2011  . Paronychia 10/13/2011  .  HTN (hypertension) 07/08/2010    Qualifier: Diagnosis of  By: Abner Greenspan MD, Misty Stanley    . Anemia   . OSA (obstructive sleep apnea)     cpap setting of 14  . ADD (attention deficit disorder with hyperactivity)     adult  . Depression with anxiety 04/12/2011  . 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 Inclusion cyst 07/08/2010    Qualifier: Diagnosis of  By: Abner Greenspan MD, Stacey     Allergies  Allergen Reactions  . Adhesive [Tape] Itching and Rash   Past Surgical History  Procedure Laterality Date  . Wisdom teeth exactraction  age 61  . Meatal surgery to urinary opening  age 60  . Cystectomy  10/2011    neck   Family History  Problem Relation Age of Onset  . Hypertension Mother   . Obesity Mother   . Cardiomyopathy Mother   . Arthritis Father   . Gout Father   . Hyperlipidemia Father   . Hypertension Father   . Cleft palate Father   . Other Father     disassociative fugue  . Obesity Father   . ADD / ADHD Daughter     ADHD  . Coronary artery disease Maternal Grandmother     s/p multiple MI's first one in late 20's  . Other Maternal Grandmother     CHF  . Cancer Maternal Grandmother     breast  . Other Maternal Grandfather     Essential tremors  .  Cancer Maternal Grandfather     spinal/ smoker/brain  . Leukemia Paternal Grandmother   . Cancer Paternal Grandmother     leukemia  . Atrial fibrillation Paternal Grandfather [id number]  . Hypertension Paternal Grandfather   . Other Paternal Doctor, general practiceGrandfather     pacer/defibrillator  . Cancer Maternal Uncle     colon   Social History   Social History  . Marital Status: Married    Spouse Name: N/A  . Number of Children: N/A  . Years of Education: N/A   Occupational History  . Not on file.   Social History Main Topics  . Smoking status: Never Smoker   . Smokeless tobacco: Never Used  . Alcohol Use: No     Comment: Rare - 1-2x/year  . Drug Use: No  . Sexual Activity: Not on file   Other Topics Concern  . Not on file   Social History  Narrative     ROS: Negative, with the exception of above mentioned in HPI  Objective: BP 153/93 mmHg  Pulse 70  Temp(Src) 98.5 F (36.9 C)  Resp 20  Ht 6\' 1"  (1.854 m)  Wt 433 lb 4 oz (196.521 kg)  BMI 57.17 kg/m2  SpO2 97% Gen: Afebrile. No acute distress. Nontoxic in appearance, well-developed, well-nourished, male, morbidly obese, caucasian, very pleasant HENT: AT. Bobby [name].  MMM, no oral lesions Eyes:Pupils Equal Round Reactive to light, Extraocular movements intact,  Conjunctiva without redness, discharge or icterus. Neck/lymp/endocrine: Supple, no lymphadenopathy, no thyromegaly CV: RRR, 1/6 SM, trace edema Chest: CTAB, no wheeze, rhonchi or crackles.  Abd: Soft. Morbidly obese. NTND.  Skin:  Warm and well-perfused. Skin intact. Neuro/Msk: Normal gait. PERLA. EOMi. Alert. Oriented x3.   Psych: Normal affect, dress and demeanor. Normal speech. Normal thought content and judgment.   Assessment/plan: Bobby NoseWilliam P [name] is a 40 y.o. male present for transfer of care (within [facility]) and multiple complaints: Other fatigue Vitamin B 12 deficiency Morbid obesity, unspecified obesity type (HCC) Hyperlipidemia Essential hypertension History of hypotestosteronemia Depression with anxiety - discussed with patient his fatigue and mood disturbances could be multifactorial. Many of his known conditions can cause fatigue, along with not being treated for b12 deficiency/pernicious anemia, endocrine disorders, uncontrolled hypertension etc.  - Would need to evaluate with blood tests. Will order future labs for patient to complete fasting between 8-10 am, and follow up with CPE about 1 week after to review all results. Pt was agreeable with plan. Discussed with him he needs to maintain a low salt diet, increase exercise > 150 minutes in the meantime. If his BP is elevated above 140/90 on CPE will need to consider medication.    Greater than 40 minutes was spent with patient, greater than 50% of  that time was spent face-to-face with patient counseling and coordinating care.  Return in about 2 weeks (around 12/10/2015) for CPE.  Electronically signed by: Felix Pacinienee Kuneff, DO [facility]

## 2015-11-27 ENCOUNTER — Other Ambulatory Visit (INDEPENDENT_AMBULATORY_CARE_PROVIDER_SITE_OTHER): Payer: 59

## 2015-11-27 DIAGNOSIS — E538 Deficiency of other specified B group vitamins: Secondary | ICD-10-CM

## 2015-11-27 DIAGNOSIS — I10 Essential (primary) hypertension: Secondary | ICD-10-CM

## 2015-11-27 DIAGNOSIS — R5383 Other fatigue: Secondary | ICD-10-CM | POA: Diagnosis not present

## 2015-11-27 DIAGNOSIS — Z8639 Personal history of other endocrine, nutritional and metabolic disease: Secondary | ICD-10-CM

## 2015-11-27 DIAGNOSIS — E785 Hyperlipidemia, unspecified: Secondary | ICD-10-CM

## 2015-11-27 DIAGNOSIS — F418 Other specified anxiety disorders: Secondary | ICD-10-CM

## 2015-11-27 DIAGNOSIS — Z7689 Persons encountering health services in other specified circumstances: Secondary | ICD-10-CM

## 2015-11-27 LAB — CBC WITH DIFFERENTIAL/PLATELET
BASOS PCT: 1 %
Basophils Absolute: 70 cells/uL (ref 0–200)
EOS ABS: 140 {cells}/uL (ref 15–500)
Eosinophils Relative: 2 %
HCT: 38.8 % (ref 38.5–50.0)
Hemoglobin: 12.6 g/dL — ABNORMAL LOW (ref 13.2–17.1)
LYMPHS PCT: 30 %
Lymphs Abs: 2100 cells/uL (ref 850–3900)
MCH: 27.2 pg (ref 27.0–33.0)
MCHC: 32.5 g/dL (ref 32.0–36.0)
MCV: 83.6 fL (ref 80.0–100.0)
MONOS PCT: 7 %
MPV: 9.7 fL (ref 7.5–12.5)
Monocytes Absolute: 490 cells/uL (ref 200–950)
NEUTROS PCT: 60 %
Neutro Abs: 4200 cells/uL (ref 1500–7800)
PLATELETS: 327 10*3/uL (ref 140–400)
RBC: 4.64 MIL/uL (ref 4.20–5.80)
RDW: 15.1 % — AB (ref 11.0–15.0)
WBC: 7 10*3/uL (ref 3.8–10.8)

## 2015-11-28 LAB — COMPREHENSIVE METABOLIC PANEL
ALK PHOS: 59 U/L (ref 40–115)
ALT: 16 U/L (ref 9–46)
AST: 15 U/L (ref 10–40)
Albumin: 3.8 g/dL (ref 3.6–5.1)
BILIRUBIN TOTAL: 0.5 mg/dL (ref 0.2–1.2)
BUN: 13 mg/dL (ref 7–25)
CALCIUM: 9 mg/dL (ref 8.6–10.3)
CO2: 22 mmol/L (ref 20–31)
CREATININE: 0.98 mg/dL (ref 0.60–1.35)
Chloride: 105 mmol/L (ref 98–110)
GLUCOSE: 90 mg/dL (ref 65–99)
Potassium: 4.5 mmol/L (ref 3.5–5.3)
SODIUM: 138 mmol/L (ref 135–146)
Total Protein: 6.9 g/dL (ref 6.1–8.1)

## 2015-11-28 LAB — LIPID PANEL
CHOLESTEROL: 219 mg/dL — AB (ref 125–200)
HDL: 35 mg/dL — ABNORMAL LOW (ref >=40)
LDL CALC: 139 mg/dL — AB (ref <130)
Total CHOL/HDL Ratio: 6.3 Ratio — ABNORMAL HIGH (ref <=5.0)
Triglycerides: 224 mg/dL — ABNORMAL HIGH (ref <150)
VLDL: 45 mg/dL — AB (ref <30)

## 2015-11-28 LAB — TSH: TSH: 3.75 mIU/L (ref 0.40–4.50)

## 2015-11-28 LAB — TESTOSTERONE TOTAL,FREE,BIO, MALES
ALBUMIN: 3.8 g/dL (ref 3.6–5.1)
Sex Hormone Binding: 14 nmol/L (ref 10–50)
TESTOSTERONE BIOAVAILABLE: 64.1 ng/dL — AB (ref 130.5–681.7)
TESTOSTERONE FREE: 36.6 pg/mL — AB (ref 47.0–244.0)
TESTOSTERONE: 155 ng/dL — AB (ref 250–827)

## 2015-11-28 LAB — VITAMIN D 25 HYDROXY (VIT D DEFICIENCY, FRACTURES): Vit D, 25-Hydroxy: 16 ng/mL — ABNORMAL LOW (ref 30–100)

## 2015-11-28 LAB — VITAMIN B12: Vitamin B-12: 378 pg/mL (ref 200–1100)

## 2015-11-28 LAB — HEMOGLOBIN A1C
Hgb A1c MFr Bld: 5.8 % — ABNORMAL HIGH (ref <5.7)
Mean Plasma Glucose: 120 mg/dL

## 2015-11-28 LAB — PSA: PSA: 2.7 ng/mL (ref <=4.00)

## 2015-12-01 ENCOUNTER — Ambulatory Visit (INDEPENDENT_AMBULATORY_CARE_PROVIDER_SITE_OTHER): Payer: 59 | Admitting: Family Medicine

## 2015-12-01 ENCOUNTER — Encounter: Payer: Self-pay | Admitting: Family Medicine

## 2015-12-01 VITALS — BP 157/93 | HR 66 | Temp 98.0°F | Resp 20 | Ht 73.0 in | Wt >= 6400 oz

## 2015-12-01 DIAGNOSIS — E230 Hypopituitarism: Secondary | ICD-10-CM | POA: Diagnosis not present

## 2015-12-01 DIAGNOSIS — E538 Deficiency of other specified B group vitamins: Secondary | ICD-10-CM

## 2015-12-01 DIAGNOSIS — E559 Vitamin D deficiency, unspecified: Secondary | ICD-10-CM

## 2015-12-01 DIAGNOSIS — I10 Essential (primary) hypertension: Secondary | ICD-10-CM

## 2015-12-01 DIAGNOSIS — Z0001 Encounter for general adult medical examination with abnormal findings: Secondary | ICD-10-CM | POA: Diagnosis not present

## 2015-12-01 DIAGNOSIS — R5383 Other fatigue: Secondary | ICD-10-CM

## 2015-12-01 DIAGNOSIS — F418 Other specified anxiety disorders: Secondary | ICD-10-CM | POA: Diagnosis not present

## 2015-12-01 DIAGNOSIS — Z6841 Body Mass Index (BMI) 40.0 and over, adult: Secondary | ICD-10-CM

## 2015-12-01 HISTORY — DX: Hypopituitarism: E23.0

## 2015-12-01 HISTORY — DX: Vitamin D deficiency, unspecified: E55.9

## 2015-12-01 MED ORDER — CITALOPRAM HYDROBROMIDE 40 MG PO TABS: 40.0000 mg | ORAL_TABLET | Freq: Every day | ORAL | Status: DC

## 2015-12-01 MED ORDER — VITAMIN D (ERGOCALCIFEROL) 1.25 MG (50000 UNIT) PO CAPS: ORAL_CAPSULE | ORAL | Status: DC

## 2015-12-01 MED ORDER — TESTOSTERONE CYPIONATE 200 MG/ML IM SOLN: 200.0000 mg | INTRAMUSCULAR | Status: DC

## 2015-12-01 NOTE — Patient Instructions (Signed)
Increase Celexa to 40 mg daily. I have called in a new script.  I will place the order for testosterone and B12 injections to start. This will take a little bit to get organized through insurance.  I have called in Vit D supplement. This will be taken 2x a week for 10 weeks. Then will need to retest.  Start 1000 mg of fish oil supplement daily.  Exercise > 150 minutes, low salt. More fiber.     Food Choices to Lower Your Triglycerides Triglycerides are a type of fat in your blood. High levels of triglycerides can increase the risk of heart disease and stroke. If your triglyceride levels are high, the foods you eat and your eating habits are very important. Choosing the right foods can help lower your triglycerides.  WHAT GENERAL GUIDELINES DO I NEED TO FOLLOW?  Lose weight if you are overweight.   Limit or avoid alcohol.   Fill one half of your plate with vegetables and green salads.   Limit fruit to two servings a day. Choose fruit instead of juice.   Make one fourth of your plate whole grains. Look for the word "whole" as the first word in the ingredient list.  Fill one fourth of your plate with lean protein foods.  Enjoy fatty fish (such as salmon, mackerel, sardines, and tuna) three times a week.   Choose healthy fats.   Limit foods high in starch and sugar.  Eat more home-cooked food and less restaurant, buffet, and fast food.  Limit fried foods.  Cook foods using methods other than frying.  Limit saturated fats.  Check ingredient lists to avoid foods with partially hydrogenated oils (trans fats) in them. WHAT FOODS CAN I EAT?  Grains Whole grains, such as whole wheat or whole grain breads, crackers, cereals, and pasta. Unsweetened oatmeal, bulgur, barley, quinoa, or Dorantes rice. Corn or whole wheat flour tortillas.  Vegetables Fresh or frozen vegetables (raw, steamed, roasted, or grilled). Green salads. Fruits All fresh, canned (in natural juice), or frozen  fruits. Meat and Other Protein Products Ground beef (85% or leaner), grass-fed beef, or beef trimmed of fat. Skinless chicken or [place]. Ground chicken or [place]. Pork trimmed of fat. All fish and seafood. Eggs. Dried beans, peas, or lentils. Unsalted nuts or seeds. Unsalted canned or dry beans. Dairy Low-fat dairy products, such as skim or 1% milk, 2% or reduced-fat cheeses, low-fat ricotta or cottage cheese, or plain low-fat yogurt. Fats and Oils Tub margarines without trans fats. Light or reduced-fat mayonnaise and salad dressings. Avocado. Safflower, olive, or canola oils. Natural peanut or almond butter. The items listed above may not be a complete list of recommended foods or beverages. Contact your dietitian for more options. WHAT FOODS ARE NOT RECOMMENDED?  Grains White bread. White pasta. White rice. Cornbread. Bagels, pastries, and croissants. Crackers that contain trans fat. Vegetables White potatoes. Corn. Creamed or fried vegetables. Vegetables in a cheese sauce. Fruits Dried fruits. Canned fruit in light or heavy syrup. Fruit juice. Meat and Other Protein Products Fatty cuts of meat. Ribs, chicken wings, bacon, sausage, bologna, salami, chitterlings, fatback, hot dogs, bratwurst, and packaged luncheon meats. Dairy Whole or 2% milk, cream, half-and-half, and cream cheese. Whole-fat or sweetened yogurt. Full-fat cheeses. Nondairy creamers and whipped toppings. Processed cheese, cheese spreads, or cheese curds. Sweets and Desserts Corn syrup, sugars, honey, and molasses. Candy. Jam and jelly. Syrup. Sweetened cereals. Cookies, pies, cakes, donuts, muffins, and ice cream. Fats and Oils Butter, stick margarine, lard,  shortening, ghee, or bacon fat. Coconut, palm kernel, or palm oils. Beverages Alcohol. Sweetened drinks (such as sodas, lemonade, and fruit drinks or punches). The items listed above may not be a complete list of foods and beverages to avoid. Contact your dietitian for  more information.   This information is not intended to replace advice given to you by your health care provider. Make sure you discuss any questions you have with your health care provider.   Document Released: 03/18/2004 Document Revised: 06/21/2014 Document Reviewed: 04/04/2013 Elsevier Interactive Patient Education 2016 [employer].  Health Maintenance, Male A healthy lifestyle and preventative care can promote health and wellness.  Maintain regular health, dental, and eye exams.  Eat a healthy diet. Foods like vegetables, fruits, whole grains, low-fat dairy products, and lean protein foods contain the nutrients you need and are low in calories. Decrease your intake of foods high in solid fats, added sugars, and salt. Get information about a proper diet from your health care provider, if necessary.  Regular physical exercise is one of the most important things you can do for your health. Most adults should get at least 150 minutes of moderate-intensity exercise (any activity that increases your heart rate and causes you to sweat) each week. In addition, most adults need muscle-strengthening exercises on 2 or more days a week.   Maintain a healthy weight. The body mass index (BMI) is a screening tool to identify possible weight problems. It provides an estimate of body fat based on height and weight. Your health care provider can find your BMI and can help you achieve or maintain a healthy weight. For males 20 years and older:  A BMI below 18.5 is considered underweight.  A BMI of 18.5 to 24.9 is normal.  A BMI of 25 to 29.9 is considered overweight.  A BMI of 30 and above is considered obese.  Maintain normal blood lipids and cholesterol by exercising and minimizing your intake of saturated fat. Eat a balanced diet with plenty of fruits and vegetables. Blood tests for lipids and cholesterol should begin at age 50 and be repeated every 5 years. If your lipid or cholesterol levels are  high, you are over age 37, or you are at high risk for heart disease, you may need your cholesterol levels checked more frequently.Ongoing high lipid and cholesterol levels should be treated with medicines if diet and exercise are not working.  If you smoke, find out from your health care provider how to quit. If you do not use tobacco, do not start.  Lung cancer screening is recommended for adults aged 55-80 years who are at high risk for developing lung cancer because of a history of smoking. A yearly low-dose CT scan of the lungs is recommended for people who have at least a 30-pack-year history of smoking and are current smokers or have quit within the past 15 years. A pack year of smoking is smoking an average of 1 pack of cigarettes a day for 1 year (for example, a 30-pack-year history of smoking could mean smoking 1 pack a day for 30 years or 2 packs a day for 15 years). Yearly screening should continue until the smoker has stopped smoking for at least 15 years. Yearly screening should be stopped for people who develop a health problem that would prevent them from having lung cancer treatment.  If you choose to drink alcohol, do not have more than 2 drinks per day. One drink is considered to be 12 oz (  360 mL) of beer, 5 oz (150 mL) of wine, or 1.5 oz (45 mL) of liquor.  Avoid the use of street drugs. Do not share needles with anyone. Ask for help if you need support or instructions about stopping the use of drugs.  High blood pressure causes heart disease and increases the risk of stroke. High blood pressure is more likely to develop in:  People who have blood pressure in the end of the normal range (100-139/85-89 mm Hg).  People who are overweight or obese.  People who are African American.  If you are 36-16 years of age, have your blood pressure checked every 3-5 years. If you are 58 years of age or older, have your blood pressure checked every year. You should have your blood pressure  measured twice--once when you are at a hospital or clinic, and once when you are not at a hospital or clinic. Record the average of the two measurements. To check your blood pressure when you are not at a hospital or clinic, you can use:  An automated blood pressure machine at a pharmacy.  A home blood pressure monitor.  If you are 83-68 years old, ask your health care provider if you should take aspirin to prevent heart disease.  Diabetes screening involves taking a blood sample to check your fasting blood sugar level. This should be done once every 3 years after age 53 if you are at a normal weight and without risk factors for diabetes. Testing should be considered at a younger age or be carried out more frequently if you are overweight and have at least 1 risk factor for diabetes.  Colorectal cancer can be detected and often prevented. Most routine colorectal cancer screening begins at the age of 33 and continues through age 61. However, your health care provider may recommend screening at an earlier age if you have risk factors for colon cancer. On a yearly basis, your health care provider may provide home test kits to check for hidden blood in the stool. A small camera at the end of a tube may be used to directly examine the colon (sigmoidoscopy or colonoscopy) to detect the earliest forms of colorectal cancer. Talk to your health care provider about this at age 20 when routine screening begins. A direct exam of the colon should be repeated every 5-10 years through age 90, unless early forms of precancerous polyps or small growths are found.  People who are at an increased risk for hepatitis B should be screened for this virus. You are considered at high risk for hepatitis B if:  You were born in a country where hepatitis B occurs often. Talk with your health care provider about which countries are considered high risk.  Your parents were born in a high-risk country and you have not received a  shot to protect against hepatitis B (hepatitis B vaccine).  You have HIV or AIDS.  You use needles to inject street drugs.  You live with, or have sex with, someone who has hepatitis B.  You are a man who has sex with other men (MSM).  You get hemodialysis treatment.  You take certain medicines for conditions like cancer, organ transplantation, and autoimmune conditions.  Hepatitis C blood testing is recommended for all people born from [birth year] through 1965 and any individual with known risk factors for hepatitis C.  Healthy men should no longer receive prostate-specific antigen (PSA) blood tests as part of routine cancer screening. Talk to your health care  provider about prostate cancer screening.  Testicular cancer screening is not recommended for adolescents or adult males who have no symptoms. Screening includes self-exam, a health care provider exam, and other screening tests. Consult with your health care provider about any symptoms you have or any concerns you have about testicular cancer.  Practice safe sex. Use condoms and avoid high-risk sexual practices to reduce the spread of sexually transmitted infections (STIs).  You should be screened for STIs, including gonorrhea and chlamydia if:  You are sexually active and are younger than 24 years.  You are older than 24 years, and your health care provider tells you that you are at risk for this type of infection.  Your sexual activity has changed since you were last screened, and you are at an increased risk for chlamydia or gonorrhea. Ask your health care provider if you are at risk.  If you are at risk of being infected with HIV, it is recommended that you take a prescription medicine daily to prevent HIV infection. This is called pre-exposure prophylaxis (PrEP). You are considered at risk if:  You are a man who has sex with other men (MSM).  You are a heterosexual man who is sexually active with multiple partners.  You take  drugs by injection.  You are sexually active with a partner who has HIV.  Talk with your health care provider about whether you are at high risk of being infected with HIV. If you choose to begin PrEP, you should first be tested for HIV. You should then be tested every 3 months for as long as you are taking PrEP.  Use sunscreen. Apply sunscreen liberally and repeatedly throughout the day. You should seek shade when your shadow is shorter than you. Protect yourself by wearing long sleeves, pants, a wide-brimmed hat, and sunglasses year round whenever you are outdoors.  Tell your health care provider of new moles or changes in moles, especially if there is a change in shape or color. Also, tell your health care provider if a mole is larger than the size of a pencil eraser.  A one-time screening for abdominal aortic aneurysm (AAA) and surgical repair of large AAAs by ultrasound is recommended for men aged 65-75 years who are current or former smokers.  Stay current with your vaccines (immunizations).   This information is not intended to replace advice given to you by your health care provider. Make sure you discuss any questions you have with your health care provider.   Document Released: 11/27/2007 Document Revised: 06/21/2014 Document Reviewed: 10/26/2010 Elsevier Interactive Patient Education [employer]2016 Elsevier Inc.

## 2015-12-01 NOTE — Progress Notes (Signed)
Patient ID: Bobby [name], male  DOB: August 16, 1975, 40 y.o.   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  male present for CPE with review of fatigue labs from prior visit and fasting labs for CPE.   All past medical history, surgical history, allergies, family history, immunizations, medications and social history were updated in the electronic medical record today. All recent labs, ED visits and hospitalizations within the last year were reviewed.  Elevated BP: Blood pressure still above goal today. Patient states he has not started a diet or exercise. He is also experiencing more anxiety.   Hyperlipidemia: Fhx heart disease. No current medications. Mildly elevated triglycerides and low HDL.   Vitamin deficient: Vit D 16. B12 378. 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 Pt willing to start b12 injections.   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 Labs today confirm very low testosterone and free testosterone. Pt is agreeable to start injections again.   Depression/anxiety: Phq, mood disorder and GAD completed today. Pt is currently prescribed celexa 20 mg daily he has been on this medication for a couple years. he states he has never tried the higher dose or other medications in the past. He has a family h/o of depression/anxiety and likely mood disorders.  Depression screen [facility] 2/9 12/01/2015 12/01/2015  Decreased Interest 1 0  Down, Depressed, Hopeless 2 0  PHQ - 2 Score 3 0  Altered sleeping 0 -  Tired, decreased energy 3 -  Change in appetite 2 -  Feeling bad or failure about yourself  2 -  Trouble concentrating 0 -  Moving slowly or fidgety/restless 2 -  Suicidal thoughts 0 -  PHQ-9 Score 12 -  Difficult doing work/chores Somewhat difficult -    GAD 7 : Generalized Anxiety Score 12/01/2015  Nervous, Anxious, on Edge 3  Control/stop worrying 2  Worry too much - different things 3  Trouble relaxing 3  Restless 3  Easily annoyed or irritable 3  Afraid -  awful might happen 3  Total GAD 7 Score 20  Anxiety Difficulty Very difficult    Mood disorder screen:  Borderline positive. Yes to 7 of #1 (2,3,6,7,11,12,13), yes to 2, serious problem in 3, yes to 4, and no to 5. Uncertain if ADD and low T are playing a role. Will readdress after increase celexa and supplement of deficiency.     Health maintenance:  Colonoscopy: FHX colon cancer. Screen at 50. Immunizations: tdap (2015) and Flu (2016) UTD Infectious disease screening: HIV screen indicated, will offer with next lab draw. PSA: 2.70 (2017) Assistive device: none  Oxygen use: None Patient has a Dental home. Hospitalizations/ED visits: none  Recent Results (from the past 2160 hour(s))  CBC w/Diff     Status: Abnormal   Collection Time: 11/27/15  7:58 AM  Result Value Ref Range   WBC 7.0 3.8 - 10.8 K/uL   RBC 4.64 4.20 - 5.80 MIL/uL   Hemoglobin 12.6 (L) 13.2 - 17.1 g/dL   HCT 38.8 38.5 - 50.0 %   MCV 83.6 80.0 - 100.0 fL   MCH 27.2 27.0 - 33.0 pg   MCHC 32.5 32.0 - 36.0 g/dL   RDW 15.1 (H) 11.0 - 15.0 %   Platelets 327 140 - 400 K/uL   MPV 9.7 7.5 - 12.5 fL   Neutro Abs 4200 1500 - 7800 cells/uL   Lymphs Abs 2100 850 - 3900 cells/uL   Monocytes Absolute 490 200 - 950 cells/uL  Eosinophils Absolute 140 15 - 500 cells/uL   Basophils Absolute 70 0 - 200 cells/uL   Neutrophils Relative % 60 %   Lymphocytes Relative 30 %   Monocytes Relative 7 %   Eosinophils Relative 2 %   Basophils Relative 1 %   Smear Review Criteria for review not met     Comment: ** Please note change in unit of measure and reference range(s). **  HgB A1c     Status: Abnormal   Collection Time: 11/27/15  7:58 AM  Result Value Ref Range   Hgb A1c MFr Bld 5.8 (H) <5.7 %    Comment:   For someone without known diabetes, a hemoglobin A1c value between 5.7% and 6.4% is consistent with prediabetes and should be confirmed with a follow-up test.   For someone with known diabetes, a value <7% indicates that  their diabetes is well controlled. A1c targets should be individualized based on duration of diabetes, age, co-morbid conditions and other considerations.   This assay result is consistent with an increased risk of diabetes.   Currently, no consensus exists regarding use of hemoglobin A1c for diagnosis of diabetes in children.      Mean Plasma Glucose 120 mg/dL  Lipid panel     Status: Abnormal   Collection Time: 11/27/15  7:58 AM  Result Value Ref Range   Cholesterol 219 (H) 125 - 200 mg/dL   Triglycerides 224 (H) <150 mg/dL   HDL 35 (L) >=40 mg/dL   Total CHOL/HDL Ratio 6.3 (H) <=5.0 Ratio   VLDL 45 (H) <30 mg/dL   LDL Cholesterol 139 (H) <130 mg/dL    Comment:   Total Cholesterol/HDL Ratio:CHD Risk                        Coronary Heart Disease Risk Table                                        Men       Women          1/2 Average Risk              3.4        3.3              Average Risk              5.0        4.4           2X Average Risk              9.6        7.1           3X Average Risk             23.4       11.0 Use the calculated Patient Ratio above and the CHD Risk table  to determine the patient's CHD Risk.   Comprehensive metabolic panel     Status: None   Collection Time: 11/27/15  7:58 AM  Result Value Ref Range   Sodium 138 135 - 146 mmol/L   Potassium 4.5 3.5 - 5.3 mmol/L   Chloride 105 98 - 110 mmol/L   CO2 22 20 - 31 mmol/L   Glucose, Bld 90 65 - 99 mg/dL   BUN 13 7 - 25 mg/dL   Creat 0.98 0.60 -  1.35 mg/dL   Total Bilirubin 0.5 0.2 - 1.2 mg/dL   Alkaline Phosphatase 59 40 - 115 U/L   AST 15 10 - 40 U/L   ALT 16 9 - 46 U/L   Total Protein 6.9 6.1 - 8.1 g/dL   Albumin 3.8 3.6 - 5.1 g/dL   Calcium 9.0 8.6 - 10.3 mg/dL  B12     Status: None   Collection Time: 11/27/15  7:58 AM  Result Value Ref Range   Vitamin B-12 378 200 - 1100 pg/mL  VITAMIN D 25 Hydroxy (Vit-D Deficiency, Fractures)     Status: Abnormal   Collection Time: 11/27/15  7:58 AM    Result Value Ref Range   Vit D, 25-Hydroxy 16 (L) 30 - 100 ng/mL    Comment: Vitamin D Status           25-OH Vitamin D        Deficiency                <20 ng/mL        Insufficiency         20 - 29 ng/mL        Optimal             > or = 30 ng/mL   For 25-OH Vitamin D testing on patients on D2-supplementation and patients for whom quantitation of D2 and D3 fractions is required, the QuestAssureD 25-OH VIT D, (D2,D3), LC/MS/MS is recommended: order code [phone] (patients > 2 yrs).   PSA     Status: None   Collection Time: 11/27/15  7:58 AM  Result Value Ref Range   PSA 2.70 <=4.00 ng/mL    Comment: Test Methodology: ECLIA PSA (Electrochemiluminescence Immunoassay)   For PSA values from 2.5-4.0, particularly in younger men <2 years old, the AUA and NCCN suggest testing for % Free PSA (3515) and evaluation of the rate of increase in PSA (PSA velocity).   TSH     Status: None   Collection Time: 11/27/15  7:58 AM  Result Value Ref Range   TSH 3.75 0.40 - 4.50 mIU/L  Testosterone Total,Free,Bio, Males     Status: Abnormal   Collection Time: 11/27/15  7:58 AM  Result Value Ref Range   Testosterone 155 (L) 250 - 827 ng/dL    Comment: In hypogonadal males, Testosterone, Total,LC/MS/MS is the recommended assay due to the diminished accuracy of immunoassay at levels below 250 ng/dL.  This test code [phone]) must be collected in a red-top tube with no gel.  Two morning (8 - 10 AM) specimens obtained on different days are recommended by The Endocrine Society for screening for hypogonadism.    Albumin 3.8 3.6 - 5.1 g/dL   Sex Hormone Binding 14 10 - 50 nmol/L   Testosterone, Free 36.6 (L) 47.0 - 244.0 pg/mL    Comment:   The concentration of free testosterone is derived from a mathematical expression based on constants for the binding of testosterone to sex hormone-binding globulin and albumin.    Testosterone, Bioavailable 64.1 (L) 130.5 - 681.7 ng/dL    Comment: Due to the  diminished accuracy of immunoassay at levels below 250 ng/dL, calculations of the Free and Bioavailable Testosterone are not accurate. If needed, Testosterone, Free, Bio and Total, LC/MS/MS ([id number]) is the recommended assay. This specimen must be collected in a red-top tube with no gel.   The concentration of bioavailable testosterone is derived from a mathematical expression based on constants for the binding  of testosterone to sex hormone-binding globulin and albumin.   Bioavailable testosterone includes free plus weakly bound (non-SHBG bound) testosterone.  Bioavailable testosterone is an assessment of the biologically active testosterone in serum.        Past Medical History  Diagnosis Date  . GERD (gastroesophageal reflux disease)   . Obesity     morbid  . Hyperlipidemia   . Hypertension   . Thyroid disease     Hypothyroid  . Tremor     idiopathic  . Lumbar back pain     with radiculopathy  . Hypothyroid 12/28/2010  . Fatigue 04/08/2011  . Paresthesia of skin 04/12/2011  . Vitamin B 12 deficiency 04/13/2011  . History of hypotestosteronemia 04/13/2011  . Paronychia 10/13/2011  . HTN (hypertension) 07/08/2010    Qualifier: Diagnosis of  By: Abner Greenspan MD, Misty Stanley    . Anemia   . OSA (obstructive sleep apnea)     cpap setting of 14  . ADD (attention deficit disorder with hyperactivity)     adult  . Depression with anxiety 04/12/2011  . 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 Inclusion cyst 07/08/2010    Qualifier: Diagnosis of  By: Abner Greenspan MD, Stacey     Allergies  Allergen Reactions  . Adhesive [Tape] Itching and Rash   Past Surgical History  Procedure Laterality Date  . Wisdom teeth exactraction  age 89  . Meatal surgery to urinary opening  age 61  . Cystectomy  10/2011    neck   Family History  Problem Relation Age of Onset  . Hypertension Mother   . Obesity Mother   . Cardiomyopathy Mother   . Arthritis Father   . Gout Father   . Hyperlipidemia Father   .  Hypertension Father   . Cleft palate Father   . Other Father     disassociative fugue  . Obesity Father   . ADD / ADHD Daughter     ADHD  . Coronary artery disease Maternal Grandmother     s/p multiple MI's first one in late 30's  . Other Maternal Grandmother     CHF  . Cancer Maternal Grandmother     breast  . Other Maternal Grandfather     Essential tremors  . Cancer Maternal Grandfather     spinal/ smoker/brain  . Leukemia Paternal Grandmother   . Cancer Paternal Grandmother     leukemia  . Atrial fibrillation Paternal Grandfather 47  . Hypertension Paternal Grandfather   . Other Paternal Doctor, general practice  . Cancer Maternal Uncle     colon   Social History   Social History  . Marital Status: Married    Spouse Name: N/A  . Number of Children: N/A  . Years of Education: N/A   Occupational History  . Not on file.   Social History Main Topics  . Smoking status: Never Smoker   . Smokeless tobacco: Never Used  . Alcohol Use: No     Comment: Rare - 1-2x/year  . Drug Use: No  . Sexual Activity: Not on file   Other Topics Concern  . Not on file   Social History Narrative     ROS: 14 pt review of systems performed and negative (unless mentioned in an HPI)  Objective: BP 157/93 mmHg  Pulse 66  Temp(Src) 98 F (36.7 C)  Resp 20  Ht 6\' 1"  (1.854 m)  Wt 427 lb 4 oz (193.799 kg)  BMI 56.38 kg/m2  SpO2 96% Gen: Afebrile.  No acute distress. Nontoxic in appearance, well-developed, well-nourished, male, morbidly obese, pleasant.  HENT: AT. [place]. Bilateral TM visualized and normal in appearance, normal external auditory canal. MMM, no oral lesions, good dentition. Bilateral nares wnl. Throat w/o erythema, ulcerations or exudates. no Cough on exam, no hoarseness on exam. Eyes:Pupils Equal Round Reactive to light, Extraocular movements intact,  Conjunctiva without redness, discharge or icterus. Neck/lymp/endocrine: Supple,no lymphadenopathy, no  thyromegaly CV: RRR no murmur, trace (R>L) edema, +2/4 P posterior tibialis pulses.  Chest: CTAB, no wheeze, rhonchi or crackles. normal Respiratory effort. good Air movement. Abd: Soft. obese. NTND. BS present. no Masses palpated. No hepatosplenomegaly. No rebound tenderness or guarding. Skin: no rashes, purpura or petechiae. Warm and well-perfused. Skin intact. Neuro/Msk:  Normal gait. PERLA. EOMi. Alert. Oriented x3.  Cranial nerves II through XII intact. Muscle strength 5/5 U/L extremity. DTRs equal bilaterally. Psych: Normal affect, dress and demeanor. Normal speech. Normal thought content and judgment.   Assessment/plan: Bobby [name] is a 40 y.o. male present for annual exam and followup on fatigue: Vitamin D deficiency - retest in 10-12 weeks  - Vitamin D, Ergocalciferol, (DRISDOL) 50000 units CAPS capsule; 1 cap 2x a week.  Dispense: 20 capsule; Refill: 0  Depression with anxiety Increase celexa to 40, f/u 4 weeks. If not improved would consider adding another agent.  - citalopram (CELEXA) 40 MG tablet; Take 1 tablet (40 mg total) by mouth daily.  Dispense: 30 tablet; Refill: 3  Hypogonadotropic hypogonadism (HCC) - restart injections. Retest in 3 mos.  - testosterone cypionate (DEPOTESTOSTERONE CYPIONATE) 200 MG/ML injection; Inject 1 mL (200 mg total) into the muscle every 14 (fourteen) days.  Dispense: 10 mL; Refill: 1  BMI 50.0-59.9, adult (HCC)/Morbid obesity, unspecified obesity type ([place]) Pt strongly encouraged to exercise > 150 minutes a week and start diet.   Vitamin B 12 deficiency - low normal B12, pt with IF deficiency.  - start injections every 2 weeks (with testosterone) for 4 doses then monthly.  - retest with other labs in 3 mos.   Essential hypertension Discussed with patient, will follow in 4 weeks at next appt. He is start diet/exercise and increase anxiety medications.  - if BP above 140/90 will start HCTZ 25 mg BID.  Other fatigue - discussed  multifactorial: vitamin deficient, low test, depression/anxiety, borderline BP. Each has been addressed  Encounter for general adult medical examination with abnormal findings Patient was encouraged to exercise greater than 150 minutes a week. Patient was encouraged to choose a diet filled with fresh fruits and vegetables, and lean meats. AVS provided to patient today for education/recommendation on gender specific health and safety maintenance.  Return in about 4 weeks (around 12/29/2015) for depression/anxiety.  Electronically signed by: Howard Pouch, DO [place]

## 2015-12-29 ENCOUNTER — Encounter: Payer: Self-pay | Admitting: Family Medicine

## 2015-12-29 ENCOUNTER — Ambulatory Visit (INDEPENDENT_AMBULATORY_CARE_PROVIDER_SITE_OTHER): Payer: 59 | Admitting: Family Medicine

## 2015-12-29 VITALS — BP 131/81 | HR 68 | Temp 98.2°F | Resp 20 | Ht 73.0 in | Wt >= 6400 oz

## 2015-12-29 DIAGNOSIS — E230 Hypopituitarism: Secondary | ICD-10-CM

## 2015-12-29 DIAGNOSIS — F418 Other specified anxiety disorders: Secondary | ICD-10-CM | POA: Diagnosis not present

## 2015-12-29 DIAGNOSIS — E538 Deficiency of other specified B group vitamins: Secondary | ICD-10-CM | POA: Diagnosis not present

## 2015-12-29 MED ORDER — CYANOCOBALAMIN 1000 MCG/ML IJ SOLN: 1000.0000 ug | INTRAMUSCULAR | Status: DC

## 2015-12-29 MED ORDER — CITALOPRAM HYDROBROMIDE 40 MG PO TABS: 40.0000 mg | ORAL_TABLET | Freq: Every day | ORAL | Status: DC

## 2015-12-29 NOTE — Progress Notes (Signed)
Patient ID: Bobby Bobby [name], male   DOB: 1976/03/22, 40 y.o.   MRN: [id number]      Patient ID: Bobby Bobby [name], male  DOB: [birth year], 40 y.o.   MRN: [id number]  Subjective:  Bobby Bobby [name] is a 40 y.o.  male present for follow up depression/anxiety.  Elevated BP: Patient states he has started a diet, not started exercise yet.   Hypogonadotropic hypogonadism/b12 deficiency: pt has not started his injections, he forgot he had to pick it up and bring it in to office to start. He will do so this week and start every 2 week injections of testosterone and b12.  Depression/anxiety:  Pt is currently prescribed (increased to) celexa 40 mg daily. He feels he is doing better and mood is better controlled on increased dose. He denies side effects. He has a family h/o of depression/anxiety and likely mood disorders.   Depression screen Bobby Bobby [name]  Decreased Interest 1 0  Down, Depressed, Hopeless 2 0  PHQ - 2 Score 3 0  Altered sleeping 0 -  Tired, decreased energy 3 -  Change in appetite 2 -  Feeling bad or failure about yourself  2 -  Trouble concentrating 0 -  Moving slowly or fidgety/restless 2 -  Suicidal thoughts 0 -  PHQ-9 Score 12 -  Difficult doing work/chores Somewhat difficult -    GAD 7 : Generalized Anxiety Score Bobby [name]  Nervous, Anxious, on Edge 3  Control/stop worrying 2  Worry too much - different things 3  Trouble relaxing 3  Restless 3  Easily annoyed or irritable 3  Afraid - awful might happen 3  Total GAD 7 Score 20  Anxiety Difficulty Very difficult    Recent Results (from the past 2160 hour(s))  CBC w/Diff     Status: Abnormal   Collection Time: 11/27/15  7:58 AM  Result Value Ref Range   WBC 7.0 3.8 - 10.8 K/uL   RBC 4.64 4.20 - 5.80 MIL/uL   Hemoglobin 12.6 (L) 13.2 - 17.1 g/dL   HCT 38.8 38.5 - 50.0 %   MCV 83.6 80.0 - 100.0 fL   MCH 27.2 27.0 - 33.0 pg   MCHC 32.5 32.0 - 36.0 g/dL   RDW 15.1 (H) 11.0 - 15.0 %   Platelets 327 140 -  400 K/uL   MPV 9.7 7.5 - 12.5 fL   Neutro Abs 4200 1500 - 7800 cells/uL   Lymphs Abs 2100 850 - 3900 cells/uL   Monocytes Absolute 490 200 - 950 cells/uL   Eosinophils Absolute 140 15 - 500 cells/uL   Basophils Absolute 70 0 - 200 cells/uL   Neutrophils Relative % 60 %   Lymphocytes Relative 30 %   Monocytes Relative 7 %   Eosinophils Relative 2 %   Basophils Relative 1 %   Smear Review Criteria for review not met     Comment: ** Please note change in unit of measure and reference range(s). **  HgB A1c     Status: Abnormal   Collection Time: 11/27/15  7:58 AM  Result Value Ref Range   Hgb A1c MFr Bld 5.8 (H) <5.7 %    Comment:   For someone without known diabetes, a hemoglobin A1c value between 5.7% and 6.4% is consistent with prediabetes and should be confirmed with a follow-up test.   For someone with known diabetes, a value <7% indicates that their diabetes is well controlled. A1c targets should be individualized based on duration of diabetes,  age, co-morbid conditions and other considerations.   This assay result is consistent with an increased risk of diabetes.   Currently, no consensus exists regarding use of hemoglobin A1c for diagnosis of diabetes in children.      Mean Plasma Glucose 120 mg/dL  Lipid panel     Status: Abnormal   Collection Time: 11/27/15  7:58 AM  Result Value Ref Range   Cholesterol 219 (H) 125 - 200 mg/dL   Triglycerides 224 (H) <150 mg/dL   HDL 35 (L) >=40 mg/dL   Total CHOL/HDL Ratio 6.3 (H) <=5.0 Ratio   VLDL 45 (H) <30 mg/dL   LDL Cholesterol 139 (H) <130 mg/dL    Comment:   Total Cholesterol/HDL Ratio:CHD Risk                        Coronary Heart Disease Risk Table                                        Men       Women          1/2 Average Risk              3.4        3.3              Average Risk              5.0        4.4           2X Average Risk              9.6        7.1           3X Average Risk             23.4        11.0 Use the calculated Patient Ratio above and the CHD Risk table  to determine the patient's CHD Risk.   Comprehensive metabolic panel     Status: None   Collection Time: 11/27/15  7:58 AM  Result Value Ref Range   Sodium 138 135 - 146 mmol/L   Potassium 4.5 3.5 - 5.3 mmol/L   Chloride 105 98 - 110 mmol/L   CO2 22 20 - 31 mmol/L   Glucose, Bld 90 65 - 99 mg/dL   BUN 13 7 - 25 mg/dL   Creat 0.98 0.60 - 1.35 mg/dL   Total Bilirubin 0.5 0.2 - 1.2 mg/dL   Alkaline Phosphatase 59 40 - 115 U/L   AST 15 10 - 40 U/L   ALT 16 9 - 46 U/L   Total Protein 6.9 6.1 - 8.1 g/dL   Albumin 3.8 3.6 - 5.1 g/dL   Calcium 9.0 8.6 - 10.3 mg/dL  B12     Status: None   Collection Time: 11/27/15  7:58 AM  Result Value Ref Range   Vitamin B-12 378 200 - 1100 pg/mL  VITAMIN D 25 Hydroxy (Vit-D Deficiency, Fractures)     Status: Abnormal   Collection Time: 11/27/15  7:58 AM  Result Value Ref Range   Vit D, 25-Hydroxy 16 (L) 30 - 100 ng/mL    Comment: Vitamin D Status           25-OH Vitamin D        Deficiency                <  20 ng/mL        Insufficiency         20 - 29 ng/mL        Optimal             > or = 30 ng/mL   For 25-OH Vitamin D testing on patients on D2-supplementation and patients for whom quantitation of D2 and D3 fractions is required, the QuestAssureD 25-OH VIT D, (D2,D3), LC/MS/MS is recommended: order code [id number] (patients > 2 yrs).   PSA     Status: None   Collection Time: 11/27/15  7:58 AM  Result Value Ref Range   PSA 2.70 <=4.00 ng/mL    Comment: Test Methodology: ECLIA PSA (Electrochemiluminescence Immunoassay)   For PSA values from 2.5-4.0, particularly in younger men <79 years old, the AUA and NCCN suggest testing for % Free PSA (3515) and evaluation of the rate of increase in PSA (PSA velocity).   TSH     Status: None   Collection Time: 11/27/15  7:58 AM  Result Value Ref Range   TSH 3.75 0.40 - 4.50 mIU/L  Testosterone Total,Free,Bio, Males     Status: Abnormal    Collection Time: 11/27/15  7:58 AM  Result Value Ref Range   Testosterone 155 (L) 250 - 827 ng/dL    Comment: In hypogonadal males, Testosterone, Total,LC/MS/MS is the recommended assay due to the diminished accuracy of immunoassay at levels below 250 ng/dL.  This test code [id number]) must be collected in a red-top tube with no gel.  Two morning (8 - 10 AM) specimens obtained on different days are recommended by The Endocrine Society for screening for hypogonadism.    Albumin 3.8 3.6 - 5.1 g/dL   Sex Hormone Binding 14 10 - 50 nmol/L   Testosterone, Free 36.6 (L) 47.0 - 244.0 pg/mL    Comment:   The concentration of free testosterone is derived from a mathematical expression based on constants for the binding of testosterone to sex hormone-binding globulin and albumin.    Testosterone, Bioavailable 64.1 (L) 130.5 - 681.7 ng/dL    Comment: Due to the diminished accuracy of immunoassay at levels below 250 ng/dL, calculations of the Free and Bioavailable Testosterone are not accurate. If needed, Testosterone, Free, Bio and Total, LC/MS/MS ([id number]) is the recommended assay. This specimen must be collected in a red-top tube with no gel.   The concentration of bioavailable testosterone is derived from a mathematical expression based on constants for the binding of testosterone to sex hormone-binding globulin and albumin.   Bioavailable testosterone includes free plus weakly bound (non-SHBG bound) testosterone.  Bioavailable testosterone is an assessment of the biologically active testosterone in serum.        Past Medical History  Diagnosis Date  . GERD (gastroesophageal reflux disease)   . Obesity     morbid  . Hyperlipidemia   . Hypertension   . Thyroid disease     Hypothyroid  . Tremor     idiopathic  . Lumbar back pain     with radiculopathy  . Hypothyroid 12/28/2010  . Fatigue 04/08/2011  . Paresthesia of skin 04/12/2011  . Vitamin B 12 deficiency 04/13/2011  .  History of hypotestosteronemia 04/13/2011  . Paronychia 10/13/2011  . HTN (hypertension) 07/08/2010    Qualifier: Diagnosis of  By: Charlett Blake MD, Erline Levine    . Anemia   . OSA (obstructive sleep apnea)     cpap setting of 14  . ADD (attention deficit disorder with hyperactivity)  adult  . Depression with anxiety 04/12/2011  . 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 Inclusion cyst 07/08/2010    Qualifier: Diagnosis of  By: Charlett Blake MD, Stacey     Allergies  Allergen Reactions  . Adhesive [Tape] Itching and Rash   Past Surgical History  Procedure Laterality Date  . Wisdom teeth exactraction  age 69  . Meatal surgery to urinary opening  age 52  . Cystectomy  10/2011    neck   Family History  Problem Relation Age of Onset  . Hypertension Mother   . Obesity Mother   . Cardiomyopathy Mother   . Arthritis Father   . Gout Father   . Hyperlipidemia Father   . Hypertension Father   . Cleft palate Father   . Other Father     disassociative fugue  . Obesity Father   . ADD / ADHD Daughter     ADHD  . Coronary artery disease Maternal Grandmother     s/p multiple MI's first one in late 9's  . Other Maternal Grandmother     CHF  . Cancer Maternal Grandmother     breast  . Other Maternal Grandfather     Essential tremors  . Cancer Maternal Grandfather     spinal/ smoker/brain  . Leukemia Paternal Grandmother   . Cancer Paternal Grandmother     leukemia  . Atrial fibrillation Paternal Grandfather 93  . Hypertension Paternal Grandfather   . Other Paternal Estate manager/land agent  . Cancer Maternal Uncle     colon   Social History   Social History  . Marital Status: Married    Spouse Name: N/A  . Number of Children: N/A  . Years of Education: N/A   Occupational History  . Not on file.   Social History Main Topics  . Smoking status: Never Smoker   . Smokeless tobacco: Never Used  . Alcohol Use: No     Comment: Rare - 1-2x/year  . Drug Use: No  . Sexual Activity: Not on  file   Other Topics Concern  . Not on file   Social History Narrative     ROS: 14 pt review of systems performed and negative (unless mentioned in an HPI)  Objective: BP 131/81 mmHg  Pulse 68  Temp(Src) 98.2 F (36.8 C)  Resp 20  Ht 6' 1"  (1.854 m)  Wt 428 lb 4 oz (194.253 kg)  BMI 56.51 kg/m2  SpO2 96% Gen: Afebrile. No acute distress. Nontoxic in appearance, well-developed, well-nourished, male, morbidly obese, pleasant.  HENT: AT. [place].  MMM Eyes:Pupils Equal Round Reactive to light, Extraocular movements intact,  Conjunctiva without redness, discharge or icterus. Neuro/Msk:  Normal gait. PERLA. EOMi. Alert. Oriented x3.   Psych: Normal affect, dress and demeanor. Normal speech. Normal thought content and judgment.   Assessment/plan: JAHLANI LORENTZ is a 40 y.o. male present for annual exam and followup on fatigue: Depression with anxiety - Continue celexa to 40; 90 day refill - F/U 3 months.   Hypogonadotropic hypogonadism (HCC) - restart injections. Retest in 3 mos.  - testosterone cypionate (DEPOTESTOSTERONE CYPIONATE) 200 MG/ML injection; Inject 1 mL (200 mg total) into the muscle every 14 (fourteen) days.  Dispense: 10 mL; Refill: 1 - testosterone (future) 1 week after after 5 th dose., with provider follow up on day of 6th dose due.   Vitamin B 12 deficiency - low normal B12, pt with IF deficiency.  - start injections every 2 weeks (with testosterone) for  4 doses then monthly.  - retest with other labs in 3 mos.  - b12 (future)  Essential hypertension - better with anxiety control. Continue to monitor.  - low salt, continue to watch diet and stat exercise.    Return in about 3 months (around 03/30/2016) for depression/hormone supplement.  Electronically signed by: Howard Pouch, DO [place]

## 2015-12-29 NOTE — Patient Instructions (Signed)
Pick up testosterone and bring to office, we will start testosterone injections and b12 injections.  Will need to recheck all levels in 3 months. Lab appt to be scheduled 1 week after 5 th injection, then f/u provider appt 4 days after collection of labs.  Lab appt needed and provider appt needed (after 5 th injection)

## 2016-07-14 ENCOUNTER — Other Ambulatory Visit: Payer: Self-pay | Admitting: *Deleted

## 2016-07-14 ENCOUNTER — Encounter: Payer: Self-pay | Admitting: *Deleted

## 2016-07-14 ENCOUNTER — Telehealth: Payer: Self-pay | Admitting: Family Medicine

## 2016-07-14 DIAGNOSIS — F418 Other specified anxiety disorders: Secondary | ICD-10-CM

## 2016-07-14 MED ORDER — CITALOPRAM HYDROBROMIDE 40 MG PO TABS
40.0000 mg | ORAL_TABLET | Freq: Every day | ORAL | 0 refills | Status: DC
Start: 2016-07-14 — End: 2016-07-16

## 2016-07-14 NOTE — Telephone Encounter (Signed)
Pt needs refill on citalopram, [facility] in [place].

## 2016-07-14 NOTE — Telephone Encounter (Signed)
Refilled patients citalopram for 30 day supply. Patient was due for follow up appt in October. Patient needs office visit prior to anymore refills. Sent patient a message ion my chart.

## 2016-07-16 ENCOUNTER — Telehealth: Payer: Self-pay | Admitting: Family Medicine

## 2016-07-16 ENCOUNTER — Encounter: Payer: Self-pay | Admitting: Family Medicine

## 2016-07-16 ENCOUNTER — Ambulatory Visit (INDEPENDENT_AMBULATORY_CARE_PROVIDER_SITE_OTHER): Payer: Self-pay | Admitting: Family Medicine

## 2016-07-16 ENCOUNTER — Encounter: Payer: Self-pay | Admitting: Nurse Practitioner

## 2016-07-16 VITALS — BP 134/88 | HR 64 | Temp 97.8°F | Resp 20 | Ht 73.0 in | Wt >= 6400 oz

## 2016-07-16 DIAGNOSIS — E230 Hypopituitarism: Secondary | ICD-10-CM

## 2016-07-16 DIAGNOSIS — F418 Other specified anxiety disorders: Secondary | ICD-10-CM

## 2016-07-16 DIAGNOSIS — R159 Full incontinence of feces: Secondary | ICD-10-CM

## 2016-07-16 DIAGNOSIS — H6121 Impacted cerumen, right ear: Secondary | ICD-10-CM

## 2016-07-16 DIAGNOSIS — Z23 Encounter for immunization: Secondary | ICD-10-CM

## 2016-07-16 DIAGNOSIS — E538 Deficiency of other specified B group vitamins: Secondary | ICD-10-CM

## 2016-07-16 DIAGNOSIS — K625 Hemorrhage of anus and rectum: Secondary | ICD-10-CM

## 2016-07-16 MED ORDER — ALIGN 4 MG PO CAPS: 1.0000 | ORAL_CAPSULE | Freq: Every day | ORAL | 0 refills | Status: DC

## 2016-07-16 MED ORDER — TESTOSTERONE CYPIONATE 200 MG/ML IM SOLN: 200.0000 mg | INTRAMUSCULAR | 1 refills | Status: DC

## 2016-07-16 MED ORDER — CITALOPRAM HYDROBROMIDE 40 MG PO TABS: 40.0000 mg | ORAL_TABLET | Freq: Every day | ORAL | 1 refills | Status: DC

## 2016-07-16 MED ORDER — POLYETHYLENE GLYCOL 3350 17 GM/SCOOP PO POWD: 17.0000 g | Freq: Two times a day (BID) | ORAL | 1 refills | Status: DC | PRN

## 2016-07-16 MED ORDER — CARBAMIDE PEROXIDE 6.5 % OT SOLN: 5.0000 [drp] | Freq: Two times a day (BID) | OTIC | 1 refills | Status: DC

## 2016-07-16 NOTE — Telephone Encounter (Signed)
Patient called stating that he is at his [facility] pharamcy and they have not received any Rx's that were supposed to be sent in today.  Please advise.

## 2016-07-16 NOTE — Telephone Encounter (Signed)
Rx's sent to pharmacy by Dr Claiborne BillingsKuneff.

## 2016-07-16 NOTE — Patient Instructions (Signed)
I have called in debrox for your years. I have called in your testosterone, you will bring the med in to the office when it ready at your pharmacy and we will store it for you. You will make a nurse visit every 2 weeks to have injection.  Make a lab appt 1 week after 5 th injection to have levels tested and a provider appt at the 6 th dose to discuss.   Please use align daily (probiotic) I called this in for you, but may need to purchase over the counter.   Start Miralax or other stool softener daily and taper to your BM so you are having routine soft BM without diarrhea. I have referred you to a specialist to discuss issue.     About Hemorrhoids  Hemorrhoids are swollen veins in the lower rectum and anus.  Also called piles, hemorrhoids are a common problem.  Hemorrhoids may be internal (inside the rectum) or external (around the anus).  Internal Hemorrhoids  Internal hemorrhoids are often painless, but they rarely cause bleeding.  The internal veins may stretch and fall down (prolapse) through the anus to the outside of the body.  The veins may then become irritated and painful.  External Hemorrhoids  External hemorrhoids can be easily seen or felt around the anal opening.  They are under the skin around the anus.  When the swollen veins are scratched or broken by straining, rubbing or wiping they sometimes bleed.  How Hemorrhoids Occur  Veins in the rectum and around the anus tend to swell under pressure.  Hemorrhoids can result from increased pressure in the veins of your anus or rectum.  Some sources of pressure are:   Straining to have a bowel movement because of constipation  Waiting too long to have a bowel movement  Coughing and sneezing often  Sitting for extended periods of time, including on the toilet  Diarrhea  Obesity  Trauma or injury to the anus  Some liver diseases  Stress  Family history of hemorrhoids  Pregnancy  Pregnant women should try to avoid  becoming constipated, because they are more likely to have hemorrhoids during pregnancy.  In the last trimester of pregnancy, the enlarged uterus may press on blood vessels and causes hemorrhoids.  In addition, the strain of childbirth sometimes causes hemorrhoids after the birth.  Symptoms of Hemorrhoids  Some symptoms of hemorrhoids include:  Swelling and/or a tender lump around the anus  Itching, mild burning and bleeding around the anus  Painful bowel movements with or without constipation  Bright red blood covering the stool, on toilet paper or in the toilet bowel.   Symptoms usually go away within a few days.  Always talk to your doctor about any bleeding to make sure it is not from some other causes.  Diagnosing and Treating Hemorrhoids  Diagnosis is made by an examination by your healthcare provider.  Special test can be performed by your doctor.    Most cases of hemorrhoids can be treated with:  High-fiber diet: Eat more high-fiber foods, which help prevent constipation.  Ask for more detailed fiber information on types and sources of fiber from your healthcare provider.  Fluids: Drink plenty of water.  This helps soften bowel movements so they are easier to pass.  Sitz baths and cold packs: Sitting in lukewarm water two or three times a day for 15 minutes cleases the anal area and may relieve discomfort.  If the water is too hot, swelling around the  anus will get worse.  Placing a cloth-covered ice pack on the anus for ten minutes four times a day can also help reduce selling.  Gently pushing a prolapsed hemorrhoid back inside after the bath or ice pack can be helpful.  Medications: For mild discomfort, your healthcare provider may suggest over-the-counter pain medication or prescribe a cream or ointment for topical use.  The cream may contain witch hazel, zinc oxide or petroleum jelly.  Medicated suppositories are also a treatment option.  Always consult your doctor before  applying medications or creams.  Procedures and surgeries: There are also a number of procedures and surgeries to shrink or remove hemorrhoids in more serious cases.  Talk to your physician about these options.  You can often prevent hemorrhoids or keep them from becoming worse by maintaining a healthy lifestyle.  Eat a fiber-rich diet of fruits, vegetables and whole grains.  Also, drink plenty of water and exercise regularly.   2007, [employer] Doc.30

## 2016-07-16 NOTE — Progress Notes (Signed)
Patient ID: Bobby [name], male   DOB: [birth year], 41 y.o.   MRN: [id number]      Patient ID: Bobby [name], male  DOB: 1976-03-28, 41 y.o.   MRN: [id number]  Subjective:  Bobby [name] is a 41 y.o.  male present for follow up depression/anxiety.  Hypogonadotropic hypogonadism/b12 deficiency: pt has not started his injections again. He states he switched over insurances and wanted to wait it his benefits were settled before starting injections. He also did not start the B12 injections for the same reasons. He states he did start a daily multivitamin.  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   Depression/anxiety:  Pt states he is doing well on the celexa and certainly noticed a difference when he ran out if his medication for a few days, secondary to the phramacy needing to order it.   Right ear discomfort: He states he notice when he went to clean his ears out he felt like he pushed something "mushy" back in his ear. He has had decreased hearing since.   Fecal incontinence: Pt states for the last few months he has had increase in "fluctuations in his bowels". 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 He now is experiencing fecal incontinence daily. He reports not having a sensation he has to have a BM prior to the incontinence. He reports a daily BM that sometimes he needs to strain for, he denies diarrhea. He endorses BRBPR. He denies rectal pain. He report his maternal uncle had colon cancer.   Depression screen [facility] 2/9 12/01/2015 12/01/2015  Decreased Interest 1 0  Down, Depressed, Hopeless 2 0  PHQ - 2 Score 3 0  Altered sleeping 0 -  Tired, decreased energy 3 -  Change in appetite 2 -  Feeling bad or failure about yourself  2 -  Trouble concentrating 0 -  Moving slowly or fidgety/restless 2 -  Suicidal thoughts 0 -  PHQ-9 Score 12 -  Difficult doing work/chores Somewhat difficult -    GAD 7 : Generalized Anxiety Score 12/01/2015  Nervous, Anxious, on Edge 3    Control/stop worrying 2  Worry too much - different things 3  Trouble relaxing 3  Restless 3  Easily annoyed or irritable 3  Afraid - awful might happen 3  Total GAD 7 Score 20  Anxiety Difficulty Very difficult    No results found for this or any previous visit (from the past 2160 hour(s)).   Past Medical History:  Diagnosis Date  . ADD (attention deficit disorder with hyperactivity)    adult  . Anemia   . Depression with anxiety 04/12/2011  . Fatigue 04/08/2011  . GERD (gastroesophageal reflux disease)   . History of hypotestosteronemia 04/13/2011  . HTN (hypertension) 07/08/2010   Qualifier: Diagnosis of  By: Abner Greenspan MD, Misty Stanley    . Hyperlipidemia   . Hypertension   . Hypothyroid 12/28/2010  . Inclusion cyst 07/08/2010   Qualifier: Diagnosis of  By: Abner Greenspan MD, Misty Stanley    . Lumbar back pain    with radiculopathy  . 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 Obesity    morbid  . OSA (obstructive sleep apnea)    cpap setting of 14  . Paresthesia of skin 04/12/2011  . Paronychia 10/13/2011  . Thyroid disease    Hypothyroid  . Tremor    idiopathic  . Vitamin B 12 deficiency 04/13/2011   Allergies  Allergen Reactions  . Adhesive [Tape] Itching and Rash   Past  Surgical History:  Procedure Laterality Date  . CYSTECTOMY  10/2011   neck  . meatal surgery to urinary opening  age 21  . wisdom teeth exactraction  age 41   Family History  Problem Relation Age of Onset  . Hypertension Mother   . Obesity Mother   . Cardiomyopathy Mother   . Arthritis Father   . Gout Father   . Hyperlipidemia Father   . Hypertension Father   . Cleft palate Father   . Other Father     disassociative fugue  . Obesity Father   . ADD / ADHD Daughter     ADHD  . Coronary artery disease Maternal Grandmother     s/p multiple MI's first one in late 5630's  . Other Maternal Grandmother     CHF  . Cancer Maternal Grandmother     breast  . Other Maternal Grandfather     Essential tremors  . Cancer  Maternal Grandfather     spinal/ smoker/brain  . Leukemia Paternal Grandmother   . Cancer Paternal Grandmother     leukemia  . Atrial fibrillation Paternal Grandfather [id number]  . Hypertension Paternal Grandfather   . Other Paternal Doctor, general practiceGrandfather     pacer/defibrillator  . Cancer Maternal Uncle     colon   Social History   Social History  . Marital status: Married    Spouse name: N/A  . Number of children: N/A  . Years of education: N/A   Occupational History  . Not on file.   Social History Main Topics  . Smoking status: Never Smoker  . Smokeless tobacco: Never Used  . Alcohol use No     Comment: Rare - 1-2x/year  . Drug use: No  . Sexual activity: Not on file   Other Topics Concern  . Not on file   Social History Narrative  . No narrative on file     ROS: 14 pt review of systems performed and negative (unless mentioned in an HPI)  Objective: BP 134/88 (BP Location: Right Arm, Patient Position: Sitting, Cuff Size: Large)   Pulse 64   Temp 97.8 F (36.6 C)   Resp 20   Ht 6\' 1"  (1.854 m)   Wt (!) 444 lb 4 oz (201.5 kg)   SpO2 99%   BMI 58.61 kg/m  Gen: Afebrile. No acute distress. Morbidly obese male. Very pleasant.  HENT: AT. [place]. Left TM WNL, right TM unale to be visualized secondary to cerumen impaction.   Eyes:Pupils Equal Round Reactive to light, Extraocular movements intact,  Conjunctiva without redness, discharge or icterus. CV: RRR, no edema Chest: CTAB, no wheeze or crackles Abd: Soft. Morbidly obese. NTND. BS present Neuro: Normal gait. PERLA. EOMi. Alert. Oriented x3  Psych: Normal affect, dress and demeanor. Normal speech. Normal thought content and judgment.  Assessment/plan: Bobby NoseWilliam P [name] is a 41 y.o. male present for annual exam and followup on fatigue: Depression with anxiety - doing well on Celexa.  - continue current regimen refills provided. - follow every 6 mos as long as doing well.   Hypogonadotropic hypogonadism (HCC)/Morbid obesity  (HCC) - he had not initiated treatment, will start now. Reordered testosterone.  - restart injections. Retest in 3 mos.  - testosterone cypionate (DEPOTESTOSTERONE CYPIONATE) 200 MG/ML injection; Inject 1 mL (200 mg total) into the muscle every 14 (fourteen) days.  Dispense: 10 mL; Refill: 1 - testosterone (future) 1 week after after 5 th dose., with provider follow up on day of 6th dose due.  Vitamin B 12 deficiency - pt had not initiated tx secondary to insurance issues. Will move forward with treatment. 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  - start injections every 2 weeks (with testosterone) for 4 doses then monthly.  - retest with other labs in 3 mos.  - b12 (future)  Immunization due - Flu Vaccine QUAD 36+ mos PF IM (Fluarix & Fluzone Quad PF)  Impacted cerumen of right ear Debrox prescribed. Stop Q-Tip use inside the ear canal.   Incontinence of feces, unspecified fecal incontinence type/Rectal bleeding - start ALIGN daily - start miralax taper to formed, but soft daily BM.  - Discussed fiber/water.  - Pt declined suppository use.  - discussed the need for GI to evaluate and maybe even need Gen surgery for tx of hemorrhoids if felt warranted after GI eval.    Return in about 3 months (around 10/13/2016), or testosterone . and b12 recheck  Electronically signed by: Felix Pacini, DO [facility] [place]

## 2016-07-27 ENCOUNTER — Ambulatory Visit (INDEPENDENT_AMBULATORY_CARE_PROVIDER_SITE_OTHER): Payer: PRIVATE HEALTH INSURANCE | Admitting: Nurse Practitioner

## 2016-07-27 ENCOUNTER — Encounter: Payer: Self-pay | Admitting: Nurse Practitioner

## 2016-07-27 ENCOUNTER — Other Ambulatory Visit (INDEPENDENT_AMBULATORY_CARE_PROVIDER_SITE_OTHER): Payer: PRIVATE HEALTH INSURANCE

## 2016-07-27 VITALS — BP 100/70 | HR 70 | Ht 73.0 in | Wt >= 6400 oz

## 2016-07-27 DIAGNOSIS — R159 Full incontinence of feces: Secondary | ICD-10-CM

## 2016-07-27 DIAGNOSIS — K625 Hemorrhage of anus and rectum: Secondary | ICD-10-CM

## 2016-07-27 LAB — CBC
HCT: 38.3 % — ABNORMAL LOW (ref 39.0–52.0)
HEMOGLOBIN: 12.8 g/dL — AB (ref 13.0–17.0)
MCHC: 33.3 g/dL (ref 30.0–36.0)
MCV: 83.8 fl (ref 78.0–100.0)
Platelets: 322 10*3/uL (ref 150.0–400.0)
RBC: 4.57 Mil/uL (ref 4.22–5.81)
RDW: 15 % (ref 11.5–15.5)
WBC: 8.5 10*3/uL (ref 4.0–10.5)

## 2016-07-27 NOTE — Progress Notes (Signed)
HPI:  Bobby [name] is a 41 year old male referred by PCP, Dr. Felix Pacini,  for fecal incontinence and rectal bleeding. Patient gives a one-year history of intermittent fecal leakage which has become progressively worse over the last few months. He is also having some rectal bleeding with bowel movements. Patient has a daily formed stool and feels he evacuates rectum adequately with defecation and is definitely not constipated. Hours after defecation or sometimes in the am before defecation patient finds that he has involuntarily passed some soft stool. This is happening on a daily basis. He has intermittent discomfort inside of the rectum described as aching and throbbing. No lower abdominal pain. No colon cancer in immediate family members, just one uncle. No urinary incontinence. Stress spinal injuries.  Most recent labs in Epic include a CBC revealing a hemoglobin of 12.6, MCV 83. Chem profile unremarkable.. Vitamin D low at 16. Vitamin B12 378.   Past Medical History:  Diagnosis Date  . ADD (attention deficit disorder with hyperactivity)    adult  . Anemia   . Depression with anxiety 04/12/2011  . Fatigue 04/08/2011  . GERD (gastroesophageal reflux disease)   . History of hypotestosteronemia 04/13/2011  . HTN (hypertension) 07/08/2010   Qualifier: Diagnosis of  By: Abner Greenspan MD, Misty Stanley    . Hyperlipidemia   . Hypertension   . Hypothyroid 12/28/2010  . Inclusion cyst 07/08/2010   Qualifier: Diagnosis of  By: Abner Greenspan MD, Misty Stanley    . Lumbar back pain    with radiculopathy  . 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 Obesity    morbid  . OSA (obstructive sleep apnea)    cpap setting of 14  . Paresthesia of skin 04/12/2011  . Paronychia 10/13/2011  . Thyroid disease    Hypothyroid  . Tremor    idiopathic  . Vitamin B 12 deficiency 04/13/2011     Past Surgical History:  Procedure Laterality Date  . CYSTECTOMY  10/2011   neck  . meatal surgery to urinary opening  age 72  . wisdom teeth  exactraction  age 89   Family History  Problem Relation Age of Onset  . Hypertension Mother   . Obesity Mother   . Cardiomyopathy Mother   . Arthritis Father   . Gout Father   . Hyperlipidemia Father   . Hypertension Father   . Cleft palate Father   . Other Father     disassociative fugue  . Obesity Father   . ADD / ADHD Daughter     ADHD  . Coronary artery disease Maternal Grandmother     s/p multiple MI's first one in late 12's  . Other Maternal Grandmother     CHF  . Cancer Maternal Grandmother     breast  . Other Maternal Grandfather     Essential tremors  . Cancer Maternal Grandfather     spinal/ smoker/brain  . Leukemia Paternal Grandmother   . Cancer Paternal Grandmother     leukemia  . Atrial fibrillation Paternal Grandfather 74  . Hypertension Paternal Grandfather   . Other Paternal Doctor, general practice  . Cancer Maternal Uncle     colon   Social History  Substance Use Topics  . Smoking status: Never Smoker  . Smokeless tobacco: Never Used  . Alcohol use No     Comment: Rare - 1-2x/year   Current Outpatient Prescriptions  Medication Sig Dispense Refill  . aspirin 81 MG tablet Take 81 mg by mouth daily with  breakfast.     . carbamide peroxide (DEBROX) 6.5 % otic solution Place 5 drops into the right ear 2 (two) times daily. 15 mL 1  . citalopram (CELEXA) 40 MG tablet Take 1 tablet (40 mg total) by mouth daily. 90 tablet 1  . polyethylene glycol powder (GLYCOLAX/MIRALAX) powder Take 17 g by mouth 2 (two) times daily as needed. 3350 g 1  . Probiotic Product (ALIGN) 4 MG CAPS Take 1 capsule (4 mg total) by mouth daily. 90 capsule 0  . ranitidine (ZANTAC) 150 MG tablet Take 1 tablet (150 mg total) by mouth at bedtime. 30 tablet 0  . testosterone cypionate (DEPOTESTOSTERONE CYPIONATE) 200 MG/ML injection Inject 1 mL (200 mg total) into the muscle every 14 (fourteen) days. 10 mL 1   No current facility-administered medications for this visit.     Allergies  Allergen Reactions  . Adhesive [Tape] Itching and Rash    Review of Systems: Positive for allergy, sinus trouble, anxiety, cough, depression, fatigue, sleeping problems and swollen lymph nodes. All other systems reviewed and negative except where noted in HPI.    Physical Exam: BP 100/70   Pulse 70   Ht 6\' 1"  (1.854 m)   Wt (!) 442 lb (200.5 kg)   BMI 58.31 kg/m  Constitutional:  Morbidly obese white male in no acute distress. Psychiatric: Normal mood and affect. Behavior is normal. EENT: Pupils equal.  Conjunctivae are normal. No scleral icterus. Neck supple.  Cardiovascular: Normal rate, regular rhythm.  Pulmonary/chest: Effort normal and breath sounds normal. No wheezing, rales or rhonchi. Abdominal: Soft, obese, bowel sounds active throughout. There are no masses palpable. RECTAL: Difficult exam given patient's size but no obvious external or internal lesions. DRE no painful speaking against fissure Extremities: no edema Lymphadenopathy: No cervical adenopathy noted. Neurological: Alert and oriented to person place and time. Skin: Skin is warm and dry. No rashes noted.   ASSESSMENT AND PLAN:  1. [age] yo male with rectal discomfort, progressive fecal leakage (of formed stool) and rectal bleeding. By history it doesn't sound like incontinence from overflow. He was nervous and tense so it was technically difficult to assess sphincter tone but it seemed normal.  -for further evaluation patient will be scheduled for a colonoscopy at the hospital. The risks and benefits of the procedure were discussed and the patient agrees to proceed.  -last CBC several months ago, will check another today  2. Morbid obesity  3. Known B12 def managed by PCP, +intrinsic factor in 2012.   4. Depression, no acute concerns. On treatment  5. Hypogonadism  Willette ClusterPaula Cedric Mcclaine, NP  07/27/2016, 12:13 PM  Cc:  Felix PaciniKuneff, Renee A, DO

## 2016-07-27 NOTE — Patient Instructions (Signed)
Please go to the basement level to have your labs drawn.   You have been scheduled for a colonoscopy. Please follow written instructions given to you at your visit today.   If you use inhalers (even only as needed), please bring them with you on the day of your procedure.

## 2016-07-28 NOTE — Progress Notes (Signed)
Agree with assessment and plan. Case to be done at the hospital given BMI

## 2016-08-04 ENCOUNTER — Ambulatory Visit (INDEPENDENT_AMBULATORY_CARE_PROVIDER_SITE_OTHER): Payer: PRIVATE HEALTH INSURANCE | Admitting: *Deleted

## 2016-08-04 DIAGNOSIS — E538 Deficiency of other specified B group vitamins: Secondary | ICD-10-CM

## 2016-08-04 DIAGNOSIS — E230 Hypopituitarism: Secondary | ICD-10-CM

## 2016-08-04 MED ORDER — TESTOSTERONE CYPIONATE 200 MG/ML IM SOLN
200.0000 mg | Freq: Once | INTRAMUSCULAR | Status: AC
  Administered 2016-08-04: 200 mg via INTRAMUSCULAR

## 2016-08-04 MED ORDER — CYANOCOBALAMIN 1000 MCG/ML IJ SOLN
1000.0000 ug | Freq: Once | INTRAMUSCULAR | Status: AC
  Administered 2016-08-04: 1000 ug via INTRAMUSCULAR

## 2016-08-04 NOTE — Progress Notes (Signed)
Patient presents today for testosterone injection and his first B12 injection. Patient tolerated injections well. Patient to get next injections in 2 weeks as noted in Dr Alan RipperKuneff's orders.

## 2016-08-20 ENCOUNTER — Ambulatory Visit: Payer: PRIVATE HEALTH INSURANCE

## 2016-08-25 ENCOUNTER — Ambulatory Visit (INDEPENDENT_AMBULATORY_CARE_PROVIDER_SITE_OTHER): Payer: PRIVATE HEALTH INSURANCE

## 2016-08-25 DIAGNOSIS — E538 Deficiency of other specified B group vitamins: Secondary | ICD-10-CM | POA: Diagnosis not present

## 2016-08-25 DIAGNOSIS — E230 Hypopituitarism: Secondary | ICD-10-CM

## 2016-08-25 MED ORDER — TESTOSTERONE CYPIONATE 200 MG/ML IM SOLN
200.0000 mg | Freq: Once | INTRAMUSCULAR | Status: AC
  Administered 2016-08-25: 200 mg via INTRAMUSCULAR

## 2016-08-25 MED ORDER — CYANOCOBALAMIN 1000 MCG/ML IJ SOLN
1000.0000 ug | Freq: Once | INTRAMUSCULAR | Status: AC
  Administered 2016-08-25: 1000 ug via INTRAMUSCULAR

## 2016-08-25 NOTE — Progress Notes (Signed)
Patient in for Vitamin B12 and Testosterone injection.  Patient tolerated well with no complaints. Patient to return in two weeks for injections.

## 2016-09-09 ENCOUNTER — Ambulatory Visit: Payer: PRIVATE HEALTH INSURANCE

## 2016-09-10 ENCOUNTER — Emergency Department (HOSPITAL_COMMUNITY)
Admission: EM | Admit: 2016-09-10 | Discharge: 2016-09-10 | Disposition: A | Discharge status: Home / Self Care | Payer: PRIVATE HEALTH INSURANCE | Attending: Emergency Medicine | Admitting: Emergency Medicine

## 2016-09-10 ENCOUNTER — Encounter (HOSPITAL_COMMUNITY): Payer: Self-pay | Admitting: Nurse Practitioner

## 2016-09-10 ENCOUNTER — Emergency Department (HOSPITAL_COMMUNITY): Payer: PRIVATE HEALTH INSURANCE

## 2016-09-10 DIAGNOSIS — E039 Hypothyroidism, unspecified: Secondary | ICD-10-CM | POA: Insufficient documentation

## 2016-09-10 DIAGNOSIS — R072 Precordial pain: Secondary | ICD-10-CM | POA: Diagnosis not present

## 2016-09-10 DIAGNOSIS — Z7982 Long term (current) use of aspirin: Secondary | ICD-10-CM | POA: Insufficient documentation

## 2016-09-10 DIAGNOSIS — F909 Attention-deficit hyperactivity disorder, unspecified type: Secondary | ICD-10-CM | POA: Diagnosis not present

## 2016-09-10 DIAGNOSIS — I10 Essential (primary) hypertension: Secondary | ICD-10-CM | POA: Insufficient documentation

## 2016-09-10 DIAGNOSIS — R079 Chest pain, unspecified: Secondary | ICD-10-CM

## 2016-09-10 DIAGNOSIS — F17200 Nicotine dependence, unspecified, uncomplicated: Secondary | ICD-10-CM | POA: Diagnosis not present

## 2016-09-10 LAB — BASIC METABOLIC PANEL
Anion gap: 8 (ref 5–15)
BUN: 10 mg/dL (ref 6–20)
CALCIUM: 9.2 mg/dL (ref 8.9–10.3)
CHLORIDE: 103 mmol/L (ref 101–111)
CO2: 26 mmol/L (ref 22–32)
CREATININE: 1.09 mg/dL (ref 0.61–1.24)
GFR calc non Af Amer: >60 mL/min (ref >60)
Glucose, Bld: 92 mg/dL (ref 65–99)
Potassium: 4 mmol/L (ref 3.5–5.1)
SODIUM: 137 mmol/L (ref 135–145)

## 2016-09-10 LAB — CBC
HCT: 39.6 % (ref 39.0–52.0)
Hemoglobin: 12.7 g/dL — ABNORMAL LOW (ref 13.0–17.0)
MCH: 27.4 pg (ref 26.0–34.0)
MCHC: 32.1 g/dL (ref 30.0–36.0)
MCV: 85.3 fL (ref 78.0–100.0)
PLATELETS: 314 10*3/uL (ref 150–400)
RBC: 4.64 MIL/uL (ref 4.22–5.81)
RDW: 14.8 % (ref 11.5–15.5)
WBC: 11.5 10*3/uL — AB (ref 4.0–10.5)

## 2016-09-10 LAB — I-STAT TROPONIN, ED
TROPONIN I, POC: 0 ng/mL (ref 0.00–0.08)
TROPONIN I, POC: 0 ng/mL (ref 0.00–0.08)

## 2016-09-10 MED ORDER — KETOROLAC TROMETHAMINE 60 MG/2ML IM SOLN
60.0000 mg | Freq: Once | INTRAMUSCULAR | Status: AC
  Administered 2016-09-10: 60 mg via INTRAMUSCULAR
  Filled 2016-09-10: qty 2

## 2016-09-10 NOTE — ED Triage Notes (Addendum)
Pt presents with c/o CP.  He repots he didn't really feel well all day and had some nausea earlier. The chest pain began this afternoon after exerting himself at work. The pain radiates into his neck, mid back and he has felt SOB.

## 2016-09-10 NOTE — ED Provider Notes (Signed)
[facility]-EMERGENCY DEPT Provider Note   CSN: [id number] Arrival date & time: 09/10/16  1834     History   Chief Complaint Chief Complaint  Patient presents with  . Chest Pain    HPI Bobby [name] is a 41 y.o. male.  Patient is a 41 year old male with history of obesity, hypertension, GERD. He presents with complaints of chest pain. This started at the edge of his workday. He reports sharp pains in the center of his chest that radiated through to the back. The pain is always there, however will wax and wane. The pain is very bad, he feels as though it "takes his breath away". He denies any nausea or diaphoresis. He denies any radiation down his arm or into his jaw. He denies any recent exertional symptoms.   The history is provided by the patient.  Chest Pain   This is a new problem. The current episode started 1 to 2 hours ago. The problem occurs constantly. The problem has been gradually improving. The pain is present in the substernal region. The pain is moderate. The quality of the pain is described as sharp. The pain does not radiate. Pertinent negatives include no diaphoresis, no nausea and no shortness of breath. He has tried nothing for the symptoms.    Past Medical History:  Diagnosis Date  . ADD (attention deficit disorder with hyperactivity)    adult  . Anemia   . Depression with anxiety 04/12/2011  . Fatigue 04/08/2011  . GERD (gastroesophageal reflux disease)   . History of hypotestosteronemia 04/13/2011  . HTN (hypertension) 07/08/2010   Qualifier: Diagnosis of  By: Abner Greenspan MD, Misty Stanley    . Hyperlipidemia   . Hypertension   . Hypothyroid 12/28/2010  . Inclusion cyst 07/08/2010   Qualifier: Diagnosis of  By: Abner Greenspan MD, Misty Stanley    . Lumbar back pain    with radiculopathy  . 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 Obesity    morbid  . OSA (obstructive sleep apnea)    cpap setting of 14  . Paresthesia of skin 04/12/2011  . Paronychia 10/13/2011  . Thyroid disease    Hypothyroid  . Tremor    idiopathic  . Vitamin B 12 deficiency 04/13/2011    Patient Active Problem List   Diagnosis Date Noted  . Incontinence of feces 07/16/2016  . Rectal bleeding 07/16/2016  . Vitamin D deficiency 12/01/2015  . 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 Encounter for general adult medical examination with abnormal findings 12/01/2015  . Lumbar radicular pain 11/25/2014  . Vitamin B 12 deficiency 04/13/2011  . Depression with anxiety 04/12/2011  . Hyperlipidemia 12/28/2010  . Morbid obesity (HCC) 07/08/2010  . ATTENTION DEFICIT HYPERACTIVITY DISORDER, ADULT 07/08/2010  . GERD 07/08/2010  . Obstructive sleep apnea 07/08/2010  . HTN (hypertension) 07/08/2010    Past Surgical History:  Procedure Laterality Date  . CYSTECTOMY  10/2011   neck  . meatal surgery to urinary opening  age 84  . wisdom teeth exactraction  age 87       Home Medications    Prior to Admission medications   Medication Sig Start Date End Date Taking? Authorizing Provider  aspirin 81 MG tablet Take 81 mg by mouth daily with breakfast.     Historical Provider, MD  carbamide peroxide (DEBROX) 6.5 % otic solution Place 5 drops into the right ear 2 (two) times daily. 07/16/16   Renee A Kuneff, DO  citalopram (CELEXA) 40 MG tablet Take 1 tablet (40 mg total) by  mouth daily. 07/16/16   Renee A Kuneff, DO  polyethylene glycol powder (GLYCOLAX/MIRALAX) powder Take 17 g by mouth 2 (two) times daily as needed. 07/16/16   Renee A Kuneff, DO  Probiotic Product (ALIGN) 4 MG CAPS Take 1 capsule (4 mg total) by mouth daily. 07/16/16   Renee A Kuneff, DO  ranitidine (ZANTAC) 150 MG tablet Take 1 tablet (150 mg total) by mouth at bedtime. 02/07/14   Bradd Canary, MD  testosterone cypionate (DEPOTESTOSTERONE CYPIONATE) 200 MG/ML injection Inject 1 mL (200 mg total) into the muscle every 14 (fourteen) days. 07/16/16   Natalia Leatherwood, DO    Family History Family History  Problem Relation Age of Onset  .  Hypertension Mother   . Obesity Mother   . Cardiomyopathy Mother   . Arthritis Father   . Gout Father   . Hyperlipidemia Father   . Hypertension Father   . Cleft palate Father   . Other Father     disassociative fugue  . Obesity Father   . ADD / ADHD Daughter     ADHD  . Coronary artery disease Maternal Grandmother     s/p multiple MI's first one in late 44's  . Other Maternal Grandmother     CHF  . Cancer Maternal Grandmother     breast  . Other Maternal Grandfather     Essential tremors  . Cancer Maternal Grandfather     spinal/ smoker/brain  . Leukemia Paternal Grandmother   . Cancer Paternal Grandmother     leukemia  . Atrial fibrillation Paternal Grandfather 92  . Hypertension Paternal Grandfather   . Other Paternal Doctor, general practice  . Cancer Maternal Uncle     colon    Social History Social History  Substance Use Topics  . Smoking status: Current Some Day Smoker  . Smokeless tobacco: Never Used  . Alcohol use No     Comment: Rare - 1-2x/year     Allergies   Adhesive [tape]   Review of Systems Review of Systems  Constitutional: Negative for diaphoresis.  Respiratory: Negative for shortness of breath.   Cardiovascular: Positive for chest pain.  Gastrointestinal: Negative for nausea.  All other systems reviewed and are negative.    Physical Exam Updated Vital Signs BP (!) 163/73   Pulse 70   Temp 98.4 F (36.9 C) (Oral)   Resp 18   SpO2 100%   Physical Exam  Constitutional: He is oriented to person, place, and time. He appears well-developed and well-nourished. No distress.  HENT:  Head: Normocephalic and atraumatic.  Mouth/Throat: Oropharynx is clear and moist.  Neck: Normal range of motion. Neck supple.  Cardiovascular: Normal rate and regular rhythm.  Exam reveals no friction rub.   No murmur heard. Pulmonary/Chest: Effort normal and breath sounds normal. No respiratory distress. He has no wheezes. He has no rales. He  exhibits tenderness.  There is tenderness to palpation over the upper sternum. This seems to reproduce his symptoms.  Abdominal: Soft. Bowel sounds are normal. He exhibits no distension. There is no tenderness.  Musculoskeletal: Normal range of motion. He exhibits no edema.  Neurological: He is alert and oriented to person, place, and time. Coordination normal.  Skin: Skin is warm and dry. He is not diaphoretic.  Nursing note and vitals reviewed.    ED Treatments / Results  Labs (all labs ordered are listed, but only abnormal results are displayed) Labs Reviewed  CBC - Abnormal; Notable for the  following:       Result Value   WBC 11.5 (*)    Hemoglobin 12.7 (*)    All other components within normal limits  BASIC METABOLIC PANEL  I-STAT TROPOININ, ED    EKG  EKG Interpretation  Date/Time:  Friday September 10 2016 18:39:20 EDT Ventricular Rate:  79 PR Interval:  156 QRS Duration: 100 QT Interval:  364 QTC Calculation: 417 R Axis:   40 Text Interpretation:  Normal sinus rhythm Normal ECG Confirmed by Damarie Schoolfield  MD, Alyene Predmore ([id number]) on 09/10/2016 8:51:49 PM       Radiology Dg Chest 2 View  Result Date: 09/10/2016 CLINICAL DATA:  Reason for exam: CP. Pt c/o of midsternal CP that radiates towards (between) shoulder blades and down left arm. Medical hx: HTN, anemia. EXAM: CHEST  2 VIEW COMPARISON:  11/05/2011 FINDINGS: The heart size and mediastinal contours are within normal limits. Both lungs are clear. No pleural effusion or pneumothorax. The visualized skeletal structures are unremarkable. IMPRESSION: No active cardiopulmonary disease. Electronically Signed   By: Amie Portland M.D.   On: 09/10/2016 19:29    Procedures Procedures (including critical care time)  Medications Ordered in ED Medications  ketorolac (TORADOL) injection 60 mg (not administered)     Initial Impression / Assessment and Plan / ED Course  I have reviewed the triage vital signs and the nursing  notes.  Pertinent labs & imaging results that were available during my care of the patient were reviewed by me and considered in my medical decision making (see chart for details).  Patient with no prior cardiac history presents after an episode of chest pain. He was lifting a 40 pound trailer hitch when his symptoms began. He describes sharp pain in the center of his chest that was worse with palpation and movement. His EKG is normal and troponin 2 has been negative. He was given Toradol with near complete relief of his discomfort.  His symptoms seem musculoskeletal in nature. There is no objective evidence of any cardiac ischemia. He denies any recent exertional symptoms.  I have discussed the disposition with him and his wife. He was offered admission for close observation, however the patient declines. He prefers to go home and follow-up as needed. He will be started on anti-inflammatory medications, advised to rest, and return if his symptoms worsen.  He will also be given the number for the cardiology clinic who he can call should his symptoms not completely improved to discuss stress testing.  Final Clinical Impressions(s) / ED Diagnoses   Final diagnoses:  None    New Prescriptions New Prescriptions   No medications on file     Bobby Lyons, MD 09/10/16 2322

## 2016-09-10 NOTE — Discharge Instructions (Signed)
Ibuprofen 600 mg 3 times daily for the next 5 days.  Rest.  Follow-up with cardiology to discuss a stress test if your symptoms are not improving in the next several days. The contact information for the [facility] has been provided in this discharge summary for you to call and make these arrangements. Return to the emergency department in the meantime if your symptoms worsen or change.

## 2016-09-13 ENCOUNTER — Ambulatory Visit (INDEPENDENT_AMBULATORY_CARE_PROVIDER_SITE_OTHER): Payer: PRIVATE HEALTH INSURANCE

## 2016-09-13 DIAGNOSIS — E538 Deficiency of other specified B group vitamins: Secondary | ICD-10-CM

## 2016-09-13 DIAGNOSIS — E230 Hypopituitarism: Secondary | ICD-10-CM | POA: Diagnosis not present

## 2016-09-13 MED ORDER — CYANOCOBALAMIN 1000 MCG/ML IJ SOLN
1000.0000 ug | Freq: Once | INTRAMUSCULAR | Status: AC
  Administered 2016-09-13: 1000 ug via INTRAMUSCULAR

## 2016-09-13 MED ORDER — TESTOSTERONE CYPIONATE 200 MG/ML IM SOLN
200.0000 mg | Freq: Once | INTRAMUSCULAR | Status: AC
  Administered 2016-09-13: 200 mg via INTRAMUSCULAR

## 2016-09-13 NOTE — Progress Notes (Signed)
Patient presents today for Vitamin B12 injection and Testosterone injection. Injections given with no problems or incidences and patient left with no complaints.

## 2016-09-14 ENCOUNTER — Encounter (HOSPITAL_COMMUNITY): Payer: Self-pay | Admitting: *Deleted

## 2016-09-17 ENCOUNTER — Ambulatory Visit (HOSPITAL_COMMUNITY): Payer: PRIVATE HEALTH INSURANCE | Admitting: Anesthesiology

## 2016-09-17 ENCOUNTER — Encounter (HOSPITAL_COMMUNITY)
Admission: RE | Disposition: A | Discharge status: Home / Self Care | Payer: Self-pay | Source: Ambulatory Visit | Attending: Gastroenterology

## 2016-09-17 ENCOUNTER — Encounter (HOSPITAL_COMMUNITY): Payer: Self-pay

## 2016-09-17 ENCOUNTER — Ambulatory Visit (HOSPITAL_COMMUNITY)
Admission: RE | Admit: 2016-09-17 | Discharge: 2016-09-17 | Disposition: A | Discharge status: Home / Self Care | Payer: PRIVATE HEALTH INSURANCE | Source: Ambulatory Visit | Attending: Gastroenterology | Admitting: Gastroenterology

## 2016-09-17 DIAGNOSIS — K5730 Diverticulosis of large intestine without perforation or abscess without bleeding: Secondary | ICD-10-CM | POA: Diagnosis not present

## 2016-09-17 DIAGNOSIS — K625 Hemorrhage of anus and rectum: Secondary | ICD-10-CM

## 2016-09-17 DIAGNOSIS — Z79899 Other long term (current) drug therapy: Secondary | ICD-10-CM | POA: Insufficient documentation

## 2016-09-17 DIAGNOSIS — Z8614 Personal history of Methicillin resistant Staphylococcus aureus infection: Secondary | ICD-10-CM | POA: Insufficient documentation

## 2016-09-17 DIAGNOSIS — K648 Other hemorrhoids: Secondary | ICD-10-CM | POA: Insufficient documentation

## 2016-09-17 DIAGNOSIS — Z6841 Body Mass Index (BMI) 40.0 and over, adult: Secondary | ICD-10-CM | POA: Insufficient documentation

## 2016-09-17 DIAGNOSIS — G4733 Obstructive sleep apnea (adult) (pediatric): Secondary | ICD-10-CM | POA: Insufficient documentation

## 2016-09-17 DIAGNOSIS — F17200 Nicotine dependence, unspecified, uncomplicated: Secondary | ICD-10-CM | POA: Insufficient documentation

## 2016-09-17 DIAGNOSIS — R159 Full incontinence of feces: Secondary | ICD-10-CM | POA: Insufficient documentation

## 2016-09-17 DIAGNOSIS — K921 Melena: Secondary | ICD-10-CM | POA: Diagnosis not present

## 2016-09-17 HISTORY — DX: Major depressive disorder, single episode, unspecified: F32.9

## 2016-09-17 HISTORY — DX: Full incontinence of feces: R15.9

## 2016-09-17 HISTORY — DX: Anxiety disorder, unspecified: F41.9

## 2016-09-17 HISTORY — PX: COLONOSCOPY WITH PROPOFOL: SHX5780

## 2016-09-17 SURGERY — COLONOSCOPY WITH PROPOFOL
Anesthesia: Monitor Anesthesia Care

## 2016-09-17 MED ORDER — PROPOFOL 10 MG/ML IV BOLUS
INTRAVENOUS | Status: AC
  Filled 2016-09-17: qty 40

## 2016-09-17 MED ORDER — PROPOFOL 10 MG/ML IV BOLUS
INTRAVENOUS | Status: AC
  Filled 2016-09-17: qty 20

## 2016-09-17 MED ORDER — LACTATED RINGERS IV SOLN
INTRAVENOUS | Status: DC
  Administered 2016-09-17: 1000 mL via INTRAVENOUS

## 2016-09-17 MED ORDER — PROPOFOL 10 MG/ML IV BOLUS
INTRAVENOUS | Status: DC | PRN
  Administered 2016-09-17 (×9): 20 mg via INTRAVENOUS
  Administered 2016-09-17: 40 mg via INTRAVENOUS
  Administered 2016-09-17 (×8): 20 mg via INTRAVENOUS
  Administered 2016-09-17: 40 mg via INTRAVENOUS
  Administered 2016-09-17: 20 mg via INTRAVENOUS

## 2016-09-17 MED ORDER — SODIUM CHLORIDE 0.9 % IV SOLN: INTRAVENOUS | Status: DC

## 2016-09-17 SURGICAL SUPPLY — 22 items

## 2016-09-17 NOTE — H&P (Signed)
HPI :  41 y/o male with morbid obesity, with symptoms of rectal bleeding and fecal leakage. No prior colonoscopy. Here for colonoscopy to further evaluate. Last seen in clinic in February, no change in history since that time. Mild anemia.   Past Medical History:  Diagnosis Date  . ADD (attention deficit disorder with hyperactivity)    adult  . Anal sphincter incontinence   . Anxiety   . Depression   . Depression with anxiety 04/12/2011  . Fatigue 04/08/2011  . GERD (gastroesophageal reflux disease)   . History of hypotestosteronemia 04/13/2011  . Hyperlipidemia   . Inclusion cyst 07/08/2010   Qualifier: Diagnosis of  By: Abner Greenspan MD, Misty Stanley    . Lumbar back pain    with radiculopathy  . 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 Obesity    morbid  . OSA (obstructive sleep apnea)    cpap setting of 14  . Paresthesia of skin 04/12/2011  . Paronychia 10/13/2011  . Thyroid disease    Hypothyroid  . Tremor    idiopathic  . Vitamin B 12 deficiency 04/13/2011     Past Surgical History:  Procedure Laterality Date  . CYSTECTOMY  10/2011   neck  . meatal surgery to urinary opening  age 38  . wisdom teeth exactraction  age 53   Family History  Problem Relation Age of Onset  . Hypertension Mother   . Obesity Mother   . Cardiomyopathy Mother   . Arthritis Father   . Gout Father   . Hyperlipidemia Father   . Hypertension Father   . Cleft palate Father   . Other Father     disassociative fugue  . Obesity Father   . ADD / ADHD Daughter     ADHD  . Coronary artery disease Maternal Grandmother     s/p multiple MI's first one in late 25's  . Other Maternal Grandmother     CHF  . Cancer Maternal Grandmother     breast  . Other Maternal Grandfather     Essential tremors  . Cancer Maternal Grandfather     spinal/ smoker/brain  . Leukemia Paternal Grandmother   . Cancer Paternal Grandmother     leukemia  . Atrial fibrillation Paternal Grandfather 45  . Hypertension Paternal  Grandfather   . Other Paternal Doctor, general practice  . Cancer Maternal Uncle     colon   Social History  Substance Use Topics  . Smoking status: Current Some Day Smoker  . Smokeless tobacco: Never Used  . Alcohol use No     Comment: Rare - 1-2x/year   Current Facility-Administered Medications  Medication Dose Route Frequency Provider Last Rate Last Dose  . 0.9 %  sodium chloride infusion   Intravenous Continuous Meredith Pel, NP      . lactated ringers infusion   Intravenous Continuous Ruffin Frederick, MD       Allergies  Allergen Reactions  . Adhesive [Tape] Itching and Rash     Review of Systems: All systems reviewed and negative except where noted in HPI.    Dg Chest 2 View  Result Date: 09/10/2016 CLINICAL DATA:  Reason for exam: CP. Pt c/o of midsternal CP that radiates towards (between) shoulder blades and down left arm. Medical hx: HTN, anemia. EXAM: CHEST  2 VIEW COMPARISON:  11/05/2011 FINDINGS: The heart size and mediastinal contours are within normal limits. Both lungs are clear. No pleural effusion or pneumothorax. The visualized skeletal structures are unremarkable.  IMPRESSION: No active cardiopulmonary disease. Electronically Signed   By: Amie Portland M.D.   On: 09/10/2016 19:29   Lab Results  Component Value Date   WBC 11.5 (H) 09/10/2016   HGB 12.7 (L) 09/10/2016   HCT 39.6 09/10/2016   MCV 85.3 09/10/2016   PLT 314 09/10/2016    Lab Results  Component Value Date   FERRITIN 74.5 04/08/2011   Lab Results  Component Value Date   ALT 16 11/27/2015   AST 15 11/27/2015   ALKPHOS 59 11/27/2015   BILITOT 0.5 11/27/2015    Lab Results  Component Value Date   CREATININE 1.09 09/10/2016   BUN 10 09/10/2016   NA 137 09/10/2016   K 4.0 09/10/2016   CL 103 09/10/2016   CO2 26 09/10/2016      Physical Exam: BP 129/79   Pulse 70   Temp 98.1 F (36.7 C) (Oral)   Resp (!) 21   Ht  (1.854 m)   Wt (!) 442 lb (200.5  kg)   SpO2 98%   BMI 58.31 kg/m  Constitutional: Pleasant,well-developed, male in no acute distress.  Cardiovascular: Normal rate, regular rhythm.  Pulmonary/chest: Effort normal and breath sounds normal. No wheezing, rales or rhonchi. Abdominal: Soft, nontender, obese abdomen. There are no masses palpable. No hepatomegaly. Extremities: no edema   ASSESSMENT AND PLAN: 41 y/o male with morbid obesity, presenting with intermittent rectal bleeding and fecal leakage. Here for colonoscopy to further evaluate. Discussed risks / benefits and he wished to proceed.   Ileene Patrick, MD [facility] Gastroenterology Pager [phone]

## 2016-09-17 NOTE — Transfer of Care (Signed)
Immediate Anesthesia Transfer of Care Note  Patient: Bobby [name]  Procedure(s) Performed: Procedure(s): COLONOSCOPY WITH PROPOFOL (N/A)  Patient Location: PACU  Anesthesia Type:MAC  Level of Consciousness: sedated  Airway & Oxygen Therapy: Patient Spontanous Breathing and Patient connected to face mask oxygen  Post-op Assessment: Report given to RN and Post -op Vital signs reviewed and stable  Post vital signs: Reviewed and stable  Last Vitals:  Vitals:   09/17/16 0814  BP: 129/79  Pulse: 70  Resp: (!) 21  Temp: 36.7 C    Last Pain:  Vitals:   09/17/16 0814  TempSrc: Oral         Complications: No apparent anesthesia complications

## 2016-09-17 NOTE — Anesthesia Postprocedure Evaluation (Signed)
Anesthesia Post Note  Patient: Bobby [name]  Procedure(s) Performed: Procedure(s) (LRB): COLONOSCOPY WITH PROPOFOL (N/A)  Patient location during evaluation: PACU Anesthesia Type: MAC Level of consciousness: awake and alert Pain management: pain level controlled Vital Signs Assessment: post-procedure vital signs reviewed and stable Respiratory status: spontaneous breathing, nonlabored ventilation, respiratory function stable and patient connected to nasal cannula oxygen Cardiovascular status: stable and blood pressure returned to baseline Anesthetic complications: no       Last Vitals:  Vitals:   09/17/16 0924 09/17/16 0930  BP: 115/65 122/70  Pulse: 66 63  Resp: 17 18  Temp:  36.5 C    Last Pain:  Vitals:   09/17/16 0930  TempSrc: Oral                 [place] EDWARD

## 2016-09-17 NOTE — Interval H&P Note (Signed)
History and Physical Interval Note:  09/17/2016 8:28 AM  Bobby [name]  has presented today for surgery, with the diagnosis of Rectal bleeding,Fecal incontinence.   The various methods of treatment have been discussed with the patient and family. After consideration of risks, benefits and other options for treatment, the patient has consented to  Procedure(s): COLONOSCOPY WITH PROPOFOL (N/A) as a surgical intervention .  The patient's history has been reviewed, patient examined, no change in status, stable for surgery.  I have reviewed the patient's chart and labs.  Questions were answered to the patient's satisfaction.     Reeves Forth Jaala Bohle

## 2016-09-17 NOTE — Op Note (Signed)
[name] Patient Name: Bobby [name] Procedure Date: 09/17/2016 MRN: [id number] Attending MD: Carlota Raspberry. Maylani Embree MD, MD Date of Birth: [birth year] CSN: [id number] Age: 41 Admit Type: Outpatient Procedure:                Colonoscopy Indications:              Hematochezia, symptoms of fecal leakage Providers:                Remo Lipps P. Devian Bartolomei MD, MD, Laverta Baltimore RN,                            RN, Alfonso Patten, Technician, [place] CRNA,                            CRNA Referring MD:              Medicines:                Monitored Anesthesia Care Complications:            No immediate complications. Estimated blood loss:                            None. Estimated Blood Loss:     Estimated blood loss: none. Procedure:                Pre-Anesthesia Assessment:                           - Prior to the procedure, a History and Physical                            was performed, and patient medications and                            allergies were reviewed. The patient's tolerance of                            previous anesthesia was also reviewed. The risks                            and benefits of the procedure and the sedation                            options and risks were discussed with the patient.                            All questions were answered, and informed consent                            was obtained. Prior Anticoagulants: The patient has                            taken aspirin, last dose was 1 day prior to  procedure. ASA Grade Assessment: III - A patient                            with severe systemic disease. After reviewing the                            risks and benefits, the patient was deemed in                            satisfactory condition to undergo the procedure.                           After obtaining informed consent, the colonoscope                            was passed under direct vision.  Throughout the                            procedure, the patient's blood pressure, pulse, and                            oxygen saturations were monitored continuously. The                            EC-[id number] ([id number]) scope was introduced through                            the anus and advanced to the the terminal ileum,                            with identification of the appendiceal orifice and                            IC valve. The colonoscopy was performed without                            difficulty. The patient tolerated the procedure                            well. The quality of the bowel preparation was                            good. The terminal ileum, ileocecal valve,                            appendiceal orifice, and rectum were photographed. Scope In: 8:43:15 AM Scope Out: 8:59:04 AM Scope Withdrawal Time: 0 hours 12 minutes 17 seconds  Total Procedure Duration: 0 hours 15 minutes 49 seconds  Findings:      The perianal and digital rectal examinations were normal.      The terminal ileum appeared normal.      Multiple medium-mouthed diverticula were found in the left colon.      Internal hemorrhoids were found during retroflexion.      The exam was  otherwise without abnormality. No polyps Impression:               - The examined portion of the ileum was normal.                           - Diverticulosis in the left colon.                           - Internal hemorrhoids.                           - The examination was otherwise normal. Moderate Sedation:      No moderate sedation, case performed with MAC Recommendation:           - Patient has a contact number available for                            emergencies. The signs and symptoms of potential                            delayed complications were discussed with the                            patient. Return to normal activities tomorrow.                            Written discharge instructions were provided  to the                            patient.                           - Resume previous diet.                           - Continue present medications.                           - Recommend daily fiber supplement (Citrucel) for                            symptoms of hemorrhoids and leakage                           - If symptoms persist despite this regimen,                            recommend trial of hemorrhoid banding. Please                            contact the clinic to schedule if you wish to                            pursue this.                           -  Repeat colonoscopy in 10 years for screening                            purposes. Procedure Code(s):        --- Professional ---                           [phone], Colonoscopy, flexible; diagnostic, including                            collection of specimen(s) by brushing or washing,                            when performed (separate procedure) Diagnosis Code(s):        --- Professional ---                           K64.8, Other hemorrhoids                           K92.1, Melena (includes Hematochezia)                           K57.30, Diverticulosis of large intestine without                            perforation or abscess without bleeding CPT copyright 2016 [employer]. All rights reserved. The codes documented in this report are preliminary and upon coder review may  be revised to meet current compliance requirements. Remo Lipps P. Horrace Hanak MD, MD 09/17/2016 9:05:19 AM This report has been signed electronically. Number of Addenda: 0

## 2016-09-17 NOTE — Discharge Instructions (Signed)
YOU HAD AN ENDOSCOPIC PROCEDURE TODAY: Refer to the procedure report and other information in the discharge instructions given to you for any specific questions about what was found during the examination. If this information does not answer your questions, please call [place] office at [phone] to clarify.   YOU SHOULD EXPECT: Some feelings of bloating in the abdomen. Passage of more gas than usual. Walking can help get rid of the air that was put into your GI tract during the procedure and reduce the bloating. If you had a lower endoscopy (such as a colonoscopy or flexible sigmoidoscopy) you may notice spotting of blood in your stool or on the toilet paper. Some abdominal soreness may be present for a day or two, also.  DIET: Your first meal following the procedure should be a light meal and then it is ok to progress to your normal diet. A half-sandwich or bowl of soup is an example of a good first meal. Heavy or fried foods are harder to digest and may make you feel nauseous or bloated. Drink plenty of fluids but you should avoid alcoholic beverages for 24 hours. If you had a esophageal dilation, please see attached instructions for diet.    ACTIVITY: Your care partner should take you home directly after the procedure. You should plan to take it easy, moving slowly for the rest of the day. You can resume normal activity the day after the procedure however YOU SHOULD NOT DRIVE, use power tools, machinery or perform tasks that involve climbing or major physical exertion for 24 hours (because of the sedation medicines used during the test).   SYMPTOMS TO REPORT IMMEDIATELY: A gastroenterologist can be reached at any hour. Please call [phone]  for any of the following symptoms:  Following lower endoscopy (colonoscopy, flexible sigmoidoscopy) Excessive amounts of blood in the stool  Significant tenderness, worsening of abdominal pains  Swelling of the abdomen that is new, acute  Fever of 100 or  higher  Following upper endoscopy (EGD, EUS, ERCP, esophageal dilation) Vomiting of blood or coffee ground material  New, significant abdominal pain  New, significant chest pain or pain under the shoulder blades  Painful or persistently difficult swallowing  New shortness of breath  Black, tarry-looking or red, bloody stools  FOLLOW UP:  If any biopsies were taken you will be contacted by phone or by letter within the next 1-3 weeks. Call [phone]  if you have not heard about the biopsies in 3 weeks.  Please also call with any specific questions about appointments or follow up tests.YOU HAD AN ENDOSCOPIC PROCEDURE TODAY: Refer to the procedure report and other information in the discharge instructions given to you for any specific questions about what was found during the examination. If this information does not answer your questions, please call [place] office at [phone] to clarify.   YOU SHOULD EXPECT: Some feelings of bloating in the abdomen. Passage of more gas than usual. Walking can help get rid of the air that was put into your GI tract during the procedure and reduce the bloating. If you had a lower endoscopy (such as a colonoscopy or flexible sigmoidoscopy) you may notice spotting of blood in your stool or on the toilet paper. Some abdominal soreness may be present for a day or two, also.  DIET: Your first meal following the procedure should be a light meal and then it is ok to progress to your normal diet. A half-sandwich or bowl of soup is an example of a   good first meal. Heavy or fried foods are harder to digest and may make you feel nauseous or bloated. Drink plenty of fluids but you should avoid alcoholic beverages for 24 hours. If you had a esophageal dilation, please see attached instructions for diet.    ACTIVITY: Your care partner should take you home directly after the procedure. You should plan to take it easy, moving slowly for the rest of the day. You can resume  normal activity the day after the procedure however YOU SHOULD NOT DRIVE, use power tools, machinery or perform tasks that involve climbing or major physical exertion for 24 hours (because of the sedation medicines used during the test).   SYMPTOMS TO REPORT IMMEDIATELY: A gastroenterologist can be reached at any hour. Please call [phone]  for any of the following symptoms:  Following lower endoscopy (colonoscopy, flexible sigmoidoscopy) Excessive amounts of blood in the stool  Significant tenderness, worsening of abdominal pains  Swelling of the abdomen that is new, acute  Fever of 100 or higher  Following upper endoscopy (EGD, EUS, ERCP, esophageal dilation) Vomiting of blood or coffee ground material  New, significant abdominal pain  New, significant chest pain or pain under the shoulder blades  Painful or persistently difficult swallowing  New shortness of breath  Black, tarry-looking or red, bloody stools  FOLLOW UP:  If any biopsies were taken you will be contacted by phone or by letter within the next 1-3 weeks. Call [phone]  if you have not heard about the biopsies in 3 weeks.  Please also call with any specific questions about appointments or follow up tests. 

## 2016-09-17 NOTE — Anesthesia Preprocedure Evaluation (Signed)
Anesthesia Evaluation  Patient identified by MRN, date of birth, ID band Patient awake    Reviewed: Allergy & Precautions, H&P , Patient's Chart, lab work & pertinent test results  Airway Mallampati: II  TM Distance: >3 FB Neck ROM: full    Dental no notable dental hx.    Pulmonary Current Smoker,    Pulmonary exam normal breath sounds clear to auscultation       Cardiovascular Exercise Tolerance: Good  Rhythm:regular Rate:Normal     Neuro/Psych    GI/Hepatic   Endo/Other  Morbid obesity  Renal/GU      Musculoskeletal   Abdominal   Peds  Hematology   Anesthesia Other Findings   Reproductive/Obstetrics                             Anesthesia Physical Anesthesia Plan  ASA: III  Anesthesia Plan: MAC   Post-op Pain Management:    Induction: Intravenous  Airway Management Planned: Mask and Natural Airway  Additional Equipment:   Intra-op Plan:   Post-operative Plan:   Informed Consent: I have reviewed the patients History and Physical, chart, labs and discussed the procedure including the risks, benefits and alternatives for the proposed anesthesia with the patient or authorized representative who has indicated his/her understanding and acceptance.     Plan Discussed with:   Anesthesia Plan Comments:         Anesthesia Quick Evaluation

## 2016-09-21 ENCOUNTER — Encounter (HOSPITAL_COMMUNITY): Payer: Self-pay | Admitting: Gastroenterology

## 2016-09-27 ENCOUNTER — Ambulatory Visit: Payer: PRIVATE HEALTH INSURANCE

## 2016-09-29 ENCOUNTER — Ambulatory Visit (INDEPENDENT_AMBULATORY_CARE_PROVIDER_SITE_OTHER): Payer: PRIVATE HEALTH INSURANCE

## 2016-09-29 DIAGNOSIS — E538 Deficiency of other specified B group vitamins: Secondary | ICD-10-CM

## 2016-09-29 DIAGNOSIS — E230 Hypopituitarism: Secondary | ICD-10-CM

## 2016-09-29 MED ORDER — TESTOSTERONE CYPIONATE 200 MG/ML IM SOLN
200.0000 mg | Freq: Once | INTRAMUSCULAR | Status: AC
  Administered 2016-09-29: 200 mg via INTRAMUSCULAR

## 2016-09-29 MED ORDER — CYANOCOBALAMIN 1000 MCG/ML IJ SOLN
1000.0000 ug | Freq: Once | INTRAMUSCULAR | Status: AC
  Administered 2016-09-29: 1000 ug via INTRAMUSCULAR

## 2016-09-29 NOTE — Progress Notes (Signed)
Patient presents today for Vit B12 injection and Testosterone injection. Patient tolerated procedure well and with out incidence, left with no complaints.

## 2016-10-14 ENCOUNTER — Ambulatory Visit: Payer: PRIVATE HEALTH INSURANCE | Admitting: Family Medicine

## 2016-10-18 ENCOUNTER — Ambulatory Visit (INDEPENDENT_AMBULATORY_CARE_PROVIDER_SITE_OTHER): Payer: PRIVATE HEALTH INSURANCE

## 2016-10-18 DIAGNOSIS — E538 Deficiency of other specified B group vitamins: Secondary | ICD-10-CM | POA: Diagnosis not present

## 2016-10-18 DIAGNOSIS — E230 Hypopituitarism: Secondary | ICD-10-CM | POA: Diagnosis not present

## 2016-10-18 MED ORDER — TESTOSTERONE CYPIONATE 200 MG/ML IM SOLN
200.0000 mg | Freq: Once | INTRAMUSCULAR | Status: AC
  Administered 2016-10-18: 200 mg via INTRAMUSCULAR

## 2016-10-18 MED ORDER — CYANOCOBALAMIN 1000 MCG/ML IJ SOLN
1000.0000 ug | Freq: Once | INTRAMUSCULAR | Status: AC
  Administered 2016-10-18: 1000 ug via INTRAMUSCULAR

## 2016-10-18 NOTE — Progress Notes (Signed)
Patient presents today for Vitamin B12 injection and Testosterone injection. Injections given with no incidence or problems and left with no complaints.

## 2016-10-26 ENCOUNTER — Other Ambulatory Visit: Payer: PRIVATE HEALTH INSURANCE

## 2016-10-28 ENCOUNTER — Other Ambulatory Visit (INDEPENDENT_AMBULATORY_CARE_PROVIDER_SITE_OTHER): Payer: PRIVATE HEALTH INSURANCE

## 2016-10-28 ENCOUNTER — Encounter: Payer: Self-pay | Admitting: Family Medicine

## 2016-10-28 DIAGNOSIS — E230 Hypopituitarism: Secondary | ICD-10-CM

## 2016-10-28 DIAGNOSIS — E538 Deficiency of other specified B group vitamins: Secondary | ICD-10-CM

## 2016-10-28 LAB — VITAMIN B12: Vitamin B-12: 398 pg/mL (ref 211–911)

## 2016-10-28 LAB — TESTOSTERONE: TESTOSTERONE: 305.57 ng/dL (ref 300.00–890.00)

## 2016-11-04 ENCOUNTER — Ambulatory Visit (INDEPENDENT_AMBULATORY_CARE_PROVIDER_SITE_OTHER): Payer: PRIVATE HEALTH INSURANCE | Admitting: Family Medicine

## 2016-11-04 ENCOUNTER — Encounter: Payer: Self-pay | Admitting: Family Medicine

## 2016-11-04 VITALS — BP 142/90 | HR 69 | Temp 98.4°F | Resp 20 | Ht 73.0 in | Wt >= 6400 oz

## 2016-11-04 DIAGNOSIS — E230 Hypopituitarism: Secondary | ICD-10-CM

## 2016-11-04 DIAGNOSIS — E538 Deficiency of other specified B group vitamins: Secondary | ICD-10-CM

## 2016-11-04 DIAGNOSIS — F418 Other specified anxiety disorders: Secondary | ICD-10-CM | POA: Diagnosis not present

## 2016-11-04 DIAGNOSIS — Z79890 Hormone replacement therapy (postmenopausal): Secondary | ICD-10-CM | POA: Diagnosis not present

## 2016-11-04 MED ORDER — "SYRINGE/NEEDLE (DISP) 25G X 1"" 3 ML MISC": 1.0000 | 3 refills | Status: DC

## 2016-11-04 MED ORDER — TESTOSTERONE CYPIONATE 200 MG/ML IM SOLN: 250.0000 mg | INTRAMUSCULAR | 1 refills | Status: DC

## 2016-11-04 MED ORDER — CYANOCOBALAMIN 1000 MCG/ML IJ SOLN: 1000.0000 ug | INTRAMUSCULAR | 2 refills | Status: DC

## 2016-11-04 MED ORDER — CYANOCOBALAMIN 1000 MCG/ML IJ SOLN
1000.0000 ug | Freq: Once | INTRAMUSCULAR | Status: AC
  Administered 2016-11-04: 1000 ug via INTRAMUSCULAR

## 2016-11-04 MED ORDER — TESTOSTERONE CYPIONATE 200 MG/ML IM SOLN
200.0000 mg | Freq: Once | INTRAMUSCULAR | Status: AC
  Administered 2016-11-04: 200 mg via INTRAMUSCULAR

## 2016-11-04 MED ORDER — BUPROPION HCL ER (XL) 150 MG PO TB24: 150.0000 mg | ORAL_TABLET | Freq: Every day | ORAL | 2 refills | Status: DC

## 2016-11-04 NOTE — Patient Instructions (Signed)
Start Wellbutrin daily.  Injections for b12 weekly, will send in script for you to do at home.  Injections for testosterone increase to 1.25 mL every other week.    Retest all in 3 months, with pre labs 3-4 days prior.    Please help us help you:  We are honored [facility] have [facility] your [facility]. Below you will find basic instructions that you may need to access in the future. Please help us help you by reading the instructions, which cover many of the frequent questions we experience.   Prescription refills and request:  -In order to allow more efficient response time, please call your pharmacy for all refills. They will forward the request electronically to us. This allows for the quickest possible response. Request left on a nurse line can take longer to refill, since these are checked as time allows between office patients and other phone calls.  - refill request can take up to 3-5 working days to complete.  - If request is sent electronically and request is appropiate, it is usually completed in 1-2 business days.  - all patients will need to be seen routinely for all chronic medical conditions requiring prescription medications (see follow-up below). If you are overdue for follow up on your condition, you will be asked to make an appointment and we will call in enough medication to cover you until your appointment (up to 30 days).  - all controlled substances will require a face to face visit to request/refill.  - if you desire your prescriptions to go through a new pharmacy, and have an active script at original pharmacy, you will need to call your pharmacy and have scripts transferred to new pharmacy. This is completed between the pharmacy locations and not by your provider.    Results: If any images or labs were ordered, it can take up to 1 week to get results depending on the test ordered and the lab/facility running and resulting the test. - Normal or stable  results, which do not need further discussion, may be released to your mychart immediately with attached note to you. A call may not be generated for normal results. Please make certain to sign up for mychart. If you have questions on how to activate your mychart you can call the front office.  - If your results need further discussion, our office will attempt to contact you via phone, and if unable to reach you after 2 attempts, we will release your abnormal result to your mychart with instructions.  - All results will be automatically released in mychart after 1 week.  - Your provider will provide you with explanation and instruction on all relevant material in your results. Please keep in mind, results and labs may appear confusing or abnormal to the untrained eye, but it does not mean they are actually abnormal for you personally. If you have any questions about your results that are not covered, or you desire more detailed explanation than what was provided, you should make an appointment with your provider to do so.   Our office handles many outgoing and incoming calls daily. If we have not contacted you within 1 week about your results, please check your mychart to see if there is a message first and if not, then contact our office.  In helping with this matter, you help decrease call volume, and therefore allow us to be able to respond to patients needs more efficiently.   Acute  office visits (sick visit):  An acute visit is intended for a new problem and are scheduled in shorter time slots to allow schedule openings for patients with new problems. This is the appropriate visit to discuss a new problem. In order to provide you with excellent quality medical care with proper time for you to explain your problem, have an exam and receive treatment with instructions, these appointments should be limited to one new problem per visit. If you experience a new problem, in which you desire to be addressed,  please make an acute office visit, we save openings on the schedule to accommodate you. Please do not save your new problem for any other type of visit, let us take care of it properly and quickly for you.   Follow up visits:  Depending on your condition(s) your provider will need to see you routinely in order to provide you with quality care and prescribe medication(s). Most chronic conditions (Example: hypertension, Diabetes, depression/anxiety... etc), require visits a couple times a year. Your provider will instruct you on proper follow up for your personal medical conditions and history. Please make certain to make follow up appointments for your condition as instructed. Failing to do so could result in lapse in your medication treatment/refills. If you request a refill, and are overdue to be seen on a condition, we will always provide you with a 30 day script (once) to allow you time to schedule.    Medicare wellness (well visit): - we have a wonderful Nurse Selena Batten), that will meet with you and provide you will yearly medicare wellness visits. These visits should occur yearly (can not be scheduled less than 1 calendar year apart) and cover preventive health, immunizations, advance directives and screenings you are entitled to yearly through your medicare benefits. Do not miss out on your entitled benefits, this is when medicare will pay for these benefits to be ordered for you.  These are strongly encouraged by your provider and is the appropriate type of visit to make certain you are up to date with all preventive health benefits. If you have not had your medicare wellness exam in the last 12 months, please make certain to schedule one by calling the office and schedule your medicare wellness with Selena Batten as soon as possible.   Yearly physical (well visit):  - Adults are recommended to be seen yearly for physicals. Check with your insurance and date of your last physical, most insurances require one  calendar year between physicals. Physicals include all preventive health topics, screenings, medical exam and labs that are appropriate for gender/age and history. You may have fasting labs needed at this visit. This is a well visit (not a sick visit), new problems should not be covered during this visit (see acute visit).  - Pediatric patients are seen more frequently when they are younger. Your provider will advise you on well child visit timing that is appropriate for your their age. - This is not a medicare wellness visit. Medicare wellness exams do not have an exam portion to the visit. Some medicare companies allow for a physical, some do not allow a yearly physical. If your medicare allows a yearly physical you can schedule the medicare wellness with our nurse Selena Batten and have your physical with your provider after, on the same day. Please check with insurance for your full benefits.   Late Policy/No Shows:  - all new patients should arrive 15-30 minutes earlier than appointment to allow [place] time  to  obtain all personal demographics,  insurance information and for you to complete office paperwork. - All established patients should arrive 10-15 minutes earlier than appointment time to update all information and be checked in .  - In our best efforts to run on time, if you are late for your appointment you will be asked to either reschedule or if able, we will work you back into the schedule. There will be a wait time to work you back in the schedule,  depending on availability.  - If you are unable to make it to your appointment as scheduled, please call 24 hours ahead of time to allow [place] to fill the time slot with someone else who needs to be seen. If you do not cancel your appointment ahead of time, you may be charged a no show fee.

## 2016-11-04 NOTE — Progress Notes (Signed)
Patient ID: Bobby [name], male   DOB: [birth year], 41 y.o.   MRN: [id number]      Patient ID: Bobby [name], male  DOB: [birth year], 41 y.o.   MRN: [id number]  Subjective:  Bobby [name] is a 41 y.o.  male present for follow up depression/anxiety.  Hypogonadotropic hypogonadism/b12 deficiency: Patient has now been consistent with his injections every 2 weeks of both B-12 1000 g and his testosterone 200 mg/1 mL every 2 weeks. He reports a gradual increase in his energy and feeling better, but he does not feel that it is a spectacular difference at this point. He reports last time he was on the injections, it was very sudden and abrupt change, and he has not felt that this time. Reviewed labs with him today, his testosterone has increased from 155 prior to start of injections to 305. His B-12 only increased very minimally from 378 to 398.  Prior note: pt has not started his injections again. He states he switched over insurances and wanted to wait it his benefits were settled before starting injections. He also did not start the B12 injections for the same reasons. He states he did start a daily multivitamin.  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   Depression/anxiety:  Patient reports today that he does not feel that his anxiety is as controlled as prior. He would like to increase his dose of Celexa. He feels like he is more snappy and irritable. He feels he is having trouble focusing. He has not been on other medications in the past for this condition. Prior note: Pt states he is doing well on the celexa and certainly noticed a difference when he ran out if his medication for a few days, secondary to the phramacy needing to order it.    Depression screen [facility] 2/9 12/01/2015 12/01/2015  Decreased Interest 1 0  Down, Depressed, Hopeless 2 0  PHQ - 2 Score 3 0  Altered sleeping 0 -  Tired, decreased energy 3 -  Change in appetite 2 -  Feeling bad or failure about yourself  2 -  Trouble concentrating 0 -    Moving slowly or fidgety/restless 2 -  Suicidal thoughts 0 -  PHQ-9 Score 12 -  Difficult doing work/chores Somewhat difficult -    GAD 7 : Generalized Anxiety Score 12/01/2015  Nervous, Anxious, on Edge 3  Control/stop worrying 2  Worry too much - different things 3  Trouble relaxing 3  Restless 3  Easily annoyed or irritable 3  Afraid - awful might happen 3  Total GAD 7 Score 20  Anxiety Difficulty Very difficult    Recent Results (from the past 2160 hour(s))  Basic metabolic panel     Status: None   Collection Time: 09/10/16  7:03 PM  Result Value Ref Range   Sodium 137 135 - 145 mmol/L   Potassium 4.0 3.5 - 5.1 mmol/L   Chloride 103 101 - 111 mmol/L   CO2 26 22 - 32 mmol/L   Glucose, Bld 92 65 - 99 mg/dL   BUN 10 6 - 20 mg/dL   Creatinine, Ser 1.09 0.61 - 1.24 mg/dL   Calcium 9.2 8.9 - 10.3 mg/dL   GFR calc non Af Amer >60 >60 mL/min   GFR calc Af Amer >60 >60 mL/min    Comment: (NOTE) The eGFR has been calculated using the CKD EPI equation. This calculation has not been validated in all clinical situations. eGFR's persistently <60 mL/min signify possible Chronic  Kidney Disease.    Anion gap 8 5 - 15  CBC     Status: Abnormal   Collection Time: 09/10/16  7:03 PM  Result Value Ref Range   WBC 11.5 (H) 4.0 - 10.5 K/uL   RBC 4.64 4.22 - 5.81 MIL/uL   Hemoglobin 12.7 (L) 13.0 - 17.0 g/dL   HCT 39.6 39.0 - 52.0 %   MCV 85.3 78.0 - 100.0 fL   MCH 27.4 26.0 - 34.0 pg   MCHC 32.1 30.0 - 36.0 g/dL   RDW 14.8 11.5 - 15.5 %   Platelets 314 150 - 400 K/uL  I-stat troponin, ED     Status: None   Collection Time: 09/10/16  7:29 PM  Result Value Ref Range   Troponin i, poc 0.00 0.00 - 0.08 ng/mL   Comment 3            Comment: Due to the release kinetics of cTnI, a negative result within the first hours of the onset of symptoms does not rule out myocardial infarction with certainty. If myocardial infarction is still suspected, repeat the test at appropriate  intervals.   I-stat troponin, ED     Status: None   Collection Time: 09/10/16 10:27 PM  Result Value Ref Range   Troponin i, poc 0.00 0.00 - 0.08 ng/mL   Comment 3            Comment: Due to the release kinetics of cTnI, a negative result within the first hours of the onset of symptoms does not rule out myocardial infarction with certainty. If myocardial infarction is still suspected, repeat the test at appropriate intervals.   Vitamin B12     Status: None   Collection Time: 10/28/16  8:27 AM  Result Value Ref Range   Vitamin B-12 398 211 - 911 pg/mL  Testosterone     Status: None   Collection Time: 10/28/16  8:27 AM  Result Value Ref Range   Testosterone 305.57 300.00 - 890.00 ng/dL     Past Medical History:  Diagnosis Date  . ADD (attention deficit disorder with hyperactivity)    adult  . Anal sphincter incontinence   . Anxiety   . Depression   . Depression with anxiety 04/12/2011  . Fatigue 04/08/2011  . GERD (gastroesophageal reflux disease)   . History of hypotestosteronemia 04/13/2011  . Hyperlipidemia   . Inclusion cyst 07/08/2010   Qualifier: Diagnosis of  By: Charlett Blake MD, Erline Levine    . Lumbar back pain    with radiculopathy  . 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 Obesity    morbid  . OSA (obstructive sleep apnea)    cpap setting of 14  . Paresthesia of skin 04/12/2011  . Paronychia 10/13/2011  . Rectal bleeding 2017  . Thyroid disease    Hypothyroid  . Tremor    idiopathic  . Vitamin B 12 deficiency 04/13/2011   Allergies  Allergen Reactions  . Adhesive [Tape] Itching and Rash   Past Surgical History:  Procedure Laterality Date  . COLONOSCOPY WITH PROPOFOL N/A 09/17/2016   Procedure: COLONOSCOPY WITH PROPOFOL;  Surgeon: Manus Gunning, MD;  Location: [facility];  Service: Gastroenterology;  Laterality: N/A;  . CYSTECTOMY  10/2011   neck  . meatal surgery to urinary opening  age 28  . wisdom teeth exactraction  age 75   Family History  Problem  Relation Age of Onset  . Hypertension Mother   . Obesity Mother   . Cardiomyopathy Mother   .  Arthritis Father   . Gout Father   . Hyperlipidemia Father   . Hypertension Father   . Cleft palate Father   . Other Father        disassociative fugue  . Obesity Father   . ADD / ADHD Daughter        ADHD  . Coronary artery disease Maternal Grandmother        s/p multiple MI's first one in late 50's  . Other Maternal Grandmother        CHF  . Cancer Maternal Grandmother        breast  . Other Maternal Grandfather        Essential tremors  . Cancer Maternal Grandfather        spinal/ smoker/brain  . Leukemia Paternal Grandmother   . Cancer Paternal Grandmother        leukemia  . Atrial fibrillation Paternal Grandfather 42  . Hypertension Paternal Grandfather   . Other Paternal Audiological scientist  . Cancer Maternal Uncle        colon   Social History   Social History  . Marital status: Married    Spouse name: N/A  . Number of children: N/A  . Years of education: N/A   Occupational History  . Not on file.   Social History Main Topics  . Smoking status: Current Some Day Smoker  . Smokeless tobacco: Never Used  . Alcohol use No     Comment: Rare - 1-2x/year  . Drug use: No  . Sexual activity: Not on file   Other Topics Concern  . Not on file   Social History Narrative  . No narrative on file     ROS: 14 pt review of systems performed and negative (unless mentioned in an HPI)  Objective: BP (!) 142/90 (BP Location: Left Arm, Patient Position: Sitting, Cuff Size: Large)   Pulse 69   Temp 98.4 F (36.9 C)   Resp 20   Ht _0  (1.854 m)   Wt (!) 453 lb 12 oz (205.8 kg)   SpO2 97%   BMI 59.87 kg/m  Gen: Afebrile. No acute distress. Very pleasant, morbidly obese male. HENT: AT. [place].  MMM.  Eyes:Pupils Equal Round Reactive to light, Extraocular movements intact,  Conjunctiva without redness, discharge or icterus. Neck/lymp/endocrine:  Supple, no lymphadenopathy, no thyromegaly CV: RRR , no edema, +2/4 P posterior tibialis pulses Chest: CTAB, no wheeze or crackles Abd: Soft. Obese. NTND. BS present.  Skin: No rashes, purpura or petechiae.  Neuro:  Normal gait. PERLA. EOMi. Alert. Oriented Psych: Normal affect, dress and demeanor. Normal speech. Normal thought content and judgment.  Assessment/plan: Bobby [name] is a 41 y.o. male present for annual exam and followup on fatigue: Depression with anxiety - Continue Celexa 20 mg daily, added Wellbutrin 150 mg daily today. - PH Q9 repeated next visit - Follow-up  in 3 months as long as doing well, sooner if needed.  Hypogonadotropic hypogonadism (HCC)/Morbid obesity (HCC) - Improvement in testosterone level, not quite at therapeutic dose as of yet. Will increase dose to 1.25 mL every 2 weeks by nurse visit. - Goal testosterone level approximately 400.  - Repeat testosterone panel in 3 months. - PSA  to be collected at next visit, and then every 12 months for monitoring. - CBC every 6 months. - Lipid panel every 6-12 months - Follow-up in 3 months  Vitamin B 12 deficiency - Start home injections  1000 mcg weekly.  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  - retest with other labs in 3 mos.    Return in about 3 months (around 02/04/2017) for anxiety/test/b12.  Electronically signed by: Howard Pouch, DO [place]

## 2016-11-16 ENCOUNTER — Telehealth: Payer: Self-pay | Admitting: Family Medicine

## 2016-11-16 NOTE — Telephone Encounter (Signed)
Patient states that he was under the impression that he was going to be getting a new RX for testosterone.  His pharmacy has never received this.  Patient has nurse visit 11/19/16 for injection.  Please advise.

## 2016-11-16 NOTE — Telephone Encounter (Signed)
This Rx was faxed over to patients pharmacy on 11/04/16 called pharmacy and verified they did receive it notified patient Rx is at his pharmacy.

## 2016-11-19 ENCOUNTER — Ambulatory Visit (INDEPENDENT_AMBULATORY_CARE_PROVIDER_SITE_OTHER): Payer: PRIVATE HEALTH INSURANCE | Admitting: Family Medicine

## 2016-11-19 DIAGNOSIS — E538 Deficiency of other specified B group vitamins: Secondary | ICD-10-CM | POA: Diagnosis not present

## 2016-11-19 DIAGNOSIS — E291 Testicular hypofunction: Secondary | ICD-10-CM | POA: Diagnosis not present

## 2016-11-19 MED ORDER — CYANOCOBALAMIN 1000 MCG/ML IJ SOLN
1000.0000 ug | Freq: Once | INTRAMUSCULAR | Status: AC
  Administered 2016-11-19: 1000 ug via INTRAMUSCULAR

## 2016-11-19 MED ORDER — TESTOSTERONE CYPIONATE 200 MG/ML IM SOLN
200.0000 mg | INTRAMUSCULAR | Status: DC
  Administered 2016-11-19: 200 mg via INTRAMUSCULAR

## 2016-11-19 NOTE — Progress Notes (Addendum)
Patient presents today for Vitamin B12 injection and Testosterone injection. Injections given with no incidence or problems and left with no complaints.  * note:  I didn't realize that patient was now given RX for B12 to administer at home and had already drawn up B12 injection.  Patient stated he did not administer B12 this week so I administered since it was already drawn up.    Medical screening examination/treatment/procedure(s) were performed by non-physician practitioner and as supervising physician I was immediately available for consultation/collaboration.  I agree with above assessment and plan.  Electronically Signed by: Felix Pacinienee Kuneff, DO [facility] [place]

## 2016-12-03 ENCOUNTER — Ambulatory Visit (INDEPENDENT_AMBULATORY_CARE_PROVIDER_SITE_OTHER): Payer: PRIVATE HEALTH INSURANCE | Admitting: *Deleted

## 2016-12-03 DIAGNOSIS — E230 Hypopituitarism: Secondary | ICD-10-CM

## 2016-12-03 MED ORDER — TESTOSTERONE CYPIONATE 200 MG/ML IM SOLN
200.0000 mg | Freq: Once | INTRAMUSCULAR | Status: AC
  Administered 2016-12-03: 200 mg via INTRAMUSCULAR

## 2016-12-03 NOTE — Progress Notes (Addendum)
Patient presents today for testosterone injection as ordered per Dr Claiborne BillingsKuneff. Injection given in right upper outer quadrant. Patient tolerated injection well next injection in 14 days.  Medical screening examination/treatment/procedure(s) were performed by non-physician practitioner and as supervising physician I was immediately available for consultation/collaboration.  I agree with above assessment and plan.  Electronically Signed by: Felix Pacinienee Kuneff, DO [facility] [place]

## 2016-12-08 ENCOUNTER — Telehealth: Payer: Self-pay | Admitting: Internal Medicine

## 2016-12-08 DIAGNOSIS — G4733 Obstructive sleep apnea (adult) (pediatric): Secondary | ICD-10-CM

## 2016-12-08 NOTE — Telephone Encounter (Signed)
Pt needing a replacement CPAP mask and tubing.  Pt states that his mask broke last night and he cannot sleep without his CPAP at night. [facility] Pt states he is going to call [facility] today to inquire.  Order placed as the patient has a standing appt with [facility] in Oct 2018.  Spoke with Minna AntisLorna at [facility]. Aware of order.  She is going to reach out to the patient.  Nothing further needed.

## 2016-12-17 ENCOUNTER — Ambulatory Visit: Payer: PRIVATE HEALTH INSURANCE

## 2016-12-22 ENCOUNTER — Ambulatory Visit: Payer: PRIVATE HEALTH INSURANCE

## 2016-12-23 ENCOUNTER — Ambulatory Visit: Payer: PRIVATE HEALTH INSURANCE

## 2016-12-24 ENCOUNTER — Ambulatory Visit (INDEPENDENT_AMBULATORY_CARE_PROVIDER_SITE_OTHER): Payer: PRIVATE HEALTH INSURANCE

## 2016-12-24 DIAGNOSIS — E230 Hypopituitarism: Secondary | ICD-10-CM

## 2016-12-24 MED ORDER — TESTOSTERONE CYPIONATE 200 MG/ML IM SOLN
200.0000 mg | Freq: Once | INTRAMUSCULAR | Status: AC
  Administered 2016-12-24: 200 mg via INTRAMUSCULAR

## 2016-12-24 NOTE — Addendum Note (Signed)
Addendum  created 12/24/16 1057 by Cristela BlueJackson, Nyiah Pianka, MD   Sign clinical note

## 2016-12-24 NOTE — Progress Notes (Addendum)
Patient presents today for Testosterone injection. Given with no incidence or problems. Patient left with no complaints. Medical screening examination/treatment/procedure(s) were performed by non-physician practitioner and as supervising physician I was immediately available for consultation/collaboration.  I agree with above assessment and plan.  Electronically Signed by: Renee Kuneff, DO [facility] [place]   

## 2016-12-24 NOTE — Anesthesia Postprocedure Evaluation (Signed)
Anesthesia Post Note  Patient: Bobby [name]  Procedure(s) Performed: Procedure(s) (LRB): COLONOSCOPY WITH PROPOFOL (N/A)     Anesthesia Post Evaluation  Last Vitals:  Vitals:   09/17/16 0924 09/17/16 0930  BP: 115/65 122/70  Pulse: 66 63  Resp: 17 18  Temp:  36.5 C    Last Pain:  Vitals:   09/20/16 1247  TempSrc:   PainSc: 0-No pain                 Zackry Deines EDWARD

## 2017-01-06 ENCOUNTER — Ambulatory Visit: Payer: PRIVATE HEALTH INSURANCE

## 2017-01-25 ENCOUNTER — Other Ambulatory Visit: Payer: PRIVATE HEALTH INSURANCE

## 2017-01-25 ENCOUNTER — Ambulatory Visit (INDEPENDENT_AMBULATORY_CARE_PROVIDER_SITE_OTHER): Payer: PRIVATE HEALTH INSURANCE

## 2017-01-25 DIAGNOSIS — E230 Hypopituitarism: Secondary | ICD-10-CM | POA: Diagnosis not present

## 2017-01-25 MED ORDER — TESTOSTERONE CYPIONATE 200 MG/ML IM SOLN
200.0000 mg | Freq: Once | INTRAMUSCULAR | Status: AC
  Administered 2017-01-25: 200 mg via INTRAMUSCULAR

## 2017-01-25 NOTE — Progress Notes (Addendum)
Patient presents today for testosterone injection due to missed injection last week. Patient rescheduled lab visit to next week and follow up several days later. Injection given with no incidence or problems. Patient left with no complaints.  Medical screening examination/treatment/procedure(s) were performed by non-physician practitioner and as supervising physician I was immediately available for consultation/collaboration.  I agree with above assessment and plan.  Electronically Signed by: Felix Pacinienee Kuneff, DO [facility] [place]

## 2017-01-31 ENCOUNTER — Ambulatory Visit: Payer: PRIVATE HEALTH INSURANCE | Admitting: Family Medicine

## 2017-01-31 ENCOUNTER — Other Ambulatory Visit (INDEPENDENT_AMBULATORY_CARE_PROVIDER_SITE_OTHER): Payer: No Typology Code available for payment source

## 2017-01-31 ENCOUNTER — Other Ambulatory Visit: Payer: PRIVATE HEALTH INSURANCE

## 2017-01-31 DIAGNOSIS — Z79890 Hormone replacement therapy (postmenopausal): Secondary | ICD-10-CM

## 2017-01-31 DIAGNOSIS — E538 Deficiency of other specified B group vitamins: Secondary | ICD-10-CM | POA: Diagnosis not present

## 2017-01-31 DIAGNOSIS — E230 Hypopituitarism: Secondary | ICD-10-CM

## 2017-01-31 LAB — CBC WITH DIFFERENTIAL/PLATELET
Basophils Absolute: 0 10*3/uL (ref 0.0–0.1)
Basophils Relative: 0.6 % (ref 0.0–3.0)
EOS ABS: 0.1 10*3/uL (ref 0.0–0.7)
Eosinophils Relative: 1.2 % (ref 0.0–5.0)
HEMATOCRIT: 41.7 % (ref 39.0–52.0)
HEMOGLOBIN: 13.7 g/dL (ref 13.0–17.0)
LYMPHS PCT: 24.9 % (ref 12.0–46.0)
Lymphs Abs: 2.2 10*3/uL (ref 0.7–4.0)
MCHC: 32.8 g/dL (ref 30.0–36.0)
MCV: 84.6 fl (ref 78.0–100.0)
MONO ABS: 0.5 10*3/uL (ref 0.1–1.0)
Monocytes Relative: 5.5 % (ref 3.0–12.0)
Neutro Abs: 5.9 10*3/uL (ref 1.4–7.7)
Neutrophils Relative %: 67.8 % (ref 43.0–77.0)
Platelets: 337 10*3/uL (ref 150.0–400.0)
RBC: 4.93 Mil/uL (ref 4.22–5.81)
RDW: 15.5 % (ref 11.5–15.5)
WBC: 8.7 10*3/uL (ref 4.0–10.5)

## 2017-01-31 LAB — VITAMIN B12: Vitamin B-12: 363 pg/mL (ref 211–911)

## 2017-01-31 LAB — PSA: PSA: 0.41 ng/mL (ref 0.10–4.00)

## 2017-02-02 LAB — TESTOSTERONE TOTAL,FREE,BIO, MALES
ALBUMIN: 4.2 g/dL (ref 3.6–5.1)
SEX HORMONE BINDING: 15 nmol/L (ref 10–50)
TESTOSTERONE: 280 ng/dL (ref 250–827)
Testosterone, Bioavailable: 123.1 ng/dL (ref 110.0–575.0)
Testosterone, Free: 63.9 pg/mL (ref 46.0–224.0)

## 2017-02-10 ENCOUNTER — Encounter: Payer: Self-pay | Admitting: Family Medicine

## 2017-02-10 ENCOUNTER — Ambulatory Visit (INDEPENDENT_AMBULATORY_CARE_PROVIDER_SITE_OTHER): Payer: No Typology Code available for payment source | Admitting: Family Medicine

## 2017-02-10 VITALS — BP 133/85 | HR 65 | Temp 98.6°F | Resp 20 | Ht 73.0 in | Wt >= 6400 oz

## 2017-02-10 DIAGNOSIS — F418 Other specified anxiety disorders: Secondary | ICD-10-CM | POA: Diagnosis not present

## 2017-02-10 DIAGNOSIS — E538 Deficiency of other specified B group vitamins: Secondary | ICD-10-CM

## 2017-02-10 DIAGNOSIS — E785 Hyperlipidemia, unspecified: Secondary | ICD-10-CM

## 2017-02-10 DIAGNOSIS — Z6841 Body Mass Index (BMI) 40.0 and over, adult: Secondary | ICD-10-CM

## 2017-02-10 DIAGNOSIS — G4733 Obstructive sleep apnea (adult) (pediatric): Secondary | ICD-10-CM

## 2017-02-10 DIAGNOSIS — E230 Hypopituitarism: Secondary | ICD-10-CM | POA: Diagnosis not present

## 2017-02-10 MED ORDER — CYANOCOBALAMIN 1000 MCG/ML IJ SOLN: 1000.0000 ug | INTRAMUSCULAR | 2 refills | Status: DC

## 2017-02-10 MED ORDER — TESTOSTERONE CYPIONATE 200 MG/ML IM SOLN: 275.0000 mg | INTRAMUSCULAR | 2 refills | Status: DC

## 2017-02-10 MED ORDER — CITALOPRAM HYDROBROMIDE 40 MG PO TABS: 40.0000 mg | ORAL_TABLET | Freq: Every day | ORAL | 1 refills | Status: DC

## 2017-02-10 NOTE — Patient Instructions (Signed)
3 months with fasting labs with testosterone testing 4-5 days prior to injection. Labs the week between injections, provider appt the week after labs.   Please help [place] help you:  We are honored you have chosen [facility] for your Primary Care home. Below you will find basic instructions that you may need to access in the future. Please help [place] help you by reading the instructions, which cover many of the frequent questions we experience.   Prescription refills and request:  -In order to allow more efficient response time, please call your pharmacy for all refills. They will forward the request electronically to [place]. This allows for the quickest possible response. Request left on a nurse line can take longer to refill, since these are checked as time allows between office patients and other phone calls.  - refill request can take up to 3-5 working days to complete.  - If request is sent electronically and request is appropiate, it is usually completed in 1-2 business days.  - all patients will need to be seen routinely for all chronic medical conditions requiring prescription medications (see follow-up below). If you are overdue for follow up on your condition, you will be asked to make an appointment and we will call in enough medication to cover you until your appointment (up to 30 days).  - all controlled substances will require a face to face visit to request/refill.  - if you desire your prescriptions to go through a new pharmacy, and have an active script at original pharmacy, you will need to call your pharmacy and have scripts transferred to new pharmacy. This is completed between the pharmacy locations and not by your provider.    Results: If any images or labs were ordered, it can take up to 1 week to get results depending on the test ordered and the lab/facility running and resulting the test. - Normal or stable results, which do not need further discussion, may be released to your  mychart immediately with attached note to you. A call may not be generated for normal results. Please make certain to sign up for mychart. If you have questions on how to activate your mychart you can call the front office.  - If your results need further discussion, our office will attempt to contact you via phone, and if unable to reach you after 2 attempts, we will release your abnormal result to your mychart with instructions.  - All results will be automatically released in mychart after 1 week.  - Your provider will provide you with explanation and instruction on all relevant material in your results. Please keep in mind, results and labs may appear confusing or abnormal to the untrained eye, but it does not mean they are actually abnormal for you personally. If you have any questions about your results that are not covered, or you desire more detailed explanation than what was provided, you should make an appointment with your provider to do so.   Our office handles many outgoing and incoming calls daily. If we have not contacted you within 1 week about your results, please check your mychart to see if there is a message first and if not, then contact our office.  In helping with this matter, you help decrease call volume, and therefore allow [place] to be able to respond to patients needs more efficiently.   Acute office visits (sick visit):  An acute visit is intended for a new problem and are scheduled in shorter time  slots to allow schedule openings for patients with new problems. This is the appropriate visit to discuss a new problem. In order to provide you with excellent quality medical care with proper time for you to explain your problem, have an exam and receive treatment with instructions, these appointments should be limited to one new problem per visit. If you experience a new problem, in which you desire to be addressed, please make an acute office visit, we save openings on the schedule to  accommodate you. Please do not save your new problem for any other type of visit, let us take care of it properly and quickly for you.   Follow up visits:  Depending on your condition(s) your provider will need to see you routinely in order to provide you with quality care and prescribe medication(s). Most chronic conditions (Example: hypertension, Diabetes, depression/anxiety... etc), require visits a couple times a year. Your provider will instruct you on proper follow up for your personal medical conditions and history. Please make certain to make follow up appointments for your condition as instructed. Failing to do so could result in lapse in your medication treatment/refills. If you request a refill, and are overdue to be seen on a condition, we will always provide you with a 30 day script (once) to allow you time to schedule.    Medicare wellness (well visit): - we have a wonderful Nurse Maudie Mercury), that will meet with you and provide you will yearly medicare wellness visits. These visits should occur yearly (can not be scheduled less than 1 calendar year apart) and cover preventive health, immunizations, advance directives and screenings you are entitled to yearly through your medicare benefits. Do not miss out on your entitled benefits, this is when medicare will pay for these benefits to be ordered for you.  These are strongly encouraged by your provider and is the appropriate type of visit to make certain you are up to date with all preventive health benefits. If you have not had your medicare wellness exam in the last 12 months, please make certain to schedule one by calling the office and schedule your medicare wellness with Maudie Mercury as soon as possible.   Yearly physical (well visit):  - Adults are recommended to be seen yearly for physicals. Check with your insurance and date of your last physical, most insurances require one calendar year between physicals. Physicals include all preventive health  topics, screenings, medical exam and labs that are appropriate for gender/age and history. You may have fasting labs needed at this visit. This is a well visit (not a sick visit), new problems should not be covered during this visit (see acute visit).  - Pediatric patients are seen more frequently when they are younger. Your provider will advise you on well child visit timing that is appropriate for your their age. - This is not a medicare wellness visit. Medicare wellness exams do not have an exam portion to the visit. Some medicare companies allow for a physical, some do not allow a yearly physical. If your medicare allows a yearly physical you can schedule the medicare wellness with our nurse Maudie Mercury and have your physical with your provider after, on the same day. Please check with insurance for your full benefits.   Late Policy/No Shows:  - all new patients should arrive 15-30 minutes earlier than appointment to allow [place] time  to  obtain all personal demographics,  insurance information and for you to complete office paperwork. - All established patients should arrive  10-15 minutes earlier than appointment time to update all information and be checked in .  - In our best efforts to run on time, if you are late for your appointment you will be asked to either reschedule or if able, we will work you back into the schedule. There will be a wait time to work you back in the schedule,  depending on availability.  - If you are unable to make it to your appointment as scheduled, please call 24 hours ahead of time to allow us to fill the time slot with someone else who needs to be seen. If you do not cancel your appointment ahead of time, you may be charged a no show fee.

## 2017-02-10 NOTE — Progress Notes (Signed)
Patient ID: Bobby NoseWilliam P Pasternak, male   DOB: 02/02/1976, 41 y.o.   MRN: [id number]      Patient ID: Bobby NoseWilliam P Francis, male  DOB: 02/01/1976, 41 y.o.   MRN: [id number] Chief Complaint  Patient presents with  . Follow-up    low testosterone/lab results    Subjective:  Bobby [name] is a 41 y.o.  male present for follow up Hypogonadotropic hypogonadism/b12 deficiency/morbid obesity: Patient has been compliant with testosterone cypionate 250 mg every 2 weeks. B-12 injections weekly. Patient had pretty appointment labs collected B-12 363, CBC within normal limits, testosterone 305 --> 280. Testosterone 155 on initiation. He states he is feeling improved, still having some fatigue. Definitely feels the testosterone within his system. He has gained weight, which has steadily climbed up 50 pounds over the last 2 years. He now weighs 450 pounds.   Depression/anxiety:  Patient feels that his anxiety is controlled. He is currently taking Celexa 40 mg daily. He was having some difficulty with irritability and has started seeing a counselor who is working well with him. His irritability and his attention deficit. He knows he has to take that controller for his life, and he is hoping counseling is going to help steer in the right direction.    [facility] 02/10/2017 12/01/2015 12/01/2015  Decreased Interest - 1 0  Down, Depressed, Hopeless 2 2 0  PHQ - 2 Score 2 3 0  Altered sleeping 0 0 -  Tired, decreased energy 3 3 -  Change in appetite 2 2 -  Feeling bad or failure about yourself  2 2 -  Trouble concentrating 1 0 -  Moving slowly or fidgety/restless 1 2 -  Suicidal thoughts 0 0 -  PHQ-9 Score 11 12 -  Difficult doing work/chores Somewhat difficult Somewhat difficult -    GAD 7 : Generalized Anxiety Score 12/01/2015  Nervous, Anxious, on Edge 3  Control/stop worrying 2  Worry too much - different things 3  Trouble relaxing 3  Restless 3  Easily annoyed or irritable 3  Afraid - awful  might happen 3  Total GAD 7 Score 20  Anxiety Difficulty Very difficult    Recent Results (from the past 2160 hour(s))  Vitamin B12     Status: None   Collection Time: 01/31/17  1:43 PM  Result Value Ref Range   Vitamin B-12 363 211 - 911 pg/mL  PSA     Status: None   Collection Time: 01/31/17  1:43 PM  Result Value Ref Range   PSA 0.41 0.10 - 4.00 ng/mL    Comment: Test performed using Access Hybritech PSA Assay, a parmagnetic partical, chemiluminecent immunoassay.  CBC with Differential/Platelet     Status: None   Collection Time: 01/31/17  1:43 PM  Result Value Ref Range   WBC 8.7 4.0 - 10.5 K/uL   RBC 4.93 4.22 - 5.81 Mil/uL   Hemoglobin 13.7 13.0 - 17.0 g/dL   HCT 78.241.7 95.639.0 - 21.352.0 %   MCV 84.6 78.0 - 100.0 fl   MCHC 32.8 30.0 - 36.0 g/dL   RDW 08.615.5 57.811.5 - 46.915.5 %   Platelets 337.0 150.0 - 400.0 K/uL   Neutrophils Relative % 67.8 43.0 - 77.0 %   Lymphocytes Relative 24.9 12.0 - 46.0 %   Monocytes Relative 5.5 3.0 - 12.0 %   Eosinophils Relative 1.2 0.0 - 5.0 %   Basophils Relative 0.6 0.0 - 3.0 %   Neutro Abs 5.9 1.4 - 7.7 K/uL  Lymphs Abs 2.2 0.7 - 4.0 K/uL   Monocytes Absolute 0.5 0.1 - 1.0 K/uL   Eosinophils Absolute 0.1 0.0 - 0.7 K/uL   Basophils Absolute 0.0 0.0 - 0.1 K/uL  Testosterone Total,Free,Bio, Males     Status: None   Collection Time: 01/31/17  1:43 PM  Result Value Ref Range   Testosterone 280 250 - 827 ng/dL   Albumin 4.2 3.6 - 5.1 g/dL   Sex Hormone Binding 15 10 - 50 nmol/L   Testosterone, Free 63.9 46.0 - 224.0 pg/mL   Testosterone, Bioavailable 123.1 110.0 - 575.0 ng/dL     Past Medical History:  Diagnosis Date  . ADD (attention deficit disorder with hyperactivity)    adult  . Anal sphincter incontinence   . Anxiety   . Depression   . Depression with anxiety 04/12/2011  . Fatigue 04/08/2011  . GERD (gastroesophageal reflux disease)   . History of hypotestosteronemia 04/13/2011  . Hyperlipidemia   . Inclusion cyst 07/08/2010    Qualifier: Diagnosis of  By: Abner Greenspan MD, Misty Stanley    . Lumbar back pain    with radiculopathy  . 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 Obesity    morbid  . OSA (obstructive sleep apnea)    cpap setting of 14  . Paresthesia of skin 04/12/2011  . Paronychia 10/13/2011  . Rectal bleeding 2017  . Thyroid disease    Hypothyroid  . Tremor    idiopathic  . Vitamin B 12 deficiency 04/13/2011   Allergies  Allergen Reactions  . Adhesive [Tape] Itching and Rash   Past Surgical History:  Procedure Laterality Date  . COLONOSCOPY WITH PROPOFOL N/A 09/17/2016   Procedure: COLONOSCOPY WITH PROPOFOL;  Surgeon: Ruffin Frederick, MD;  Location: [facility];  Service: Gastroenterology;  Laterality: N/A;  . CYSTECTOMY  10/2011   neck  . meatal surgery to urinary opening  age 57  . wisdom teeth exactraction  age 18   Family History  Problem Relation Age of Onset  . Hypertension Mother   . Obesity Mother   . Cardiomyopathy Mother   . Arthritis Father   . Gout Father   . Hyperlipidemia Father   . Hypertension Father   . Cleft palate Father   . Other Father        disassociative fugue  . Obesity Father   . ADD / ADHD Daughter        ADHD  . Coronary artery disease Maternal Grandmother        s/p multiple MI's first one in late 87's  . Other Maternal Grandmother        CHF  . Cancer Maternal Grandmother        breast  . Other Maternal Grandfather        Essential tremors  . Cancer Maternal Grandfather        spinal/ smoker/brain  . Leukemia Paternal Grandmother   . Cancer Paternal Grandmother        leukemia  . Atrial fibrillation Paternal Grandfather 47  . Hypertension Paternal Grandfather   . Other Paternal Art gallery manager  . Cancer Maternal Uncle        colon   Social History   Social History  . Marital status: Married    Spouse name: N/A  . Number of children: N/A  . Years of education: N/A   Occupational History  . Not on file.   Social History Main  Topics  . Smoking status: Former  Smoker  . Smokeless tobacco: Never Used  . Alcohol use No     Comment: Rare - 1-2x/year  . Drug use: No  . Sexual activity: Not on file   Other Topics Concern  . Not on file   Social History Narrative  . No narrative on file     ROS: 14 pt review of systems performed and negative (unless mentioned in an HPI)  Objective: BP 133/85 (BP Location: Right Arm, Patient Position: Sitting, Cuff Size: Large)   Pulse 65   Temp 98.6 F (37 C)   Resp 20   Ht 6\' 1"  (1.854 m)   Wt (!) 450 lb 8 oz (204.3 kg)   SpO2 97%   BMI 59.44 kg/m  Gen: Afebrile. No acute distress. Nontoxic in appearance, well-developed, well-nourished, morbidly obese Caucasian male. HENT: AT. [place].  MMM.  Eyes:Pupils Equal Round Reactive to light, Extraocular movements intact,  Conjunctiva without redness, discharge or icterus. CV: RRR no murmur, no edema Chest: CTAB, no wheeze or crackles Abd: Soft. Morbidly obese. NTND. BS present.  Neuro: Normal gait. PERLA. EOMi. Alert. Oriented x3   Psych: Normal affect, dress and demeanor. Normal speech. Normal thought content and judgment.  Assessment/plan: Bobby [name] is a 41 y.o. male present for annual exam and followup on fatigue: Depression with anxiety - Continue Celexa 40 mg daily. Refills provided today - continue counseling  - Discontinued Wellbutrin has not been taking for months - PH Q9 repeated today and still significant. He feels counseling should be able to help and will wait to discuss further medications.  - Follow-up  in 3 months as long as doing well, sooner if needed.  Hypogonadotropic hypogonadism (HCC)/Morbid obesity (HCC) Hyperlipidemia - Reviewed labs in detail with patient today. Patient's testosterone still is not at therapeutic dose despite increased on last appointment. He has gained additional weight. Discussed with him his weight increasing is working against our testosterone supplementation. He agrees and  understands. We will increase his testosterone again to 275 mg every 2 weeks. Follow-up in 3 months with pre-labs collected the week before his appointment. Lab should be collected on the off week of testosterone supplementation. - May need to consider lower weekly dosing vs endocrine referral. - Goal testosterone level approximately 400.  - PSA  Due 01/2018. - CBC every 6 months Due 07/2017 - Lipid panel Due next visit.  - Follow-up in 3 months  Vitamin B 12 deficiency - Continue home injections 1000 mcg weekly.  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 Discussed within the start oral supplementation daily as well. Although he does have intrinsic factor deficiency, he still may be a result absorb some to get benefit. - retest with other labs in 3 mos.    Return in about 3 months (around 05/13/2017).  Electronically signed by: Felix Pacini, DO [facility] [place]

## 2017-02-15 ENCOUNTER — Ambulatory Visit: Payer: No Typology Code available for payment source

## 2017-02-17 ENCOUNTER — Ambulatory Visit (INDEPENDENT_AMBULATORY_CARE_PROVIDER_SITE_OTHER): Payer: No Typology Code available for payment source | Admitting: *Deleted

## 2017-02-17 DIAGNOSIS — E230 Hypopituitarism: Secondary | ICD-10-CM | POA: Diagnosis not present

## 2017-02-17 MED ORDER — TESTOSTERONE CYPIONATE 200 MG/ML IM SOLN
275.0000 mg | INTRAMUSCULAR | Status: DC
  Administered 2017-02-17 – 2017-03-22 (×2): 275 mg via INTRAMUSCULAR

## 2017-02-17 NOTE — Progress Notes (Signed)
Patient presents today for testosterone injection number 1 of 5 with new dosing of 275 mg IM every 14 days. Patient will need lab draw testosterone level 1 week after injection number 5 as ordered by Dr Claiborne BillingsKuneff. Patient tolerated injection well.

## 2017-03-03 ENCOUNTER — Ambulatory Visit: Payer: No Typology Code available for payment source

## 2017-03-21 ENCOUNTER — Ambulatory Visit (INDEPENDENT_AMBULATORY_CARE_PROVIDER_SITE_OTHER): Payer: No Typology Code available for payment source | Admitting: Internal Medicine

## 2017-03-21 ENCOUNTER — Encounter: Payer: Self-pay | Admitting: Internal Medicine

## 2017-03-21 VITALS — BP 118/70 | HR 77 | Ht 73.0 in | Wt >= 6400 oz

## 2017-03-21 DIAGNOSIS — Z23 Encounter for immunization: Secondary | ICD-10-CM | POA: Diagnosis not present

## 2017-03-21 DIAGNOSIS — G4733 Obstructive sleep apnea (adult) (pediatric): Secondary | ICD-10-CM | POA: Diagnosis not present

## 2017-03-21 NOTE — Patient Instructions (Addendum)
Order DME Aps- please replace old CPAP machine, auto 5-20, mask of choice, humidifier, supplies AirView   Dx OSA  Flu vax- standard  Please call if we can help

## 2017-03-21 NOTE — Progress Notes (Signed)
HPI male never smoker followed for OSA, complicated by morbid obesity, ADHD, GERD NPSG 2006 AHI 72, weight 332 lbs 14 cwp    ----------------------------------------------------------------------------------------- 03/14/15- 39 yoM never smoker Referred by Dr. Abner Greenspan for sleep apnea.  pt had sleep study in 2006.  Pt currently uses cpap daily, needs new supplies.  pt unsure of dme provider.  FOLLOWS FOR: pt. wears CPAP every night 6-7 hours. pressure is good. no DL. pt. has had issues with changing DME per last order. He needs additional help her get changed from his previous DME company to a local one which can provide replacement straps and supplies. He continues to use CPAP all night every night and feels dependent upon it for good sleep. He indicates he is making some effort to lose weight.  09/15/2015-41 year old male never smoker followed for OSA CPAP APS/ FOLLOWS FOR: DME-APS. Pt states he wears CPAP every night for about 6-7.5  hours. No supplies needed at this time. No DL nor in AirView.   Weight today back up to 455 pounds His machine was from [employer] [employer] He was changed to [facility]PS for supplies only, but his machine is over 24 years old and we can't monitor it. He has been very compliant, but with weight gain is noting some daytime tiredness and is snoring again. Current pressure is 14.  ROS-see HPI   + = positive Constitutional:    weight loss, night sweats, fevers, chills, + fatigue, lassitude. HEENT:    headaches, difficulty swallowing, tooth/dental problems, sore throat,       sneezing, itching, ear ache, +nasal congestion, post nasal drip, snoring CV:    chest pain, orthopnea, PND, swelling in lower extremities, anasarca,                                                       dizziness, palpitations Resp:   shortness of breath with exertion or at rest.                productive cough,   non-productive cough, coughing up of blood.              change in color of mucus.  wheezing.   Skin:     rash or lesions. GI:  No-   Heartburn,+ indigestion,  GU: dpain. MS:   joint pain, stiffness, decreased range of motion, back pain. Neuro-     nothing unusual Psych:  change in mood or affect.  depression or anxiety.   memory loss.  OBJ- Physical Exam General- Alert, Oriented, Affect-appropriate, Distress- none acute, + morbidly obese                +morbid obesity Skin- rash-none, lesions- none, excoriation- none Lymphadenopathy- none Head- atraumatic            Eyes- Gross vision intact, PERRLA, conjunctivae and secretions clear            Ears- Hearing, canals-normal            Nose- Clear, no-Septal dev, mucus, polyps, erosion, perforation             Throat- Mallampati IV , mucosa clear , drainage- none, tonsils- atrophic Neck- flexible , trachea midline, no stridor , thyroid nl, carotid no bruit Chest - symmetrical excursion , unlabored  Heart/CV- RRR , no murmur , no gallop  , no rub, nl s1 s2                           - JVD- none , edema- none, stasis changes- none, varices- none           Lung- clear to P&A, wheeze- none, cough- none , dullness-none, rub- none           Chest wall-  Abd-  Br/ Gen/ Rectal- Not done, not indicated Extrem- cyanosis- none, clubbing, none, atrophy- none, strength- nl, left foot in boot after stress fracture Neuro- grossly intact to observation

## 2017-03-21 NOTE — Assessment & Plan Note (Signed)
He had looked into bariatric surgery in the past but insurance wouldn't cover it. I suggested he at least talk to the bariatric program about dietary and nutritional counseling.

## 2017-03-21 NOTE — Assessment & Plan Note (Signed)
CPAP remains at 14 but he has gained 120 pounds since his sleep study. His machine is old enough to replace and I would like to change him to AutoPap 5-20. He needs to be fully established with his current DME company. Plan-APS new machine AutoPap 5-20

## 2017-03-22 ENCOUNTER — Ambulatory Visit (INDEPENDENT_AMBULATORY_CARE_PROVIDER_SITE_OTHER): Payer: No Typology Code available for payment source | Admitting: Family Medicine

## 2017-03-22 DIAGNOSIS — E291 Testicular hypofunction: Secondary | ICD-10-CM | POA: Diagnosis not present

## 2017-03-22 DIAGNOSIS — E230 Hypopituitarism: Secondary | ICD-10-CM

## 2017-03-22 NOTE — Progress Notes (Addendum)
Patient presented to office today for  testosterone injection.  Patient tolerated well.  Patient did miss last dose.    Medical screening examination/treatment/procedure(s) were performed by non-physician practitioner and as supervising physician I was immediately available for consultation/collaboration.  I agree with above assessment and plan.  Electronically Signed by: Felix Pacini, DO [facility] [place]

## 2017-06-21 ENCOUNTER — Ambulatory Visit: Payer: No Typology Code available for payment source | Admitting: Internal Medicine

## 2017-09-14 ENCOUNTER — Encounter: Payer: Self-pay | Admitting: Family Medicine

## 2017-09-14 ENCOUNTER — Ambulatory Visit: Payer: 59 | Admitting: Family Medicine

## 2017-09-14 VITALS — BP 148/88 | HR 67 | Temp 98.0°F | Resp 20 | Ht 73.0 in | Wt >= 6400 oz

## 2017-09-14 DIAGNOSIS — R102 Pelvic and perineal pain: Secondary | ICD-10-CM | POA: Diagnosis not present

## 2017-09-14 DIAGNOSIS — R140 Abdominal distension (gaseous): Secondary | ICD-10-CM | POA: Diagnosis not present

## 2017-09-14 DIAGNOSIS — R1030 Lower abdominal pain, unspecified: Secondary | ICD-10-CM

## 2017-09-14 DIAGNOSIS — M958 Other specified acquired deformities of musculoskeletal system: Secondary | ICD-10-CM | POA: Diagnosis not present

## 2017-09-14 LAB — POC URINALSYSI DIPSTICK (AUTOMATED)
BILIRUBIN UA: NEGATIVE
Blood, UA: NEGATIVE
GLUCOSE UA: NEGATIVE
KETONES UA: NEGATIVE
Leukocytes, UA: NEGATIVE
Nitrite, UA: NEGATIVE
Protein, UA: NEGATIVE
Spec Grav, UA: >=1.03 — AB (ref 1.010–1.025)
Urobilinogen, UA: 0.2 E.U./dL
pH, UA: 6 (ref 5.0–8.0)

## 2017-09-14 LAB — BASIC METABOLIC PANEL
BUN: 13 mg/dL (ref 6–23)
CHLORIDE: 102 meq/L (ref 96–112)
CO2: 30 meq/L (ref 19–32)
CREATININE: 0.95 mg/dL (ref 0.40–1.50)
Calcium: 8.9 mg/dL (ref 8.4–10.5)
GFR: 92.41 mL/min (ref >60.00)
GLUCOSE: 126 mg/dL — AB (ref 70–99)
Potassium: 4.1 mEq/L (ref 3.5–5.1)
Sodium: 138 mEq/L (ref 135–145)

## 2017-09-14 NOTE — Patient Instructions (Signed)
Hernia A hernia happens when an organ or tissue inside your body pushes out through a weak spot in the belly (abdomen). Follow these instructions at home:  Avoid stretching or overusing (straining) the muscles near the hernia.  Do not lift anything heavier than 10 lb (4.5 kg).  Use the muscles in your leg when you lift something up. Do not use the muscles in your back.  When you cough, try to cough gently.  Eat a diet that has a lot of fiber. Eat lots of fruits and vegetables.  Drink enough fluids to keep your pee (urine) clear or pale yellow. Try to drink 6-8 glasses of water a day.  Take medicines to make your poop soft (stool softeners) as told by your doctor.  Lose weight, if you are overweight.  Do not use any tobacco products, including cigarettes, chewing tobacco, or electronic cigarettes. If you need help quitting, ask your doctor.  Keep all follow-up visits as told by your doctor. This is important. Contact a doctor if:  The skin by the hernia gets puffy (swollen) or red.  The hernia is painful. Get help right away if:  You have a fever.  You have belly pain that is getting worse.  You feel sick to your stomach (nauseous) or you throw up (vomit).  You cannot push the hernia back in place by gently pressing on it while you are lying down.  The hernia: ? Changes in shape or size. ? Is stuck outside your belly. ? Changes color. ? Feels hard or tender. This information is not intended to replace advice given to you by your health care provider. Make sure you discuss any questions you have with your health care provider. Document Released: 11/18/2009 Document Revised: 11/06/2015 Document Reviewed: 04/10/2014 Elsevier Interactive Patient Education  2018 Elsevier Inc.  

## 2017-09-14 NOTE — Progress Notes (Signed)
Bobby NoseWilliam P [name] , 01/06/1976, 42 y.o., male MRN: [id number] Patient Care Team    Relationship Specialty Notifications Start End  Natalia LeatherwoodKuneff, Eshan Trupiano A, DO PCP - General Family Medicine  11/26/15    Comment: Too hard to get an appointment in [place]  Himmelrich, Loree FeeSara S, RD (Inactive) Dietitian   01/18/11     Chief Complaint  Patient presents with  . Abdominal Pain    lower mid abdomen     Subjective: Pt presents for an OV with complaints of lower abdominal/pelvic pain of 5 days duration.  Pain started after he quickly exited his truck secondary to urinary urgency.  Reports after he started urinating he had a very sharp pain in his lower abdomen that did not resolve.  He describes the pain has intense burning and soreness.  Associated symptoms include moderate-severe lower pelvic/abdominal pressure and pain.  Pain has become constant, worse with bearing down/abdominal pressure.  Patient feels he has a bulge above his right groin.  He reports he is urinating and having bowel movements normally.  However bowel movements can be uncomfortable when bearing down.  The pain is worse on his right lower abdomen greater than his left, but is present on the left as well.  He denies fever, chills, nausea, vomiting or diarrhea.  Patient has tried NSAIDs and rest for discomfort.  He is worried he has a hernia or appendicitis.  [facility] 02/10/2017 12/01/2015 12/01/2015  Decreased Interest - 1 0  Down, Depressed, Hopeless 2 2 0  PHQ - 2 Score 2 3 0  Altered sleeping 0 0 -  Tired, decreased energy 3 3 -  Change in appetite 2 2 -  Feeling bad or failure about yourself  2 2 -  Trouble concentrating 1 0 -  Moving slowly or fidgety/restless 1 2 -  Suicidal thoughts 0 0 -  PHQ-9 Score 11 12 -  Difficult doing work/chores Somewhat difficult Somewhat difficult -    Allergies  Allergen Reactions  . Adhesive [Tape] Itching and Rash   Social History   Tobacco Use  . Smoking status: Former Games developermoker  .  Smokeless tobacco: Never Used  Substance Use Topics  . Alcohol use: No    Comment: Rare - 1-2x/year   Past Medical History:  Diagnosis Date  . ADD (attention deficit disorder with hyperactivity)    adult  . Anal sphincter incontinence   . Anxiety   . Depression   . Depression with anxiety 04/12/2011  . Fatigue 04/08/2011  . GERD (gastroesophageal reflux disease)   . History of hypotestosteronemia 04/13/2011  . Hyperlipidemia   . Inclusion cyst 07/08/2010   Qualifier: Diagnosis of  By: Abner GreenspanBlyth MD, Misty StanleyStacey    . Lumbar back pain    with radiculopathy  . 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 Obesity    morbid  . OSA (obstructive sleep apnea)    cpap setting of 14  . Paresthesia of skin 04/12/2011  . Paronychia 10/13/2011  . Rectal bleeding 2017  . Thyroid disease    Hypothyroid  . Tremor    idiopathic  . Vitamin B 12 deficiency 04/13/2011   Past Surgical History:  Procedure Laterality Date  . COLONOSCOPY WITH PROPOFOL N/A 09/17/2016   Procedure: COLONOSCOPY WITH PROPOFOL;  Surgeon: Ruffin FrederickSteven Paul Armbruster, MD;  Location: [facility];  Service: Gastroenterology;  Laterality: N/A;  . CYSTECTOMY  10/2011   neck  . meatal surgery to urinary opening  age 15  . wisdom teeth exactraction  age 78   Family History  Problem Relation Age of Onset  . Hypertension Mother   . Obesity Mother   . Cardiomyopathy Mother   . Arthritis Father   . Gout Father   . Hyperlipidemia Father   . Hypertension Father   . Cleft palate Father   . Other Father        disassociative fugue  . Obesity Father   . ADD / ADHD Daughter        ADHD  . Coronary artery disease Maternal Grandmother        s/p multiple MI's first one in late 49's  . Other Maternal Grandmother        CHF  . Cancer Maternal Grandmother        breast  . Other Maternal Grandfather        Essential tremors  . Cancer Maternal Grandfather        spinal/ smoker/brain  . Leukemia Paternal Grandmother   . Cancer Paternal Grandmother         leukemia  . Atrial fibrillation Paternal Grandfather 25  . Hypertension Paternal Grandfather   . Other Paternal Art gallery manager  . Cancer Maternal Uncle        colon   Allergies as of 09/14/2017      Reactions   Adhesive [tape] Itching, Rash      Medication List        Accurate as of 09/14/17  2:01 PM. Always use your most recent med list.          aspirin EC 81 MG tablet Take 81 mg by mouth at bedtime.   citalopram 40 MG tablet Commonly known as:  CELEXA Take 1 tablet (40 mg total) by mouth daily.   cyanocobalamin 1000 MCG/ML injection Commonly known as:  (VITAMIN B-12) Inject 1 mL (1,000 mcg total) into the muscle once a week.   multivitamin with minerals Tabs tablet Take 1 tablet by mouth daily.   ranitidine 150 MG tablet Commonly known as:  ZANTAC Take 1 tablet (150 mg total) by mouth at bedtime.   SYRINGE-NEEDLE (DISP) 3 ML 25G X 1" 3 ML Misc Commonly known as:  BD ECLIPSE SYRINGE 1 Syringe by Does not apply route once a week. Use for B 12 injection   testosterone cypionate 200 MG/ML injection Commonly known as:  DEPOTESTOSTERONE CYPIONATE Inject 1.38 mLs (275 mg total) into the muscle every 14 (fourteen) days.       All past medical history, surgical history, allergies, family history, immunizations andmedications were updated in the EMR today and reviewed under the history and medication portions of their EMR.     ROS: Negative, with the exception of above mentioned in HPI   Objective:  BP (!) 148/88 (BP Location: Left Arm, Patient Position: Sitting, Cuff Size: Large)   Pulse 67   Temp 98 F (36.7 C)   Resp 20   Ht 6\' 1"  (1.854 m)   Wt (!) 452 lb (205 kg)   SpO2 97%   BMI 59.63 kg/m  Body mass index is 59.63 kg/m. Gen: Afebrile. No acute distress. Nontoxic in appearance, well developed, well nourished.  Early obese male. HENT: AT. [place]. MMM Eyes:Pupils Equal Round Reactive to light, Extraocular movements intact,   Conjunctiva without redness, discharge or icterus. CV: RRR  Chest: CTAB, no wheeze or crackles. Good air movement, normal resp effort.  Abd: Soft.  Obese.  Distended.  Moderate to severely tender  to palpation right lower quadrant, left lower quadrant mild tenderness, right inguinal moderate tenderness. BS present.  Mild guarding, no rebound.  Morbid obesity hinders exam. Skin: no rashes, purpura or petechiae.  Neuro: Slow guarded gait. PERLA. EOMi. Alert. Oriented x3   No exam data present No results found. Results for orders placed or performed in visit on 09/14/17 (from the past 24 hour(s))  POCT Urinalysis Dipstick (Automated)     Status: Abnormal   Collection Time: 09/14/17  1:58 PM  Result Value Ref Range   Color, UA yellow    Clarity, UA clear    Glucose, UA negative    Bilirubin, UA negative    Ketones, UA negative    Spec Grav, UA >=1.030 (A) 1.010 - 1.025   Blood, UA negative    pH, UA 6.0 5.0 - 8.0   Protein, UA negative    Urobilinogen, UA 0.2 0.2 or 1.0 E.U./dL   Nitrite, UA negative    Leukocytes, UA Negative Negative    Assessment/Plan: Bobby [name] is a 43 y.o. male present for OV for abdominal pain Pelvic pain/Morbid obesity (HCC)/Abdominal wall defect, acquired Lower abdominal pain/Abdominal distension  -Patient appears to be in moderate to severe pain today.  Pain is worsening and now persistent, after acute injury 5 days ago.  Suspect possible hernia.  Patient has increased discomfort when transitioning positions and bearing down.  Exam is difficult for masses given patient's morbid obesity. - POCT Urinalysis Dipstick (Automated)--> normal - Basic Metabolic Panel (BMET) - CT Abdomen Pelvis W Contrast; Future - He would like to avoid any narcotic, and will continue to take NSAIDs for pain. -Patient to go to emergency room immediately if pain worsens before CT exam can be completed outpatient and/or he experiences difficulty with bowel movements, urinary  complications, fever, etc.  Reviewed expectations re: course of current medical issues.  Discussed self-management of symptoms.  Outlined signs and symptoms indicating need for more acute intervention.  Patient verbalized understanding and all questions were answered.  Patient received an After-Visit Summary.    Orders Placed This Encounter  Procedures  . POCT Urinalysis Dipstick (Automated)     Note is dictated utilizing voice recognition software. Although note has been proof read prior to signing, occasional typographical errors still can be missed. If any questions arise, please do not hesitate to call for verification.   electronically signed by:  Felix Pacini, DO  [facility] [place]

## 2017-09-14 NOTE — Progress Notes (Signed)
ell

## 2017-09-15 ENCOUNTER — Ambulatory Visit
Admission: RE | Admit: 2017-09-15 | Discharge: 2017-09-15 | Disposition: A | Discharge status: Home / Self Care | Payer: 59 | Source: Ambulatory Visit | Attending: Family Medicine | Admitting: Family Medicine

## 2017-09-15 ENCOUNTER — Telehealth: Payer: Self-pay | Admitting: Family Medicine

## 2017-09-15 DIAGNOSIS — R102 Pelvic and perineal pain: Secondary | ICD-10-CM

## 2017-09-15 DIAGNOSIS — R140 Abdominal distension (gaseous): Secondary | ICD-10-CM

## 2017-09-15 DIAGNOSIS — R1030 Lower abdominal pain, unspecified: Secondary | ICD-10-CM

## 2017-09-15 DIAGNOSIS — M958 Other specified acquired deformities of musculoskeletal system: Secondary | ICD-10-CM

## 2017-09-15 MED ORDER — IOPAMIDOL (ISOVUE-300) INJECTION 61%
125.0000 mL | Freq: Once | INTRAVENOUS | Status: AC | PRN
  Administered 2017-09-15: 125 mL via INTRAVENOUS

## 2017-09-15 NOTE — Telephone Encounter (Signed)
I called patient to give him appointment information for CT scan (09/23/17) but also offered a [facility]. Patient has decided to do walk-in option today.  Patient needs a note to excuse him from his job for the remainder of this week. His employer will not let him return to work until he comes back to work with a note clearing him to work.  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 Advised patient PCP out today however will hopefully be back tomorrow.

## 2017-09-16 ENCOUNTER — Telehealth: Payer: Self-pay | Admitting: Family Medicine

## 2017-09-16 DIAGNOSIS — K4090 Unilateral inguinal hernia, without obstruction or gangrene, not specified as recurrent: Secondary | ICD-10-CM

## 2017-09-16 NOTE — Telephone Encounter (Signed)
Please write note for him from work. The request is not clear on dates he needs by phone note. If he needs the rest of the week that is ok. (please see Diane's phone note) His CT resulted with a "tiny" hernia at umbilicus (belly button) and "small" hernia left inguinal.  He can go back to work, but should cautious lifting heavy objects. Some smaller hernias do not need surgery, some do. They all have the potential to enlarge. I have placed a referral to surgical eval to discuss options with them.

## 2017-09-16 NOTE — Telephone Encounter (Signed)
Left detailed message with results and instructions on patient voice mail per DPR work note placed at front desk.

## 2017-09-19 ENCOUNTER — Ambulatory Visit: Payer: No Typology Code available for payment source | Admitting: Internal Medicine

## 2017-09-23 ENCOUNTER — Other Ambulatory Visit: Payer: 59

## 2017-11-17 ENCOUNTER — Encounter: Payer: Self-pay | Admitting: Family Medicine

## 2017-11-17 ENCOUNTER — Ambulatory Visit: Payer: 59 | Admitting: Family Medicine

## 2017-11-17 ENCOUNTER — Other Ambulatory Visit (HOSPITAL_COMMUNITY)
Admission: RE | Admit: 2017-11-17 | Discharge: 2017-11-17 | Disposition: A | Discharge status: Home / Self Care | Payer: 59 | Source: Ambulatory Visit | Attending: Family Medicine | Admitting: Family Medicine

## 2017-11-17 VITALS — BP 135/85 | HR 79 | Temp 98.3°F | Resp 20 | Ht 73.0 in | Wt >= 6400 oz

## 2017-11-17 DIAGNOSIS — Z202 Contact with and (suspected) exposure to infections with a predominantly sexual mode of transmission: Secondary | ICD-10-CM | POA: Diagnosis not present

## 2017-11-17 DIAGNOSIS — R300 Dysuria: Secondary | ICD-10-CM | POA: Insufficient documentation

## 2017-11-17 LAB — POC URINALSYSI DIPSTICK (AUTOMATED)
Bilirubin, UA: NEGATIVE
Glucose, UA: NEGATIVE
KETONES UA: NEGATIVE
Leukocytes, UA: NEGATIVE
Nitrite, UA: NEGATIVE
PROTEIN UA: NEGATIVE
RBC UA: NEGATIVE
Spec Grav, UA: >=1.03 — AB (ref 1.010–1.025)
UROBILINOGEN UA: 0.2 U/dL
pH, UA: 5.5 (ref 5.0–8.0)

## 2017-11-17 MED ORDER — CEPHALEXIN 500 MG PO CAPS: 500.0000 mg | ORAL_CAPSULE | Freq: Four times a day (QID) | ORAL | 0 refills | Status: DC

## 2017-11-17 NOTE — Patient Instructions (Addendum)
We will call you with all lab results once they are resulted, which will probably be  Monday or Tuesday. I have printed you an abx to treat if urinary infection. Do not use unless you have increased sx over the weekend or we call you with urine culture.

## 2017-11-17 NOTE — Progress Notes (Signed)
Bobby [name] , 1975/07/30, 42 y.o., male MRN: [id number] Patient Care Team    Relationship Specialty Notifications Start End  Bobby Leatherwood, DO PCP - General Family Medicine  11/26/15    Comment: Too hard to get an appointment in [place]  Bobby [name], RD (Inactive) Dietitian   01/18/11     Chief Complaint  Patient presents with  . discomfort    irritation to penis x 1 week     Subjective: Pt presents for an OV with complaints of irritation of urethral meatus of 1 week duration.  Associated symptoms include nothing.  Patient denies penile discharge, redness, swelling or lesions.  He reports he is concerned he may have been exposed to a STD.  His wife was also recently diagnosed with a urinary tract infection started on Keflex.  He denies fever, chills, abdominal pain, nausea or vomit.   Depression screen [facility] 2/9 02/10/2017 12/01/2015 12/01/2015  Decreased Interest - 1 0  Down, Depressed, Hopeless 2 2 0  PHQ - 2 Score 2 3 0  Altered sleeping 0 0 -  Tired, decreased energy 3 3 -  Change in appetite 2 2 -  Feeling bad or failure about yourself  2 2 -  Trouble concentrating 1 0 -  Moving slowly or fidgety/restless 1 2 -  Suicidal thoughts 0 0 -  PHQ-9 Score 11 12 -  Difficult doing work/chores Somewhat difficult Somewhat difficult -    Allergies  Allergen Reactions  . Adhesive [Tape] Itching and Rash   Social History   Tobacco Use  . Smoking status: Former Games developer  . Smokeless tobacco: Never Used  Substance Use Topics  . Alcohol use: No    Comment: Rare - 1-2x/year   Past Medical History:  Diagnosis Date  . ADD (attention deficit disorder with hyperactivity)    adult  . Anal sphincter incontinence   . Anxiety   . Depression   . Depression with anxiety 04/12/2011  . Fatigue 04/08/2011  . GERD (gastroesophageal reflux disease)   . History of hypotestosteronemia 04/13/2011  . Hyperlipidemia   . Inclusion cyst 07/08/2010   Qualifier: Diagnosis of  By: Bobby Greenspan MD,  Bobby [name]    . Lumbar back pain    with radiculopathy  . 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 Obesity    morbid  . OSA (obstructive sleep apnea)    cpap setting of 14  . Paresthesia of skin 04/12/2011  . Paronychia 10/13/2011  . Rectal bleeding 2017  . Thyroid disease    Hypothyroid  . Tremor    idiopathic  . Vitamin B 12 deficiency 04/13/2011   Past Surgical History:  Procedure Laterality Date  . COLONOSCOPY WITH PROPOFOL N/A 09/17/2016   Procedure: COLONOSCOPY WITH PROPOFOL;  Surgeon: Bobby Frederick, MD;  Location: [facility];  Service: Gastroenterology;  Laterality: N/A;  . CYSTECTOMY  10/2011   neck  . meatal surgery to urinary opening  age 39  . wisdom teeth exactraction  age 32   Family History  Problem Relation Age of Onset  . Hypertension Mother   . Obesity Mother   . Cardiomyopathy Mother   . Arthritis Father   . Gout Father   . Hyperlipidemia Father   . Hypertension Father   . Cleft palate Father   . Other Father        disassociative fugue  . Obesity Father   . ADD / ADHD Daughter        ADHD  . Coronary  artery disease Maternal Grandmother        s/p multiple MI's first one in late 4830's  . Other Maternal Grandmother        CHF  . Cancer Maternal Grandmother        breast  . Other Maternal Grandfather        Essential tremors  . Cancer Maternal Grandfather        spinal/ smoker/brain  . Leukemia Paternal Grandmother   . Cancer Paternal Grandmother        leukemia  . Atrial fibrillation Paternal Grandfather [id number]  . Hypertension Paternal Grandfather   . Other Paternal Art gallery managerGrandfather        pacer/defibrillator  . Cancer Maternal Uncle        colon   Allergies as of 11/17/2017      Reactions   Adhesive [tape] Itching, Rash      Medication List        Accurate as of 11/17/17 10:02 AM. Always use your most recent med list.          aspirin EC 81 MG tablet Take 81 mg by mouth at bedtime.   citalopram 40 MG tablet Commonly known as:  CELEXA Take 1  tablet (40 mg total) by mouth daily.   multivitamin with minerals Tabs tablet Take 1 tablet by mouth daily.   ranitidine 150 MG tablet Commonly known as:  ZANTAC Take 1 tablet (150 mg total) by mouth at bedtime.       All past medical history, surgical history, allergies, family history, immunizations andmedications were updated in the EMR today and reviewed under the history and medication portions of their EMR.     ROS: Negative, with the exception of above mentioned in HPI   Objective:  BP 135/85 (BP Location: Left Arm, Patient Position: Sitting, Cuff Size: Large)   Pulse 79   Temp 98.3 F (36.8 C)   Resp 20   Ht 6\' 1"  (1.854 m)   Wt (!) 460 lb (208.7 kg)   SpO2 97%   BMI 60.69 kg/m  Body mass index is 60.69 kg/m. Gen: Afebrile. No acute distress. Nontoxic in appearance, well developed, well nourished.  Morbidly obese. HENT: AT. [place]. MMM Eyes:Pupils Equal Round Reactive to light, Extraocular movements intact,  Conjunctiva without redness, discharge or icterus. Abd: Soft.  Obese. NTND. BS present.  No rebound or guarding.  Skin: NO rashes, purpura or petechiae.  Neuro:  Normal gait. PERLA. EOMi. Alert. Oriented x3  Male genitalia: not done exam declined by patient   No exam data present No results found. Results for orders placed or performed in visit on 11/17/17 (from the past 24 hour(s))  POCT Urinalysis Dipstick (Automated)     Status: Abnormal   Collection Time: 11/17/17  9:50 AM  Result Value Ref Range   Color, UA yellow    Clarity, UA clear    Glucose, UA Negative Negative   Bilirubin, UA negative    Ketones, UA negative    Spec Grav, UA >=1.030 (A) 1.010 - 1.025   Blood, UA negative    pH, UA 5.5 5.0 - 8.0   Protein, UA Negative Negative   Urobilinogen, UA 0.2 0.2 or 1.0 E.U./dL   Nitrite, UA negative    Leukocytes, UA Negative Negative    Assessment/Plan: Bobby [name] is a [age] y.o. male present for OV for  Potential exposure to STD/dysuria -  poct urine today is negative.  Sent for cytology and culture to be  complete.  STD panel completed today. - POCT Urinalysis Dipstick (Automated) - Urine cytology ancillary only - RPR - HIV antibody (with reflex) - HSV(herpes smplx)abs-1+2(IgG+IgM)-bld - Urine Culture -Keflex prescribed to be started only if symptoms worsen over the weekend or we call with a positive urine culture. -Follow-up dependent upon lab results.  Reviewed expectations re: course of current medical issues.  Discussed self-management of symptoms.  Outlined signs and symptoms indicating need for more acute intervention.  Patient verbalized understanding and all questions were answered.  Patient received an After-Visit Summary.    Orders Placed This Encounter  Procedures  . POCT Urinalysis Dipstick (Automated)     Note is dictated utilizing voice recognition software. Although note has been proof read prior to signing, occasional typographical errors still can be missed. If any questions arise, please do not hesitate to call for verification.   electronically signed by:  Felix Pacini, DO  [facility] [place]

## 2017-11-18 ENCOUNTER — Encounter: Payer: Self-pay | Admitting: Family Medicine

## 2017-11-18 LAB — RPR: RPR: NONREACTIVE

## 2017-11-18 LAB — URINE CYTOLOGY ANCILLARY ONLY
Chlamydia: NEGATIVE
Neisseria Gonorrhea: NEGATIVE

## 2017-11-18 LAB — HIV ANTIBODY (ROUTINE TESTING W REFLEX): HIV: NONREACTIVE

## 2017-11-19 LAB — HSV(HERPES SMPLX)ABS-I+II(IGG+IGM)-BLD: HSV 1 Glycoprotein G Ab, IgG: 39 index — ABNORMAL HIGH (ref 0.00–0.90)

## 2017-11-19 LAB — URINE CULTURE
MICRO NUMBER: 90684498
Result:: NO GROWTH
SPECIMEN QUALITY:: ADEQUATE

## 2017-11-20 ENCOUNTER — Other Ambulatory Visit: Payer: Self-pay | Admitting: Family Medicine

## 2017-11-20 DIAGNOSIS — F418 Other specified anxiety disorders: Secondary | ICD-10-CM

## 2017-11-21 ENCOUNTER — Telehealth: Payer: Self-pay | Admitting: Family Medicine

## 2017-11-21 ENCOUNTER — Other Ambulatory Visit: Payer: Self-pay

## 2017-11-21 DIAGNOSIS — F418 Other specified anxiety disorders: Secondary | ICD-10-CM

## 2017-11-21 LAB — URINE CYTOLOGY ANCILLARY ONLY: Candida vaginitis: NEGATIVE

## 2017-11-21 MED ORDER — CITALOPRAM HYDROBROMIDE 40 MG PO TABS: 40.0000 mg | ORAL_TABLET | Freq: Every day | ORAL | 0 refills | Status: DC

## 2017-11-21 NOTE — Telephone Encounter (Signed)
Patient notified and verbalized understanding. 

## 2017-11-21 NOTE — Telephone Encounter (Signed)
Left message for patient to return call.

## 2017-11-21 NOTE — Telephone Encounter (Signed)
Patient notified of need for an appointment for refills on Celexa.

## 2017-11-21 NOTE — Telephone Encounter (Signed)
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 This is not a new infection since the antibodies present are the type that only develop after months, not acute infection.

## 2017-11-22 ENCOUNTER — Encounter: Payer: Self-pay | Admitting: Family Medicine

## 2017-11-22 ENCOUNTER — Ambulatory Visit: Payer: 59 | Admitting: Family Medicine

## 2017-11-22 VITALS — BP 134/89 | HR 71 | Temp 98.5°F | Resp 20 | Ht 73.0 in | Wt >= 6400 oz

## 2017-11-22 DIAGNOSIS — F418 Other specified anxiety disorders: Secondary | ICD-10-CM

## 2017-11-22 DIAGNOSIS — R300 Dysuria: Secondary | ICD-10-CM | POA: Diagnosis not present

## 2017-11-22 MED ORDER — QUETIAPINE FUMARATE 50 MG PO TABS: ORAL_TABLET | ORAL | 0 refills | Status: DC

## 2017-11-22 MED ORDER — CITALOPRAM HYDROBROMIDE 40 MG PO TABS: 40.0000 mg | ORAL_TABLET | Freq: Every day | ORAL | 0 refills | Status: DC

## 2017-11-22 NOTE — Progress Notes (Signed)
Patient ID: Bobby [name], male   DOB: 02/18/1976, 42 y.o.   MRN: [id number]      Patient ID: Bobby [name], male  DOB: 08-23-1975, 43 y.o.   MRN: [id number] Chief Complaint  Patient presents with  . Depression  . Anxiety    Subjective:  Bobby [name] is a 42 y.o.  male present for follow up Dysuria: Patient has a history of hypospadias correction when he was 42 years old.  He was seen last week for discomfort within the meatus.  STD panel and urine culture/cytology was completed with all negative results.  Patient reports he is still having mild discomfort and is agreeable to urology referral.  Depression/anxiety:   Patient reports he has continued the Celexa 40 mg daily.  He does feel that he is in a place where he could use additional coverage.  He had been on Wellbutrin prior but that caused what he would believed was urethral spasms. Prior note:  Patient feels that his anxiety is controlled. He is currently taking Celexa 40 mg daily. He was having some difficulty with irritability and has started seeing a counselor who is working well with him. His irritability and his attention deficit. He knows he has to take that controller for his life, and he is hoping counseling is going to help steer in the right direction.    Depression screen [facility] 2/9 11/22/2017 11/22/2017 11/18/2017  Decreased Interest 2 0 0  Down, Depressed, Hopeless 2 - 0  PHQ - 2 Score 4 0 0  Altered sleeping 0 - -  Tired, decreased energy 2 - -  Change in appetite 2 - -  Feeling bad or failure about yourself  2 - -  Trouble concentrating 1 - -  Moving slowly or fidgety/restless 0 - -  Suicidal thoughts 0 - -  PHQ-9 Score 11 - -  Difficult doing work/chores Very difficult - -    GAD 7 : Generalized Anxiety Score 11/22/2017 12/01/2015  Nervous, Anxious, on Edge 2 3  Control/stop worrying 2 2  Worry too much - different things 1 3  Trouble relaxing - 3  Restless 2 3  Easily annoyed or irritable 2 3  Afraid - awful  might happen 0 3  Total GAD 7 Score - 20  Anxiety Difficulty - Very difficult    Recent Results (from the past 2160 hour(s))  POCT Urinalysis Dipstick (Automated)     Status: Abnormal   Collection Time: 09/14/17  1:58 PM  Result Value Ref Range   Color, UA yellow    Clarity, UA clear    Glucose, UA negative    Bilirubin, UA negative    Ketones, UA negative    Spec Grav, UA >=1.030 (A) 1.010 - 1.025   Blood, UA negative    pH, UA 6.0 5.0 - 8.0   Protein, UA negative    Urobilinogen, UA 0.2 0.2 or 1.0 E.U./dL   Nitrite, UA negative    Leukocytes, UA Negative Negative  Basic Metabolic Panel (BMET)     Status: Abnormal   Collection Time: 09/14/17  2:21 PM  Result Value Ref Range   Sodium 138 135 - 145 mEq/L   Potassium 4.1 3.5 - 5.1 mEq/L   Chloride 102 96 - 112 mEq/L   CO2 30 19 - 32 mEq/L   Glucose, Bld 126 (H) 70 - 99 mg/dL   BUN 13 6 - 23 mg/dL   Creatinine, Ser 0.86 0.40 - 1.50 mg/dL   Calcium  8.9 8.4 - 10.5 mg/dL   GFR 96.04 >54.09 mL/min  Urine cytology ancillary only     Status: None   Collection Time: 11/17/17 12:00 AM  Result Value Ref Range   Chlamydia Negative     Comment: Normal Reference Range - Negative   Neisseria gonorrhea Negative     Comment: Normal Reference Range - Negative  Urine cytology ancillary only     Status: None   Collection Time: 11/17/17 12:00 AM  Result Value Ref Range   Candida vaginitis Negative for Candida Vaginitis Microorganisms     Comment: Normal Reference Range - Negative  POCT Urinalysis Dipstick (Automated)     Status: Abnormal   Collection Time: 11/17/17  9:50 AM  Result Value Ref Range   Color, UA yellow    Clarity, UA clear    Glucose, UA Negative Negative   Bilirubin, UA negative    Ketones, UA negative    Spec Grav, UA >=1.030 (A) 1.010 - 1.025   Blood, UA negative    pH, UA 5.5 5.0 - 8.0   Protein, UA Negative Negative   Urobilinogen, UA 0.2 0.2 or 1.0 E.U./dL   Nitrite, UA negative    Leukocytes, UA Negative  Negative  Urine Culture     Status: None   Collection Time: 11/17/17 10:17 AM  Result Value Ref Range   MICRO NUMBER: [id number]    SPECIMEN QUALITY: ADEQUATE    Sample Source NOT GIVEN    STATUS: FINAL    Result: No Growth   RPR     Status: None   Collection Time: 11/17/17 10:29 AM  Result Value Ref Range   RPR Ser Ql NON-REACTIVE NON-REACTI  HIV antibody (with reflex)     Status: None   Collection Time: 11/17/17 10:29 AM  Result Value Ref Range   HIV 1&2 Ab, 4th Generation NON-REACTIVE NON-REACTI    Comment: HIV-1 antigen and HIV-1/HIV-2 antibodies were not detected. There is no laboratory evidence of HIV infection. Marland Kitchen PLEASE NOTE: This information has been disclosed to you from records whose confidentiality may be protected by state law.  If your state requires such protection, then the state law prohibits you from making any further disclosure of the information without the specific written consent of the person to whom it pertains, or as otherwise permitted by law. A general authorization for the release of medical or other information is NOT sufficient for this purpose. . For additional information please refer to http://education.quest[employer].com/faq/FAQ106 (This link is being provided for informational/ educational purposes only.) . Marland Kitchen The performance of this assay has not been clinically validated in patients less than 42 years old. .   HSV(herpes smplx)abs-1+2(IgG+IgM)-bld     Status: Abnormal   Collection Time: 11/17/17 10:29 AM  Result Value Ref Range   HSVI/II Comb IgM <0.91 0.00 - 0.90 Ratio    Comment:                                  Negative        <0.91                                  Equivocal 0.91 - 1.09  Positive        >1.09    HSV 1 Glycoprotein G Ab, IgG 39.00 (H) 0.00 - 0.90 index    Comment:                                  Negative        <0.91                                  Equivocal 0.91 - 1.09                                   Positive        >1.09  Note: Negative indicates no antibodies detected to  HSV-1. Equivocal may suggest early infection.  If  clinically appropriate, retest at later date. Positive  indicates antibodies detected to HSV-1.    HSV 2 IgG, Type Spec <0.91 0.00 - 0.90 index    Comment:                                  Negative        <0.91                                  Equivocal 0.91 - 1.09                                  Positive        >1.09  Note: Negative indicates no antibodies detected to  HSV-2. Equivocal may suggest early infection.  If  clinically appropriate, retest at later date. Positive  indicates antibodies detected to HSV-2.      Past Medical History:  Diagnosis Date  . ADD (attention deficit disorder with hyperactivity)    adult  . Anal sphincter incontinence   . Anxiety   . Depression   . Depression with anxiety 04/12/2011  . Fatigue 04/08/2011  . GERD (gastroesophageal reflux disease)   . History of hypotestosteronemia 04/13/2011  . Hyperlipidemia   . Inclusion cyst 07/08/2010   Qualifier: Diagnosis of  By: Abner Greenspan MD, Misty Stanley    . Lumbar back pain    with radiculopathy  . 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 Obesity    morbid  . OSA (obstructive sleep apnea)    cpap setting of 14  . Paresthesia of skin 04/12/2011  . Paronychia 10/13/2011  . Rectal bleeding 2017  . Thyroid disease    Hypothyroid  . Tremor    idiopathic  . Vitamin B 12 deficiency 04/13/2011   Allergies  Allergen Reactions  . Adhesive [Tape] Itching and Rash  . Wellbutrin [Bupropion] Other (See Comments)    Urethral symptoms   Past Surgical History:  Procedure Laterality Date  . COLONOSCOPY WITH PROPOFOL N/A 09/17/2016   Procedure: COLONOSCOPY WITH PROPOFOL;  Surgeon: Ruffin Frederick, MD;  Location: [facility];  Service: Gastroenterology;  Laterality: N/A;  . CYSTECTOMY  10/2011   neck  . HYPOSPADIAS CORRECTION  1979  . wisdom teeth exactraction  age 11    Family History  Problem Relation Age of Onset  .  Hypertension Mother   . Obesity Mother   . Cardiomyopathy Mother   . Arthritis Father   . Gout Father   . Hyperlipidemia Father   . Hypertension Father   . Cleft palate Father   . Other Father        disassociative fugue  . Obesity Father   . ADD / ADHD Daughter        ADHD  . Coronary artery disease Maternal Grandmother        s/p multiple MI's first one in late 7230's  . Other Maternal Grandmother        CHF  . Cancer Maternal Grandmother        breast  . Other Maternal Grandfather        Essential tremors  . Cancer Maternal Grandfather        spinal/ smoker/brain  . Leukemia Paternal Grandmother   . Cancer Paternal Grandmother        leukemia  . Atrial fibrillation Paternal Grandfather [id number]  . Hypertension Paternal Grandfather   . Other Paternal Art gallery managerGrandfather        pacer/defibrillator  . Cancer Maternal Uncle        colon   Social History   Socioeconomic History  . Marital status: Married    Spouse name: Not on file  . Number of children: Not on file  . Years of education: Not on file  . Highest education level: Not on file  Occupational History  . Not on file  Social Needs  . Financial resource strain: Not on file  . Food insecurity:    Worry: Not on file    Inability: Not on file  . Transportation needs:    Medical: Not on file    Non-medical: Not on file  Tobacco Use  . Smoking status: Former Games developermoker  . Smokeless tobacco: Never Used  Substance and Sexual Activity  . Alcohol use: No    Comment: Rare - 1-2x/year  . Drug use: No  . Sexual activity: Yes  Lifestyle  . Physical activity:    Days per week: Not on file    Minutes per session: Not on file  . Stress: Not on file  Relationships  . Social connections:    Talks on phone: Not on file    Gets together: Not on file    Attends religious service: Not on file    Active member of club or organization: Not on file    Attends meetings of clubs or  organizations: Not on file    Relationship status: Not on file  . Intimate partner violence:    Fear of current or ex partner: Not on file    Emotionally abused: Not on file    Physically abused: Not on file    Forced sexual activity: Not on file  Other Topics Concern  . Not on file  Social History Narrative  . Not on file     ROS: 14 pt review of systems performed and negative (unless mentioned in an HPI)  Objective: BP 134/89 (BP Location: Right Arm, Patient Position: Sitting, Cuff Size: Large)   Pulse 71   Temp 98.5 F (36.9 C)   Resp 20   Ht 6\' 1"  (1.854 m)   Wt (!) 452 lb (205 kg)   SpO2 97%   BMI 59.63 kg/m  Gen: Afebrile. No acute distress. Nontoxic in appearance, well-developed, well-nourished, morbidly obese Caucasian male. HENT: AT. [place].  MMM.  Eyes:Pupils Equal Round Reactive to light,  Extraocular movements intact,  Conjunctiva without redness, discharge or icterus. CV: RRR no murmur, no edema Chest: CTAB, no wheeze or crackles Abd: Soft. Morbidly obese. NTND. BS present.  Neuro: Normal gait. PERLA. EOMi. Alert. Oriented x3   Psych: Normal affect, dress and demeanor. Normal speech. Normal thought content and judgment.  Assessment/plan: Bobby [name] is a 42 y.o. male present for annual exam and followup on fatigue: Depression with anxiety - Continue Celexa 40 mg daily. Refills provided today. -Start Seroquel taper to 50 mg daily. -Referral to psychology, Dr. Dewayne Hatch placed today. - Follow-up  in 1 months as long as doing well, sooner if needed.  Dysuria:  Referral to urology for further work-up on his complaints.  Does have a history of hypospadias correction at age 74.  Return in about 3 months (around 02/22/2018).  Electronically signed by: Felix Pacini, DO [facility] [place]

## 2017-11-22 NOTE — Patient Instructions (Signed)
I have referred you to psychology and urology.  Called in celexa and seroquel for anxiety. Taper seroquel as instructions state if needed after 7 days Follow up in 30 days.     Generalized Anxiety Disorder, Adult Generalized anxiety disorder (GAD) is a mental health disorder. People with this condition constantly worry about everyday events. Unlike normal anxiety, worry related to GAD is not triggered by a specific event. These worries also do not fade or get better with time. GAD interferes with life functions, including relationships, work, and school. GAD can vary from mild to severe. People with severe GAD can have intense waves of anxiety with physical symptoms (panic attacks). What are the causes? The exact cause of GAD is not known. What increases the risk? This condition is more likely to develop in:  Women.  People who have a family history of anxiety disorders.  People who are very shy.  People who experience very stressful life events, such as the death of a loved one.  People who have a very stressful family environment.  What are the signs or symptoms? People with GAD often worry excessively about many things in their lives, such as their health and family. They may also be overly concerned about:  Doing well at work.  Being on time.  Natural disasters.  Friendships.  Physical symptoms of GAD include:  Fatigue.  Muscle tension or having muscle twitches.  Trembling or feeling shaky.  Being easily startled.  Feeling like your heart is pounding or racing.  Feeling out of breath or like you cannot take a deep breath.  Having trouble falling asleep or staying asleep.  Sweating.  Nausea, diarrhea, or irritable bowel syndrome (IBS).  Headaches.  Trouble concentrating or remembering facts.  Restlessness.  Irritability.  How is this diagnosed? Your health care provider can diagnose GAD based on your symptoms and medical history. You will also have  a physical exam. The health care provider will ask specific questions about your symptoms, including how severe they are, when they started, and if they come and go. Your health care provider may ask you about your use of alcohol or drugs, including prescription medicines. Your health care provider may refer you to a mental health specialist for further evaluation. Your health care provider will do a thorough examination and may perform additional tests to rule out other possible causes of your symptoms. To be diagnosed with GAD, a person must have anxiety that:  Is out of his or her control.  Affects several different aspects of his or her life, such as work and relationships.  Causes distress that makes him or her unable to take part in normal activities.  Includes at least three physical symptoms of GAD, such as restlessness, fatigue, trouble concentrating, irritability, muscle tension, or sleep problems.  Before your health care provider can confirm a diagnosis of GAD, these symptoms must be present more days than they are not, and they must last for six months or longer. How is this treated? The following therapies are usually used to treat GAD:  Medicine. Antidepressant medicine is usually prescribed for long-term daily control. Antianxiety medicines may be added in severe cases, especially when panic attacks occur.  Talk therapy (psychotherapy). Certain types of talk therapy can be helpful in treating GAD by providing support, education, and guidance. Options include: ? Cognitive behavioral therapy (CBT). People learn coping skills and techniques to ease their anxiety. They learn to identify unrealistic or negative thoughts and behaviors and to replace  them with positive ones. ? Acceptance and commitment therapy (ACT). This treatment teaches people how to be mindful as a way to cope with unwanted thoughts and feelings. ? Biofeedback. This process trains you to manage your body's response  (physiological response) through breathing techniques and relaxation methods. You will work with a therapist while machines are used to monitor your physical symptoms.  Stress management techniques. These include yoga, meditation, and exercise.  A mental health specialist can help determine which treatment is best for you. Some people see improvement with one type of therapy. However, other people require a combination of therapies. Follow these instructions at home:  Take over-the-counter and prescription medicines only as told by your health care provider.  Try to maintain a normal routine.  Try to anticipate stressful situations and allow extra time to manage them.  Practice any stress management or self-calming techniques as taught by your health care provider.  Do not punish yourself for setbacks or for not making progress.  Try to recognize your accomplishments, even if they are small.  Keep all follow-up visits as told by your health care provider. This is important. Contact a health care provider if:  Your symptoms do not get better.  Your symptoms get worse.  You have signs of depression, such as: ? A persistently sad, cranky, or irritable mood. ? Loss of enjoyment in activities that used to bring you joy. ? Change in weight or eating. ? Changes in sleeping habits. ? Avoiding friends or family members. ? Loss of energy for normal tasks. ? Feelings of guilt or worthlessness. Get help right away if:  You have serious thoughts about hurting yourself or others. If you ever feel like you may hurt yourself or others, or have thoughts about taking your own life, get help right away. You can go to your nearest emergency department or call:  Your local emergency services (911 in the [place]  A suicide crisis helpline, such as the National Suicide Prevention Lifeline at 802-544-05301-404-852-2456. This is open 24 hours a day.  Summary  Generalized anxiety disorder (GAD) is a mental  health disorder that involves worry that is not triggered by a specific event.  People with GAD often worry excessively about many things in their lives, such as their health and family.  GAD may cause physical symptoms such as restlessness, trouble concentrating, sleep problems, frequent sweating, nausea, diarrhea, headaches, and trembling or muscle twitching.  A mental health specialist can help determine which treatment is best for you. Some people see improvement with one type of therapy. However, other people require a combination of therapies. This information is not intended to replace advice given to you by your health care provider. Make sure you discuss any questions you have with your health care provider. Document Released: 09/25/2012 Document Revised: 04/20/2016 Document Reviewed: 04/20/2016 Elsevier Interactive Patient Education  [employer]2018 [employer]

## 2017-12-21 ENCOUNTER — Ambulatory Visit: Payer: 59 | Admitting: Family Medicine

## 2017-12-21 DIAGNOSIS — Z0289 Encounter for other administrative examinations: Secondary | ICD-10-CM

## 2018-01-16 ENCOUNTER — Ambulatory Visit: Payer: 59 | Admitting: Clinical

## 2018-03-24 ENCOUNTER — Other Ambulatory Visit: Payer: Self-pay | Admitting: Family Medicine

## 2018-03-24 DIAGNOSIS — F418 Other specified anxiety disorders: Secondary | ICD-10-CM

## 2018-04-27 ENCOUNTER — Other Ambulatory Visit: Payer: Self-pay | Admitting: Family Medicine

## 2018-04-27 DIAGNOSIS — F418 Other specified anxiety disorders: Secondary | ICD-10-CM

## 2018-05-08 ENCOUNTER — Encounter: Payer: Self-pay | Admitting: Family Medicine

## 2018-05-08 ENCOUNTER — Ambulatory Visit: Payer: Self-pay | Admitting: Family Medicine

## 2018-05-08 DIAGNOSIS — Z0289 Encounter for other administrative examinations: Secondary | ICD-10-CM

## 2018-05-30 ENCOUNTER — Other Ambulatory Visit: Payer: Self-pay | Admitting: Family Medicine

## 2018-05-30 DIAGNOSIS — F418 Other specified anxiety disorders: Secondary | ICD-10-CM

## 2018-06-02 ENCOUNTER — Other Ambulatory Visit: Payer: Self-pay | Admitting: Family Medicine

## 2018-06-02 DIAGNOSIS — F418 Other specified anxiety disorders: Secondary | ICD-10-CM

## 2018-06-02 MED ORDER — CITALOPRAM HYDROBROMIDE 40 MG PO TABS: ORAL_TABLET | ORAL | 0 refills | Status: DC

## 2018-06-02 NOTE — Telephone Encounter (Signed)
Appointment scheduled 06/19/18.  Courtesy refill

## 2018-06-02 NOTE — Telephone Encounter (Signed)
Copied from CRM [id number]. Topic: Quick Communication - Rx Refill/Question >> Jun 02, 2018  4:46 PM Jens SomMedley, Jennifer A wrote: Medication: citalopram (CELEXA) 40 MG tablet [[id number]] Appt Scheduled 06/19/18  Has the patient contacted their pharmacy? Yes  (Agent: If no, request that the patient contact the pharmacy for the refill.) (Agent: If yes, when and what did the pharmacy advise?)  Preferred Pharmacy (with phone number or street name): [facility] [address] - [place], [place] - [id number] [phone] (Phone) [phone] (Fax)    Agent: Please be advised that RX refills may take up to 3 business days. We ask that you follow-up with your pharmacy.

## 2018-06-19 ENCOUNTER — Other Ambulatory Visit: Payer: Self-pay

## 2018-06-19 ENCOUNTER — Encounter: Payer: Self-pay | Admitting: Family Medicine

## 2018-06-19 ENCOUNTER — Ambulatory Visit (INDEPENDENT_AMBULATORY_CARE_PROVIDER_SITE_OTHER): Payer: PRIVATE HEALTH INSURANCE | Admitting: Family Medicine

## 2018-06-19 VITALS — BP 123/94 | HR 66 | Temp 98.5°F | Resp 16 | Ht 73.0 in | Wt >= 6400 oz

## 2018-06-19 DIAGNOSIS — E538 Deficiency of other specified B group vitamins: Secondary | ICD-10-CM

## 2018-06-19 DIAGNOSIS — E559 Vitamin D deficiency, unspecified: Secondary | ICD-10-CM | POA: Diagnosis not present

## 2018-06-19 DIAGNOSIS — R062 Wheezing: Secondary | ICD-10-CM

## 2018-06-19 DIAGNOSIS — Z23 Encounter for immunization: Secondary | ICD-10-CM

## 2018-06-19 DIAGNOSIS — Z5181 Encounter for therapeutic drug level monitoring: Secondary | ICD-10-CM

## 2018-06-19 DIAGNOSIS — R5383 Other fatigue: Secondary | ICD-10-CM | POA: Diagnosis not present

## 2018-06-19 DIAGNOSIS — E230 Hypopituitarism: Secondary | ICD-10-CM

## 2018-06-19 DIAGNOSIS — F418 Other specified anxiety disorders: Secondary | ICD-10-CM | POA: Diagnosis not present

## 2018-06-19 DIAGNOSIS — G4733 Obstructive sleep apnea (adult) (pediatric): Secondary | ICD-10-CM

## 2018-06-19 DIAGNOSIS — Z125 Encounter for screening for malignant neoplasm of prostate: Secondary | ICD-10-CM

## 2018-06-19 DIAGNOSIS — Z79890 Hormone replacement therapy (postmenopausal): Secondary | ICD-10-CM

## 2018-06-19 LAB — PSA: PSA: 0.45 ng/mL (ref 0.10–4.00)

## 2018-06-19 LAB — CBC
HCT: 42.7 % (ref 39.0–52.0)
Hemoglobin: 13.9 g/dL (ref 13.0–17.0)
MCHC: 32.6 g/dL (ref 30.0–36.0)
MCV: 86.5 fl (ref 78.0–100.0)
PLATELETS: 287 10*3/uL (ref 150.0–400.0)
RBC: 4.94 Mil/uL (ref 4.22–5.81)
RDW: 15.5 % (ref 11.5–15.5)
WBC: 8 10*3/uL (ref 4.0–10.5)

## 2018-06-19 LAB — TSH: TSH: 3.78 u[IU]/mL (ref 0.35–4.50)

## 2018-06-19 LAB — TESTOSTERONE: TESTOSTERONE: 138.13 ng/dL — AB (ref 300.00–890.00)

## 2018-06-19 LAB — VITAMIN D 25 HYDROXY (VIT D DEFICIENCY, FRACTURES): VITD: 12.21 ng/mL — AB (ref 30.00–100.00)

## 2018-06-19 LAB — T4, FREE: FREE T4: 0.77 ng/dL (ref 0.60–1.60)

## 2018-06-19 MED ORDER — ALBUTEROL SULFATE HFA 108 (90 BASE) MCG/ACT IN AERS: INHALATION_SPRAY | RESPIRATORY_TRACT | 2 refills | Status: DC

## 2018-06-19 MED ORDER — CYANOCOBALAMIN 1000 MCG/ML IJ SOLN
1000.0000 ug | Freq: Once | INTRAMUSCULAR | Status: AC
  Administered 2018-06-19: 1000 ug via INTRAMUSCULAR

## 2018-06-19 MED ORDER — CITALOPRAM HYDROBROMIDE 40 MG PO TABS: ORAL_TABLET | ORAL | 1 refills | Status: DC

## 2018-06-19 NOTE — Patient Instructions (Addendum)
We will collect labs today.  Restart b12 injections every 14 days.  Trial of albuterol inhaler 2 puffs about 20 minutes before starting exercise. If this improves your symptoms this could be exercise induce asthma.   Ease into exercise- try water aerobics.   Once I get your labs we will discuss testosterone and vit d restart etc.   Please help us help you:  We are honored [facility]. Below you will find basic instructions that you may need to access in the future. Please help us help you by reading the instructions, which cover many of the frequent questions we experience.   Prescription refills and request:  -In order to allow more efficient response time, please call your pharmacy for all refills. They will forward the request electronically to us. This allows for the quickest possible response. Request left on a nurse line can take longer to refill, since these are checked as time allows between office patients and other phone calls.  - refill request can take up to 3-5 working days to complete.  - If request is sent electronically and request is appropiate, it is usually completed in 1-2 business days.  - all patients will need to be seen routinely for all chronic medical conditions requiring prescription medications (see follow-up below). If you are overdue for follow up on your condition, you will be asked to make an appointment and we will call in enough medication to cover you until your appointment (up to 30 days).  - all controlled substances will require a face to face visit to request/refill.  - if you desire your prescriptions to go through a new pharmacy, and have an active script at original pharmacy, you will need to call your pharmacy and have scripts transferred to new pharmacy. This is completed between the pharmacy locations and not by your provider.    Results: If any images or labs were ordered, it can take up to 1 week to  get results depending on the test ordered and the lab/facility running and resulting the test. - Normal or stable results, which do not need further discussion, may be released to your mychart immediately with attached note to you. A call may not be generated for normal results. Please make certain to sign up for mychart. If you have questions on how to activate your mychart you can call the front office.  - If your results need further discussion, our office will attempt to contact you via phone, and if unable to reach you after 2 attempts, we will release your abnormal result to your mychart with instructions.  - All results will be automatically released in mychart after 1 week.  - Your provider will provide you with explanation and instruction on all relevant material in your results. Please keep in mind, results and labs may appear confusing or abnormal to the untrained eye, but it does not mean they are actually abnormal for you personally. If you have any questions about your results that are not covered, or you desire more detailed explanation than what was provided, you should make an appointment with your provider to do so.   Our office handles many outgoing and incoming calls daily. If we have not contacted you within 1 week about your results, please check your mychart to see if there is a message first and if not, then contact our office.  In helping with this matter, you help decrease call volume, and  therefore allow [place] to be able to respond to patients needs more efficiently.   Acute office visits (sick visit):  An acute visit is intended for a new problem and are scheduled in shorter time slots to allow schedule openings for patients with new problems. This is the appropriate visit to discuss a new problem. Problems will not be addressed by phone call or Echart message. Appointment is needed if requesting treatment. In order to provide you with excellent quality medical care with proper time  for you to explain your problem, have an exam and receive treatment with instructions, these appointments should be limited to one new problem per visit. If you experience a new problem, in which you desire to be addressed, please make an acute office visit, we save openings on the schedule to accommodate you. Please do not save your new problem for any other type of visit, let us take care of it properly and quickly for you.   Follow up visits:  Depending on your condition(s) your provider will need to see you routinely in order to provide you with quality care and prescribe medication(s). Most chronic conditions (Example: hypertension, Diabetes, depression/anxiety... etc), require visits a couple times a year. Your provider will instruct you on proper follow up for your personal medical conditions and history. Please make certain to make follow up appointments for your condition as instructed. Failing to do so could result in lapse in your medication treatment/refills. If you request a refill, and are overdue to be seen on a condition, we will always provide you with a 30 day script (once) to allow you time to schedule.    Medicare wellness (well visit): - we have a wonderful Nurse Selena Batten), that will meet with you and provide you will yearly medicare wellness visits. These visits should occur yearly (can not be scheduled less than 1 calendar year apart) and cover preventive health, immunizations, advance directives and screenings you are entitled to yearly through your medicare benefits. Do not miss out on your entitled benefits, this is when medicare will pay for these benefits to be ordered for you.  These are strongly encouraged by your provider and is the appropriate type of visit to make certain you are up to date with all preventive health benefits. If you have not had your medicare wellness exam in the last 12 months, please make certain to schedule one by calling the office and schedule your medicare  wellness with Selena Batten as soon as possible.   Yearly physical (well visit):  - Adults are recommended to be seen yearly for physicals. Check with your insurance and date of your last physical, most insurances require one calendar year between physicals. Physicals include all preventive health topics, screenings, medical exam and labs that are appropriate for gender/age and history. You may have fasting labs needed at this visit. This is a well visit (not a sick visit), new problems should not be covered during this visit (see acute visit).  - Pediatric patients are seen more frequently when they are younger. Your provider will advise you on well child visit timing that is appropriate for your their age. - This is not a medicare wellness visit. Medicare wellness exams do not have an exam portion to the visit. Some medicare companies allow for a physical, some do not allow a yearly physical. If your medicare allows a yearly physical you can schedule the medicare wellness with our nurse Selena Batten and have your physical with your provider after, on the same  day. Please check with insurance for your full benefits.   Late Policy/No Shows:  - all new patients should arrive 15-30 minutes earlier than appointment to allow us time  to  obtain all personal demographics,  insurance information and for you to complete office paperwork. - All established patients should arrive 10-15 minutes earlier than appointment time to update all information and be checked in .  - In our best efforts to run on time, if you are late for your appointment you will be asked to either reschedule or if able, we will work you back into the schedule. There will be a wait time to work you back in the schedule,  depending on availability.  - If you are unable to make it to your appointment as scheduled, please call 24 hours ahead of time to allow us to fill the time slot with someone else who needs to be seen. If you do not cancel your appointment  ahead of time, you may be charged a no show fee.

## 2018-06-19 NOTE — Progress Notes (Signed)
Patient ID: Bobby [name], male   DOB: 01/26/1976, 43 y.o.   MRN: [id number]      Patient ID: Bobby [name], male  DOB: 04-06-1976, 43 y.o.   MRN: [id number] Chief Complaint  Patient presents with  . Depression  . Anxiety    Subjective:  Bobby [name] is a 43 y.o.  male present for follow up Depression/anxiety:   Patient reports he has continued the Celexa 40 mg daily and is doing well on medication. .he has switched jobs since being seen last and his stress level is much less than prior.  He had been on Wellbutrin prior but that caused what he would believed was urethral spasms. Seroquel low dose made him too sleepy. He has seen a counselor in the past.   Fatigue/obesity/Hypogonadotropic hypogonadism (HCC)/B12 deficiency/Vitamin D deficiency Long standing h/o of above. He had been on testosterone injections, b12 injections and taking routine vit d prior to change in insurance. He would like to restart regimen today. He reports rather extreme fatigue. He has started exercising and watching his diet.  Patient presents for hypogonadism with testosterone supplementation. Patient denies personal or family history of prostate cancer, male breast cancer, severe sleep apnea (on CPAP- well controlled), severe BPH or severe chronic cardiac failure.  CMP: collected today CBC: collected today Testosterone panel : historically  < 200 without supplement. Retest today. Responded well to testosterone cypionate inj im  200 mg every 14 days in the past PSA: normal 1 year ago- test today.  Lipids: test fasting at first inj date,, then every 6 months  Obstructive sleep apnea Compliant with CPAP.   Wheezing New. With shortness of breath and cough when exercising. He is obese and out of shape. He reports no chest pain or dizziness. He states his exercise tolerance is down, which he expected, but his chest feels tight- like winded or wheezing when he exercises.   Depression screen [facility] 2/9 06/19/2018  11/22/2017 11/22/2017  Decreased Interest 0 2 0  Down, Depressed, Hopeless 1 2 -  PHQ - 2 Score 1 4 0  Altered sleeping 1 0 -  Tired, decreased energy 2 2 -  Change in appetite 0 2 -  Feeling bad or failure about yourself  0 2 -  Trouble concentrating 0 1 -  Moving slowly or fidgety/restless 0 0 -  Suicidal thoughts 0 0 -  PHQ-9 Score 4 11 -  Difficult doing work/chores Somewhat difficult Very difficult -    GAD 7 : Generalized Anxiety Score 06/19/2018 11/22/2017 12/01/2015  Nervous, Anxious, on Edge 1 2 3   Control/stop worrying 1 2 2   Worry too much - different things 1 1 3   Trouble relaxing 0 - 3  Restless 0 2 3  Easily annoyed or irritable 0 2 3  Afraid - awful might happen 0 0 3  Total GAD 7 Score 3 - 20  Anxiety Difficulty Somewhat difficult - Very difficult    No results found for this or any previous visit (from the past 2160 hour(s)).   Past Medical History:  Diagnosis Date  . ADD (attention deficit disorder with hyperactivity)    adult  . Anal sphincter incontinence   . Anxiety   . Depression   . Depression with anxiety 04/12/2011  . Fatigue 04/08/2011  . GERD (gastroesophageal reflux disease)   . History of hypotestosteronemia 04/13/2011  . Hyperlipidemia   . Inclusion cyst 07/08/2010   Qualifier: Diagnosis of  By: Abner Greenspan MD, Misty Stanley    .  Lumbar back pain    with radiculopathy  . 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 Obesity    morbid  . OSA (obstructive sleep apnea)    cpap setting of 14  . Paresthesia of skin 04/12/2011  . Paronychia 10/13/2011  . Rectal bleeding 2017  . Thyroid disease    Hypothyroid  . Tremor    idiopathic  . Vitamin B 12 deficiency 04/13/2011   Allergies  Allergen Reactions  . Adhesive [Tape] Itching and Rash  . Wellbutrin [Bupropion] Other (See Comments)    Urethral symptoms   Past Surgical History:  Procedure Laterality Date  . COLONOSCOPY WITH PROPOFOL N/A 09/17/2016   Procedure: COLONOSCOPY WITH PROPOFOL;  Surgeon: Ruffin Frederick, MD;  Location: [facility];  Service: Gastroenterology;  Laterality: N/A;  . CYSTECTOMY  10/2011   neck  . HYPOSPADIAS CORRECTION  1979  . wisdom teeth exactraction  age 23   Family History  Problem Relation Age of Onset  . Hypertension Mother   . Obesity Mother   . Cardiomyopathy Mother   . Arthritis Father   . Gout Father   . Hyperlipidemia Father   . Hypertension Father   . Cleft palate Father   . Other Father        disassociative fugue  . Obesity Father   . ADD / ADHD Daughter        ADHD  . Coronary artery disease Maternal Grandmother        s/p multiple MI's first one in late 27's  . Other Maternal Grandmother        CHF  . Cancer Maternal Grandmother        breast  . Other Maternal Grandfather        Essential tremors  . Cancer Maternal Grandfather        spinal/ smoker/brain  . Leukemia Paternal Grandmother   . Cancer Paternal Grandmother        leukemia  . Atrial fibrillation Paternal Grandfather 80  . Hypertension Paternal Grandfather   . Other Paternal Art gallery manager  . Cancer Maternal Uncle        colon   Social History   Socioeconomic History  . Marital status: Married    Spouse name: Not on file  . Number of children: Not on file  . Years of education: Not on file  . Highest education level: Not on file  Occupational History  . Not on file  Social Needs  . Financial resource strain: Not on file  . Food insecurity:    Worry: Not on file    Inability: Not on file  . Transportation needs:    Medical: Not on file    Non-medical: Not on file  Tobacco Use  . Smoking status: Former Games developer  . Smokeless tobacco: Never Used  Substance and Sexual Activity  . Alcohol use: No    Comment: Rare - 1-2x/year  . Drug use: No  . Sexual activity: Yes  Lifestyle  . Physical activity:    Days per week: Not on file    Minutes per session: Not on file  . Stress: Not on file  Relationships  . Social connections:     Talks on phone: Not on file    Gets together: Not on file    Attends religious service: Not on file    Active member of club or organization: Not on file    Attends meetings of clubs or organizations: Not  on file    Relationship status: Not on file  . Intimate partner violence:    Fear of current or ex partner: Not on file    Emotionally abused: Not on file    Physically abused: Not on file    Forced sexual activity: Not on file  Other Topics Concern  . Not on file  Social History Narrative  . Not on file     ROS: 14 pt review of systems performed and negative (unless mentioned in an HPI)  Objective: BP (!) 123/94   Pulse 66   Temp 98.5 F (36.9 C) (Oral)   Resp 16   Ht 6\' 1"  (1.854 m)   Wt (!) 467 lb (211.8 kg)   SpO2 95%   BMI 61.61 kg/m  Gen: Afebrile. No acute distress. Nontoxic in appearance, morbidly obese.  HENT: AT. Glide.MMM.  Eyes:Pupils Equal Round Reactive to light, Extraocular movements intact,  Conjunctiva without redness, discharge or icterus. Neck/lymp/endocrine: Supple,no  lymphadenopathy, no thyromegaly CV: RRR no murmur, no edema, +2/4 P posterior tibialis pulses Chest: CTAB, no wheeze or crackles Abd: Soft. obese.  BS present.  Skin: no rashes, purpura or petechiae.  Neuro:  Normal gait. PERLA. EOMi. Alert. Oriented.  Psych: Normal affect, dress and demeanor. Normal speech. Normal thought content and judgment..   Assessment/plan: Valentino NoseWilliam P Filley is a 43 y.o. male present for Depression with anxiety - stable.  - Continue Celexa 40 mg daily. refills provided today. -Referral to psychology, Dr. Dewayne HatchMendelson had been placed- no longer going.  - f/u 6 mos.   Fatigue/obesity/Hypogonadotropic hypogonadism (HCC)/B12 deficiency/Vitamin D deficiency Testosterone dose: testosterone cypionate 200 mg IM every 14 days with a B12 injection every 2 weeks as well (same day).  Supplementing testosterone: - PSA/cbc/testoterone levels prior to start of medication, then  repeat after 3, 6, 12 months of use. Long term monitoring every 6 months while on medication (except - PSA annually). - Schedule 5 injections every other week/nurse visit. Then labs to occur 1 week before 6th injection at 8 am  Follow up with provider/doctor appointment 1 week after lab, on the day next injection would be needed. After dose is established stable- follow up will be extended to q mos.  - start vit d 50000u weekly- prescribed.  - CBC - TSH - Thyroid peroxidase antibody - T4, free - Vitamin D (25 hydroxy) - Testosterone  Obstructive sleep apnea/obesity Compliant with CPAP.  Diet and exercise.   Wheezing - trial of albuterol 1-2 puffs 20 minutes prior to exercise.  - He needs to take exercise slow and steady to start- he is aware if he has chest pain or dizziness he needs to be seen emergently.  - If albuterol is not helpful- may also need to refer to cardiology.   Flu shot provided today.   F/u 3 months  > 25 minutes spent with patient, >50% of time spent face to face   Electronically signed by: Felix Pacinienee Annalese Stiner, DO [facility]

## 2018-06-20 ENCOUNTER — Telehealth: Payer: Self-pay | Admitting: Family Medicine

## 2018-06-20 ENCOUNTER — Encounter: Payer: Self-pay | Admitting: Family Medicine

## 2018-06-20 ENCOUNTER — Other Ambulatory Visit (INDEPENDENT_AMBULATORY_CARE_PROVIDER_SITE_OTHER): Payer: PRIVATE HEALTH INSURANCE

## 2018-06-20 DIAGNOSIS — Z5181 Encounter for therapeutic drug level monitoring: Secondary | ICD-10-CM | POA: Diagnosis not present

## 2018-06-20 DIAGNOSIS — Z79890 Hormone replacement therapy (postmenopausal): Secondary | ICD-10-CM

## 2018-06-20 DIAGNOSIS — E230 Hypopituitarism: Secondary | ICD-10-CM | POA: Diagnosis not present

## 2018-06-20 LAB — LDL CHOLESTEROL, DIRECT: Direct LDL: 147 mg/dL

## 2018-06-20 LAB — LIPID PANEL
CHOL/HDL RATIO: 7
Cholesterol: 237 mg/dL — ABNORMAL HIGH (ref 0–200)
HDL: 34.1 mg/dL — ABNORMAL LOW (ref >39.00)
NONHDL: 202.58
TRIGLYCERIDES: 253 mg/dL — AB (ref 0.0–149.0)
VLDL: 50.6 mg/dL — ABNORMAL HIGH (ref 0.0–40.0)

## 2018-06-20 LAB — THYROID PEROXIDASE ANTIBODY: Thyroperoxidase Ab SerPl-aCnc: <1 IU/mL (ref <9)

## 2018-06-20 MED ORDER — TESTOSTERONE CYPIONATE 200 MG/ML IM SOLN: 200.0000 mg | INTRAMUSCULAR | 1 refills | Status: DC

## 2018-06-20 MED ORDER — VITAMIN D (ERGOCALCIFEROL) 1.25 MG (50000 UNIT) PO CAPS: 50000.0000 [IU] | ORAL_CAPSULE | ORAL | 3 refills | Status: DC

## 2018-06-20 NOTE — Telephone Encounter (Signed)
Patient aware of results and recommendations.  He is scheduled for nurse visit for testosterone 06/26/2018.   Patient states that he was fasting yesterday when he came to office.   I will send add on form to [facility] to have lipid resulted.

## 2018-06-20 NOTE — Telephone Encounter (Signed)
Please inform patient the following information: - his vit d and testosterone was low. I have ordered vit d once weekly supplement- take with food. I have called in the testosterone supplement to restart by nurse visits. He is to bring his medicine in at his first appt for injection. He will start --> testosterone cypionate 200 mg IM every 14 days and a B12 injection every  14 days as well (same day).  - Schedule 5 injections every other week/nurse visit. Then labs to occur 1 week before 6th injection at 8-10 am and a provider appt 1 week after labs.  (schedule all of the above please)  Also on his first injection date--> please tell him to fast so we can get baseline cholesterol for him--> ordered placed future-- please put him on lab schedule as well.

## 2018-06-21 ENCOUNTER — Telehealth: Payer: Self-pay | Admitting: Family Medicine

## 2018-06-21 MED ORDER — ATORVASTATIN CALCIUM 20 MG PO TABS: 20.0000 mg | ORAL_TABLET | Freq: Every day | ORAL | 1 refills | Status: DC

## 2018-06-21 NOTE — Telephone Encounter (Signed)
LM for patient to call to discuss results.   CRM Created. Okay for P[facility] to discuss results with patient.

## 2018-06-21 NOTE — Telephone Encounter (Signed)
Pt returned call and lab message from his provider given to him with verbal understanding. He will start taking the statin and will follow up with making an appointment next week when he comes for his injection.

## 2018-06-21 NOTE — Telephone Encounter (Signed)
Please inform patient the following information: His cholesterol is higher than desired. With a total cholesterol of 237 (under 200 goal) and LDL 147 (below 130 goal-closer to 100 best) and triglycerides 253 (below 150 goal). Adding testosterone also has the potential of increasing his cholesterol levels. With his family history and his cholesterol levels, I would suggest starting a statin medication to help decrease cholesterol and provided CV protection. I have called this in to his pharmacy- we will retest his levels in 3 mos at his next appt (come fasting 6-9 hours).

## 2018-06-22 NOTE — Telephone Encounter (Signed)
Patient was given results by [facility].

## 2018-06-26 ENCOUNTER — Ambulatory Visit: Payer: Self-pay

## 2018-06-26 ENCOUNTER — Ambulatory Visit (INDEPENDENT_AMBULATORY_CARE_PROVIDER_SITE_OTHER): Payer: PRIVATE HEALTH INSURANCE | Admitting: Family Medicine

## 2018-06-26 ENCOUNTER — Telehealth: Payer: Self-pay | Admitting: Family Medicine

## 2018-06-26 DIAGNOSIS — E538 Deficiency of other specified B group vitamins: Secondary | ICD-10-CM | POA: Diagnosis not present

## 2018-06-26 MED ORDER — CYANOCOBALAMIN 1000 MCG/ML IJ SOLN
1000.0000 ug | Freq: Once | INTRAMUSCULAR | Status: AC
  Administered 2018-06-26: 1000 ug via INTRAMUSCULAR

## 2018-06-26 NOTE — Telephone Encounter (Signed)
Patient contacted office to find out about PA for testosterone.    I contacted [facility] pharmacy to find out if there was a PA for patient's testosterone and they said the PA was done at pharmacy and Denied.   Patient would have to pay out of pocket $239. 36 for 150 day supply of RX.    Patient contacted and advised to call his insurance to find out if there was an alternative or preferred testosterone rx they would like him to use first.    Awaiting patients return call.

## 2018-06-26 NOTE — Progress Notes (Addendum)
Bobby [name] is a 43 y.o. male presents to the office today for bi-weekly Vitamin B12 injection:  Vitamin B 12 injections, per physician's orders. Original order: 1.7.2020 - B12 injection every  14 days Vitamin B12 , IM was administered left arm today. Patient tolerated injection. Patient due for follow up labs/provider appt: No. Date due: n/a , appt made No Patient next injection due: 07/10/2018, appt made Yes  Faythe Ghee, CMA  Medical screening examination/treatment/procedure(s) were performed by non-physician practitioner and as supervising physician I was immediately available for consultation/collaboration.  I agree with above assessment and plan.  Electronically Signed by: Felix Pacini, DO [facility]

## 2018-07-03 ENCOUNTER — Ambulatory Visit: Payer: Self-pay

## 2018-10-15 ENCOUNTER — Encounter (HOSPITAL_COMMUNITY): Payer: Self-pay | Admitting: Emergency Medicine

## 2018-10-15 ENCOUNTER — Inpatient Hospital Stay (HOSPITAL_COMMUNITY)
Admission: EM | Admit: 2018-10-15 | Discharge: 2018-10-19 | DRG: 964 | Disposition: A | Discharge status: Rehabilitation Facility | Payer: Self-pay | Attending: General Surgery | Admitting: General Surgery

## 2018-10-15 ENCOUNTER — Emergency Department (HOSPITAL_COMMUNITY): Payer: Self-pay

## 2018-10-15 DIAGNOSIS — S069XAA Unspecified intracranial injury with loss of consciousness status unknown, initial encounter: Secondary | ICD-10-CM

## 2018-10-15 DIAGNOSIS — S270XXA Traumatic pneumothorax, initial encounter: Secondary | ICD-10-CM

## 2018-10-15 DIAGNOSIS — M25512 Pain in left shoulder: Secondary | ICD-10-CM | POA: Diagnosis present

## 2018-10-15 DIAGNOSIS — I609 Nontraumatic subarachnoid hemorrhage, unspecified: Secondary | ICD-10-CM

## 2018-10-15 DIAGNOSIS — D72829 Elevated white blood cell count, unspecified: Secondary | ICD-10-CM

## 2018-10-15 DIAGNOSIS — S066X9A Traumatic subarachnoid hemorrhage with loss of consciousness of unspecified duration, initial encounter: Principal | ICD-10-CM | POA: Diagnosis present

## 2018-10-15 DIAGNOSIS — Z6841 Body Mass Index (BMI) 40.0 and over, adult: Secondary | ICD-10-CM

## 2018-10-15 DIAGNOSIS — R40241 Glasgow coma scale score 13-15, unspecified time: Secondary | ICD-10-CM | POA: Diagnosis present

## 2018-10-15 DIAGNOSIS — Z8679 Personal history of other diseases of the circulatory system: Secondary | ICD-10-CM

## 2018-10-15 DIAGNOSIS — I10 Essential (primary) hypertension: Secondary | ICD-10-CM

## 2018-10-15 DIAGNOSIS — M25562 Pain in left knee: Secondary | ICD-10-CM | POA: Diagnosis present

## 2018-10-15 DIAGNOSIS — Z91018 Allergy to other foods: Secondary | ICD-10-CM

## 2018-10-15 DIAGNOSIS — Z20828 Contact with and (suspected) exposure to other viral communicable diseases: Secondary | ICD-10-CM | POA: Diagnosis present

## 2018-10-15 DIAGNOSIS — D62 Acute posthemorrhagic anemia: Secondary | ICD-10-CM

## 2018-10-15 DIAGNOSIS — S2243XA Multiple fractures of ribs, bilateral, initial encounter for closed fracture: Secondary | ICD-10-CM | POA: Diagnosis present

## 2018-10-15 DIAGNOSIS — S069X9A Unspecified intracranial injury with loss of consciousness of unspecified duration, initial encounter: Secondary | ICD-10-CM

## 2018-10-15 DIAGNOSIS — Z888 Allergy status to other drugs, medicaments and biological substances status: Secondary | ICD-10-CM

## 2018-10-15 DIAGNOSIS — R413 Other amnesia: Secondary | ICD-10-CM | POA: Diagnosis present

## 2018-10-15 HISTORY — DX: Unspecified intracranial injury with loss of consciousness of unspecified duration, initial encounter: S06.9X9A

## 2018-10-15 HISTORY — DX: Obesity, unspecified: E66.9

## 2018-10-15 HISTORY — DX: Unspecified intracranial injury with loss of consciousness status unknown, initial encounter: S06.9XAA

## 2018-10-15 LAB — PREPARE FRESH FROZEN PLASMA

## 2018-10-15 LAB — COMPREHENSIVE METABOLIC PANEL
ALT: 77 U/L — ABNORMAL HIGH (ref 0–44)
AST: 92 U/L — ABNORMAL HIGH (ref 15–41)
Albumin: 3.9 g/dL (ref 3.5–5.0)
Alkaline Phosphatase: 78 U/L (ref 38–126)
Anion gap: 15 (ref 5–15)
BUN: 12 mg/dL (ref 6–20)
CO2: 20 mmol/L — ABNORMAL LOW (ref 22–32)
Calcium: 8.8 mg/dL — ABNORMAL LOW (ref 8.9–10.3)
Chloride: 101 mmol/L (ref 98–111)
Creatinine, Ser: 1.23 mg/dL (ref 0.61–1.24)
GFR calc Af Amer: >60 mL/min (ref >60)
GFR calc non Af Amer: >60 mL/min (ref >60)
Glucose, Bld: 156 mg/dL — ABNORMAL HIGH (ref 70–99)
Potassium: 4.1 mmol/L (ref 3.5–5.1)
Sodium: 136 mmol/L (ref 135–145)
Total Bilirubin: 0.7 mg/dL (ref 0.3–1.2)
Total Protein: 7.4 g/dL (ref 6.5–8.1)

## 2018-10-15 LAB — BPAM FFP
Blood Product Expiration Date: 202005062359
Blood Product Expiration Date: 202005082359
ISSUE DATE / TIME: 202005031721
ISSUE DATE / TIME: 202005031721
Unit Type and Rh: 6200
Unit Type and Rh: 6200

## 2018-10-15 LAB — CBC
HCT: 42.6 % (ref 39.0–52.0)
Hemoglobin: 13.6 g/dL (ref 13.0–17.0)
MCH: 28 pg (ref 26.0–34.0)
MCHC: 31.9 g/dL (ref 30.0–36.0)
MCV: 87.7 fL (ref 80.0–100.0)
Platelets: 399 10*3/uL (ref 150–400)
RBC: 4.86 MIL/uL (ref 4.22–5.81)
RDW: 14.5 % (ref 11.5–15.5)
WBC: 16.7 10*3/uL — ABNORMAL HIGH (ref 4.0–10.5)
nRBC: 0 % (ref 0.0–0.2)

## 2018-10-15 LAB — ETHANOL: Alcohol, Ethyl (B): <10 mg/dL (ref <10)

## 2018-10-15 LAB — ABO/RH: ABO/RH(D): O NEG

## 2018-10-15 LAB — LACTIC ACID, PLASMA: Lactic Acid, Venous: 4.2 mmol/L (ref 0.5–1.9)

## 2018-10-15 LAB — CDS SEROLOGY

## 2018-10-15 LAB — PROTIME-INR
INR: 0.9 (ref 0.8–1.2)
Prothrombin Time: 12.5 seconds (ref 11.4–15.2)

## 2018-10-15 LAB — SARS CORONAVIRUS 2 BY RT PCR (HOSPITAL ORDER, PERFORMED IN ~~LOC~~ HOSPITAL LAB): SARS Coronavirus 2: NEGATIVE

## 2018-10-15 LAB — MRSA PCR SCREENING: MRSA by PCR: NEGATIVE

## 2018-10-15 LAB — TROPONIN I: Troponin I: <0.03 ng/mL (ref <0.03)

## 2018-10-15 MED ORDER — OXYCODONE HCL 5 MG PO TABS
5.0000 mg | ORAL_TABLET | Freq: Four times a day (QID) | ORAL | Status: DC | PRN
  Administered 2018-10-17 – 2018-10-19 (×7): 10 mg via ORAL
  Filled 2018-10-15 (×7): qty 2

## 2018-10-15 MED ORDER — IOHEXOL 300 MG/ML  SOLN
100.0000 mL | Freq: Once | INTRAMUSCULAR | Status: AC | PRN
  Administered 2018-10-15: 100 mL via INTRAVENOUS

## 2018-10-15 MED ORDER — ONDANSETRON 4 MG PO TBDP
4.0000 mg | ORAL_TABLET | Freq: Four times a day (QID) | ORAL | Status: DC | PRN
  Filled 2018-10-15: qty 1

## 2018-10-15 MED ORDER — SODIUM CHLORIDE 0.9 % IV BOLUS: 125.0000 mL | Freq: Once | INTRAVENOUS | Status: DC

## 2018-10-15 MED ORDER — ACETAMINOPHEN 325 MG PO TABS
650.0000 mg | ORAL_TABLET | Freq: Four times a day (QID) | ORAL | Status: DC
  Administered 2018-10-16 – 2018-10-19 (×14): 650 mg via ORAL
  Filled 2018-10-15 (×14): qty 2

## 2018-10-15 MED ORDER — CEFAZOLIN SODIUM-DEXTROSE 2-4 GM/100ML-% IV SOLN
2.0000 g | Freq: Once | INTRAVENOUS | Status: AC
  Administered 2018-10-15: 2 g via INTRAVENOUS
  Filled 2018-10-15: qty 100

## 2018-10-15 MED ORDER — TETANUS-DIPHTH-ACELL PERTUSSIS 5-2.5-18.5 LF-MCG/0.5 IM SUSP
0.5000 mL | Freq: Once | INTRAMUSCULAR | Status: AC
  Administered 2018-10-15: 0.5 mL via INTRAMUSCULAR
  Filled 2018-10-15: qty 0.5

## 2018-10-15 MED ORDER — FENTANYL CITRATE (PF) 100 MCG/2ML IJ SOLN
100.0000 ug | INTRAMUSCULAR | Status: DC | PRN
  Administered 2018-10-15: 100 ug via INTRAVENOUS
  Filled 2018-10-15: qty 2

## 2018-10-15 MED ORDER — ONDANSETRON HCL 4 MG/2ML IJ SOLN: 4.0000 mg | Freq: Four times a day (QID) | INTRAMUSCULAR | Status: DC | PRN

## 2018-10-15 MED ORDER — ONDANSETRON HCL 4 MG/2ML IJ SOLN
4.0000 mg | Freq: Four times a day (QID) | INTRAMUSCULAR | Status: DC | PRN
  Administered 2018-10-19: 4 mg via INTRAVENOUS
  Filled 2018-10-15: qty 2

## 2018-10-15 MED ORDER — HYDROMORPHONE HCL 1 MG/ML IJ SOLN
INTRAMUSCULAR | Status: AC
  Filled 2018-10-15: qty 1

## 2018-10-15 MED ORDER — METOPROLOL TARTRATE 5 MG/5ML IV SOLN: 5.0000 mg | Freq: Four times a day (QID) | INTRAVENOUS | Status: DC | PRN

## 2018-10-15 MED ORDER — DOCUSATE SODIUM 100 MG PO CAPS
200.0000 mg | ORAL_CAPSULE | Freq: Two times a day (BID) | ORAL | Status: DC
  Administered 2018-10-16 – 2018-10-19 (×7): 200 mg via ORAL
  Filled 2018-10-15 (×7): qty 2

## 2018-10-15 MED ORDER — CEFAZOLIN SODIUM-DEXTROSE 1-4 GM/50ML-% IV SOLN: 1.0000 g | Freq: Once | INTRAVENOUS | Status: DC

## 2018-10-15 MED ORDER — LACTATED RINGERS IV SOLN
INTRAVENOUS | Status: DC
  Administered 2018-10-15 – 2018-10-16 (×2): via INTRAVENOUS

## 2018-10-15 MED ORDER — HYDROMORPHONE HCL 1 MG/ML IJ SOLN
0.5000 mg | INTRAMUSCULAR | Status: DC | PRN
  Administered 2018-10-15 – 2018-10-16 (×2): 0.5 mg via INTRAVENOUS
  Filled 2018-10-15 (×2): qty 1

## 2018-10-15 MED ORDER — GABAPENTIN 300 MG PO CAPS
300.0000 mg | ORAL_CAPSULE | Freq: Three times a day (TID) | ORAL | Status: DC
  Administered 2018-10-16 – 2018-10-19 (×11): 300 mg via ORAL
  Filled 2018-10-15 (×11): qty 1

## 2018-10-15 NOTE — Progress Notes (Signed)
OS removed from left lower leg. Vaseline dressing with gauze placed and pressure applied.

## 2018-10-15 NOTE — ED Notes (Signed)
All staff in full PPE to include gowns, shoe covers, gloves, N95's, and head dress.

## 2018-10-15 NOTE — ED Notes (Addendum)
Pt's wife would like to be updated when he gets a bed. Her name is Revonda Standard and her number is [phone].
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
## 2018-10-15 NOTE — ED Notes (Signed)
Pt wife here.

## 2018-10-15 NOTE — ED Notes (Signed)
Report given to [place], RN 4N
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
## 2018-10-15 NOTE — ED Notes (Signed)
Dr. Marin Olp spoke with Neurology on his personal cell phone. Unsure of what the consulting physician's name is.

## 2018-10-15 NOTE — ED Notes (Signed)
Pt speaking with wife on telephone, pt continues to have repetitive questioning.
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
## 2018-10-16 ENCOUNTER — Inpatient Hospital Stay (HOSPITAL_COMMUNITY): Payer: Self-pay

## 2018-10-16 LAB — URINALYSIS, ROUTINE W REFLEX MICROSCOPIC
Bilirubin Urine: NEGATIVE
Glucose, UA: NEGATIVE mg/dL
Ketones, ur: NEGATIVE mg/dL
Leukocytes,Ua: NEGATIVE
Nitrite: NEGATIVE
Protein, ur: NEGATIVE mg/dL
Specific Gravity, Urine: >1.046 — ABNORMAL HIGH (ref 1.005–1.030)
pH: 5 (ref 5.0–8.0)

## 2018-10-16 LAB — BASIC METABOLIC PANEL
Anion gap: 10 (ref 5–15)
BUN: 12 mg/dL (ref 6–20)
CO2: 22 mmol/L (ref 22–32)
Calcium: 8.7 mg/dL — ABNORMAL LOW (ref 8.9–10.3)
Chloride: 104 mmol/L (ref 98–111)
Creatinine, Ser: 1.13 mg/dL (ref 0.61–1.24)
GFR calc Af Amer: >60 mL/min (ref >60)
GFR calc non Af Amer: >60 mL/min (ref >60)
Glucose, Bld: 139 mg/dL — ABNORMAL HIGH (ref 70–99)
Potassium: 4.5 mmol/L (ref 3.5–5.1)
Sodium: 136 mmol/L (ref 135–145)

## 2018-10-16 LAB — CBC
HCT: 37.8 % — ABNORMAL LOW (ref 39.0–52.0)
Hemoglobin: 12.5 g/dL — ABNORMAL LOW (ref 13.0–17.0)
MCH: 27.8 pg (ref 26.0–34.0)
MCHC: 33.1 g/dL (ref 30.0–36.0)
MCV: 84.2 fL (ref 80.0–100.0)
Platelets: 284 10*3/uL (ref 150–400)
RBC: 4.49 MIL/uL (ref 4.22–5.81)
RDW: 14.6 % (ref 11.5–15.5)
WBC: 13 10*3/uL — ABNORMAL HIGH (ref 4.0–10.5)
nRBC: 0 % (ref 0.0–0.2)

## 2018-10-16 LAB — HIV ANTIBODY (ROUTINE TESTING W REFLEX): HIV Screen 4th Generation wRfx: NONREACTIVE

## 2018-10-16 LAB — TYPE AND SCREEN
ABO/RH(D): O NEG
Antibody Screen: NEGATIVE
Unit division: 0
Unit division: 0

## 2018-10-16 LAB — BPAM RBC
Blood Product Expiration Date: 202005172359
Blood Product Expiration Date: 202005172359
ISSUE DATE / TIME: 202005031721
ISSUE DATE / TIME: 202005031721
Unit Type and Rh: 5100
Unit Type and Rh: 5100

## 2018-10-16 NOTE — Progress Notes (Signed)
Rehab Admissions Coordinator Note:  Patient was screened by Clois Dupes for appropriateness for an Inpatient Acute Rehab Consult per PT recs..  At this time, we are recommending Inpatient Rehab consult once pt able to tolerate more therapy.   Clois Dupes MSN 10/16/2018, 10:55 AM  I can be reached at [phone].
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
## 2018-10-16 NOTE — Progress Notes (Signed)
CSW completed SBIRT with the patient. No concern for any substance use or drinking. No further intervention required at this time.   Drucilla Schmidt, MSW, LCSW-A Clinical Social Worker [employer]
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
## 2018-10-17 ENCOUNTER — Inpatient Hospital Stay (HOSPITAL_COMMUNITY): Payer: Self-pay

## 2018-10-17 DIAGNOSIS — D72829 Elevated white blood cell count, unspecified: Secondary | ICD-10-CM

## 2018-10-17 DIAGNOSIS — I10 Essential (primary) hypertension: Secondary | ICD-10-CM

## 2018-10-17 DIAGNOSIS — Z8679 Personal history of other diseases of the circulatory system: Secondary | ICD-10-CM

## 2018-10-17 DIAGNOSIS — D62 Acute posthemorrhagic anemia: Secondary | ICD-10-CM

## 2018-10-17 DIAGNOSIS — I609 Nontraumatic subarachnoid hemorrhage, unspecified: Secondary | ICD-10-CM

## 2018-10-17 DIAGNOSIS — S270XXA Traumatic pneumothorax, initial encounter: Secondary | ICD-10-CM

## 2018-10-17 LAB — BLOOD PRODUCT ORDER (VERBAL) VERIFICATION

## 2018-10-17 MED ORDER — POLYETHYLENE GLYCOL 3350 17 G PO PACK
17.0000 g | PACK | Freq: Every day | ORAL | Status: DC
  Administered 2018-10-18 – 2018-10-19 (×2): 17 g via ORAL
  Filled 2018-10-17 (×3): qty 1

## 2018-10-17 MED ORDER — BACITRACIN ZINC 500 UNIT/GM EX OINT
TOPICAL_OINTMENT | Freq: Two times a day (BID) | CUTANEOUS | Status: DC
  Administered 2018-10-17 – 2018-10-18 (×4): via TOPICAL
  Administered 2018-10-19: 1 via TOPICAL
  Filled 2018-10-17: qty 28.35

## 2018-10-17 NOTE — Consult Note (Signed)
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 Anticipated functional outcomes upon discharge from inpatient rehab are modified independent and supervision  with PT, modified independent and supervision with OT, independent and modified independent  with SLP. 8. Estimated rehab length of stay to reach the above functional goals is: 8-12 days. 9. Anticipated D/C setting: Home 10. Anticipated post D/C treatments: HH therapy and Home excercise program 11. Overall Rehab/Functional Prognosis: excellent  RECOMMENDATIONS: This patient's condition is appropriate for continued rehabilitative care in the following setting: CIR when able to tolerate 3 hours of therapy per day Patient has agreed to participate in recommended program. Yes, if finances allow Note that insurance prior authorization may be required for reimbursement for recommended care.  Comment: Rehab Admissions Coordinator to follow up.   I have personally performed a face to face diagnostic evaluation, including, but not limited to relevant history and physical exam findings, of this patient and developed relevant assessment and plan.  Additionally, I have reviewed and concur with the physician assistant's documentation above.    Maryla Morrow, MD, ABPMR Mcarthur Rossetti Angiulli, PA-C 10/17/2018

## 2018-10-17 NOTE — Progress Notes (Signed)
Inpatient Rehab Admissions:  Inpatient Rehab Consult received.  I met with patient at the bedside for rehabilitation assessment and to discuss goals and expectations of an inpatient rehab admission.  He is agreeable to looking at [facility] stay pending cost. Will need to contact financial counselor and discuss possibilities for medicaid, etc.  Will follow up tomorrow.   Signed: Shann Medal, PT, DPT Admissions Coordinator [phone] 10/17/18  12:38 PM
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
## 2018-10-17 NOTE — Progress Notes (Signed)
Attempted to place pt on CPAP. He requested to be put on after 23:30 after his pain medication has been given.
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
## 2018-10-18 ENCOUNTER — Other Ambulatory Visit: Payer: Self-pay

## 2018-10-18 ENCOUNTER — Encounter (HOSPITAL_COMMUNITY): Payer: Self-pay

## 2018-10-18 MED ORDER — CITALOPRAM HYDROBROMIDE 40 MG PO TABS
40.0000 mg | ORAL_TABLET | Freq: Every day | ORAL | Status: DC
  Administered 2018-10-18: 40 mg via ORAL
  Filled 2018-10-18 (×2): qty 1

## 2018-10-18 MED ORDER — BUTALBITAL-APAP-CAFFEINE 50-325-40 MG PO TABS
1.0000 | ORAL_TABLET | Freq: Four times a day (QID) | ORAL | Status: DC | PRN
  Administered 2018-10-18 – 2018-10-19 (×2): 1 via ORAL
  Filled 2018-10-18 (×2): qty 1

## 2018-10-18 MED ORDER — HYDROMORPHONE HCL 1 MG/ML IJ SOLN
1.0000 mg | INTRAMUSCULAR | Status: DC | PRN
  Administered 2018-10-18 (×2): 1 mg via INTRAVENOUS
  Filled 2018-10-18 (×3): qty 1

## 2018-10-18 NOTE — Progress Notes (Signed)
Called Wife, [name], but phone to went to voicemail.  Bobby [name] 10:11 AM 10/18/2018

## 2018-10-18 NOTE — Progress Notes (Signed)
PT Cancellation Note  Patient Details Name: Bobby [name] MRN: [id number] DOB: 01/14/1976   Cancelled Treatment:    Reason Eval/Treat Not Completed: (P) Fatigue/lethargy limiting ability to participate(Pt reports feeling sleepy, seated in recliner with bipap applied reports he feels sleepy and to return later this pm.  )   Arantxa Piercey [name] 10/18/2018, 1:01 PM  Joycelyn Rua, PTA Acute Rehabilitation Services Pager [phone] Office [phone]
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
## 2018-10-18 NOTE — Progress Notes (Signed)
Inpatient Rehab Admissions Coordinator:   Spoke with wife, Revonda Standard, over the phone.  She is waiting to hear back from financial counseling regarding medicaid application and will f/u with me tomorrow.   Estill Dooms, PT, DPT Admissions Coordinator [phone] 10/18/18  2:47 PM

## 2018-10-18 NOTE — Progress Notes (Signed)
RT NOTE: Patient requested for RT to place him on CPAP for nap. RT placed patient on previous settings from last night. Patient tolerating well. RT will continue to monitor as needed.

## 2018-10-18 NOTE — Plan of Care (Addendum)
  Problem: Elimination: Goal: Will not experience complications related to bowel motility Outcome: Progressing   Problem: Coping: Goal: Level of anxiety will decrease Outcome: Progressing   Problem: Pain Managment: Goal: General experience of comfort will improve Outcome: Progressing    Pt would like to resume his Celexa 40 mg PO at HS. Day shift nurse updated.
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
## 2018-10-19 ENCOUNTER — Inpatient Hospital Stay (HOSPITAL_COMMUNITY)
Admission: RE | Admit: 2018-10-19 | Discharge: 2018-10-27 | DRG: 945 | Disposition: A | Discharge status: Home Health | Payer: Self-pay | Source: Intra-hospital | Attending: Physical Medicine & Rehabilitation | Admitting: Physical Medicine & Rehabilitation

## 2018-10-19 ENCOUNTER — Other Ambulatory Visit: Payer: Self-pay

## 2018-10-19 ENCOUNTER — Encounter: Payer: Self-pay | Admitting: Family Medicine

## 2018-10-19 ENCOUNTER — Inpatient Hospital Stay (HOSPITAL_COMMUNITY): Payer: Self-pay

## 2018-10-19 ENCOUNTER — Encounter (HOSPITAL_COMMUNITY): Payer: Self-pay | Admitting: *Deleted

## 2018-10-19 DIAGNOSIS — S069X9A Unspecified intracranial injury with loss of consciousness of unspecified duration, initial encounter: Secondary | ICD-10-CM | POA: Diagnosis present

## 2018-10-19 DIAGNOSIS — Z6841 Body Mass Index (BMI) 40.0 and over, adult: Secondary | ICD-10-CM

## 2018-10-19 DIAGNOSIS — Z888 Allergy status to other drugs, medicaments and biological substances status: Secondary | ICD-10-CM

## 2018-10-19 DIAGNOSIS — F418 Other specified anxiety disorders: Secondary | ICD-10-CM

## 2018-10-19 DIAGNOSIS — I10 Essential (primary) hypertension: Secondary | ICD-10-CM | POA: Diagnosis present

## 2018-10-19 DIAGNOSIS — K5900 Constipation, unspecified: Secondary | ICD-10-CM | POA: Diagnosis present

## 2018-10-19 DIAGNOSIS — S270XXD Traumatic pneumothorax, subsequent encounter: Secondary | ICD-10-CM

## 2018-10-19 DIAGNOSIS — R52 Pain, unspecified: Secondary | ICD-10-CM

## 2018-10-19 DIAGNOSIS — Z8349 Family history of other endocrine, nutritional and metabolic diseases: Secondary | ICD-10-CM

## 2018-10-19 DIAGNOSIS — S2242XD Multiple fractures of ribs, left side, subsequent encounter for fracture with routine healing: Secondary | ICD-10-CM

## 2018-10-19 DIAGNOSIS — S069X2A Unspecified intracranial injury with loss of consciousness of 31 minutes to 59 minutes, initial encounter: Secondary | ICD-10-CM

## 2018-10-19 DIAGNOSIS — Z79899 Other long term (current) drug therapy: Secondary | ICD-10-CM

## 2018-10-19 DIAGNOSIS — Z8249 Family history of ischemic heart disease and other diseases of the circulatory system: Secondary | ICD-10-CM

## 2018-10-19 DIAGNOSIS — Z91048 Other nonmedicinal substance allergy status: Secondary | ICD-10-CM

## 2018-10-19 DIAGNOSIS — S066X9D Traumatic subarachnoid hemorrhage with loss of consciousness of unspecified duration, subsequent encounter: Principal | ICD-10-CM

## 2018-10-19 DIAGNOSIS — S2241XA Multiple fractures of ribs, right side, initial encounter for closed fracture: Secondary | ICD-10-CM

## 2018-10-19 DIAGNOSIS — G4733 Obstructive sleep apnea (adult) (pediatric): Secondary | ICD-10-CM | POA: Diagnosis present

## 2018-10-19 DIAGNOSIS — S2242XA Multiple fractures of ribs, left side, initial encounter for closed fracture: Secondary | ICD-10-CM

## 2018-10-19 DIAGNOSIS — S069XAA Unspecified intracranial injury with loss of consciousness status unknown, initial encounter: Secondary | ICD-10-CM | POA: Diagnosis present

## 2018-10-19 DIAGNOSIS — Z8261 Family history of arthritis: Secondary | ICD-10-CM

## 2018-10-19 DIAGNOSIS — Z806 Family history of leukemia: Secondary | ICD-10-CM

## 2018-10-19 MED ORDER — CITALOPRAM HYDROBROMIDE 20 MG PO TABS
40.0000 mg | ORAL_TABLET | Freq: Every day | ORAL | Status: DC
  Administered 2018-10-19 – 2018-10-26 (×8): 40 mg via ORAL
  Filled 2018-10-19 (×8): qty 2

## 2018-10-19 MED ORDER — BACITRACIN ZINC 500 UNIT/GM EX OINT
TOPICAL_OINTMENT | Freq: Two times a day (BID) | CUTANEOUS | Status: DC
  Administered 2018-10-20: 09:00:00 via TOPICAL
  Administered 2018-10-21 – 2018-10-23 (×3): 1 via TOPICAL
  Administered 2018-10-23 – 2018-10-25 (×5): via TOPICAL
  Administered 2018-10-25: 1 via TOPICAL
  Administered 2018-10-26 – 2018-10-27 (×3): via TOPICAL
  Filled 2018-10-19 (×3): qty 28.35

## 2018-10-19 MED ORDER — ONDANSETRON 4 MG PO TBDP
4.0000 mg | ORAL_TABLET | Freq: Four times a day (QID) | ORAL | Status: DC | PRN
  Filled 2018-10-19: qty 1

## 2018-10-19 MED ORDER — SORBITOL 70 % SOLN
30.0000 mL | Freq: Every day | Status: DC | PRN
  Administered 2018-10-20 – 2018-10-21 (×2): 30 mL via ORAL
  Filled 2018-10-19 (×2): qty 30

## 2018-10-19 MED ORDER — GABAPENTIN 300 MG PO CAPS
300.0000 mg | ORAL_CAPSULE | Freq: Three times a day (TID) | ORAL | Status: DC
  Administered 2018-10-19 – 2018-10-20 (×2): 300 mg via ORAL
  Filled 2018-10-19 (×2): qty 1

## 2018-10-19 MED ORDER — DOCUSATE SODIUM 100 MG PO CAPS
200.0000 mg | ORAL_CAPSULE | Freq: Two times a day (BID) | ORAL | Status: DC
  Administered 2018-10-19 – 2018-10-27 (×16): 200 mg via ORAL
  Filled 2018-10-19 (×16): qty 2

## 2018-10-19 MED ORDER — BUTALBITAL-APAP-CAFFEINE 50-325-40 MG PO TABS
1.0000 | ORAL_TABLET | Freq: Four times a day (QID) | ORAL | Status: DC | PRN
  Administered 2018-10-19 – 2018-10-22 (×5): 1 via ORAL
  Filled 2018-10-19 (×6): qty 1

## 2018-10-19 MED ORDER — POLYETHYLENE GLYCOL 3350 17 G PO PACK
17.0000 g | PACK | Freq: Every day | ORAL | Status: DC
  Administered 2018-10-20 – 2018-10-26 (×4): 17 g via ORAL
  Filled 2018-10-19 (×9): qty 1

## 2018-10-19 MED ORDER — OXYCODONE HCL 5 MG PO TABS
5.0000 mg | ORAL_TABLET | Freq: Four times a day (QID) | ORAL | Status: DC | PRN
  Administered 2018-10-19 – 2018-10-23 (×5): 10 mg via ORAL
  Filled 2018-10-19 (×6): qty 2

## 2018-10-19 MED ORDER — ONDANSETRON HCL 4 MG/2ML IJ SOLN: 4.0000 mg | Freq: Four times a day (QID) | INTRAMUSCULAR | Status: DC | PRN

## 2018-10-19 MED ORDER — HYDRALAZINE HCL 20 MG/ML IJ SOLN: 10.0000 mg | Freq: Four times a day (QID) | INTRAMUSCULAR | Status: DC | PRN

## 2018-10-19 MED ORDER — ACETAMINOPHEN 325 MG PO TABS
650.0000 mg | ORAL_TABLET | Freq: Four times a day (QID) | ORAL | Status: DC
  Administered 2018-10-19 – 2018-10-27 (×30): 650 mg via ORAL
  Filled 2018-10-19 (×30): qty 2
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
## 2018-10-19 NOTE — Progress Notes (Signed)
Pt alert and oriented. Pt arrived to unit with cpap at bedside from home. Pt complaining of severe headache.
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
## 2018-10-19 NOTE — Progress Notes (Signed)
Inpatient Rehab Admissions Coordinator:   I have approval from [place], Trauma PA, and pt's wife is in agreement for transfer to inpatient rehab today.  I will alert pt/family, and RN/CM.    Estill Dooms, PT, DPT Admissions Coordinator [phone] 10/19/18  2:18 PM
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
## 2018-10-19 NOTE — Progress Notes (Signed)
Patient requested that his settings on his CPAP be adjusted due to "not getting enough air, it takes my breath". CPAP already on auto-titrate, however, minimum pressure increased to 14 from 10 per patient request. Mask also switched back to Medium for a better fit per patient. Patient states this is better. Resting comfortably. Informed patient to let us know if we need to make anymore adjustments.
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
## 2018-10-20 ENCOUNTER — Inpatient Hospital Stay (HOSPITAL_COMMUNITY): Payer: Self-pay | Admitting: Speech Pathology

## 2018-10-20 ENCOUNTER — Inpatient Hospital Stay (HOSPITAL_COMMUNITY): Payer: Self-pay | Admitting: Occupational Therapy

## 2018-10-20 ENCOUNTER — Inpatient Hospital Stay (HOSPITAL_COMMUNITY): Payer: Self-pay | Admitting: Physical Therapy

## 2018-10-20 ENCOUNTER — Ambulatory Visit (HOSPITAL_COMMUNITY): Payer: PRIVATE HEALTH INSURANCE

## 2018-10-20 LAB — COMPREHENSIVE METABOLIC PANEL
ALT: 71 U/L — ABNORMAL HIGH (ref 0–44)
AST: 47 U/L — ABNORMAL HIGH (ref 15–41)
Albumin: 3.4 g/dL — ABNORMAL LOW (ref 3.5–5.0)
Alkaline Phosphatase: 80 U/L (ref 38–126)
Anion gap: 14 (ref 5–15)
BUN: 10 mg/dL (ref 6–20)
CO2: 24 mmol/L (ref 22–32)
Calcium: 8.8 mg/dL — ABNORMAL LOW (ref 8.9–10.3)
Chloride: 96 mmol/L — ABNORMAL LOW (ref 98–111)
Creatinine, Ser: 0.96 mg/dL (ref 0.61–1.24)
GFR calc Af Amer: >60 mL/min (ref >60)
GFR calc non Af Amer: >60 mL/min (ref >60)
Glucose, Bld: 120 mg/dL — ABNORMAL HIGH (ref 70–99)
Potassium: 4.2 mmol/L (ref 3.5–5.1)
Sodium: 134 mmol/L — ABNORMAL LOW (ref 135–145)
Total Bilirubin: 1 mg/dL (ref 0.3–1.2)
Total Protein: 7.3 g/dL (ref 6.5–8.1)

## 2018-10-20 LAB — CBC WITH DIFFERENTIAL/PLATELET
Abs Immature Granulocytes: 0.14 10*3/uL — ABNORMAL HIGH (ref 0.00–0.07)
Basophils Absolute: 0 10*3/uL (ref 0.0–0.1)
Basophils Relative: 0 %
Eosinophils Absolute: 0.2 10*3/uL (ref 0.0–0.5)
Eosinophils Relative: 2 %
HCT: 38.3 % — ABNORMAL LOW (ref 39.0–52.0)
Hemoglobin: 13.1 g/dL (ref 13.0–17.0)
Immature Granulocytes: 1 %
Lymphocytes Relative: 14 %
Lymphs Abs: 1.7 10*3/uL (ref 0.7–4.0)
MCH: 28.2 pg (ref 26.0–34.0)
MCHC: 34.2 g/dL (ref 30.0–36.0)
MCV: 82.5 fL (ref 80.0–100.0)
Monocytes Absolute: 0.8 10*3/uL (ref 0.1–1.0)
Monocytes Relative: 6 %
Neutro Abs: 9.5 10*3/uL — ABNORMAL HIGH (ref 1.7–7.7)
Neutrophils Relative %: 77 %
Platelets: 401 10*3/uL — ABNORMAL HIGH (ref 150–400)
RBC: 4.64 MIL/uL (ref 4.22–5.81)
RDW: 14.2 % (ref 11.5–15.5)
WBC: 12.3 10*3/uL — ABNORMAL HIGH (ref 4.0–10.5)
nRBC: 0 % (ref 0.0–0.2)

## 2018-10-20 MED ORDER — GABAPENTIN 100 MG PO CAPS
200.0000 mg | ORAL_CAPSULE | Freq: Three times a day (TID) | ORAL | Status: DC
  Administered 2018-10-20 – 2018-10-21 (×3): 200 mg via ORAL
  Filled 2018-10-20 (×3): qty 2

## 2018-10-20 MED ORDER — CLONIDINE HCL 0.1 MG PO TABS
0.1000 mg | ORAL_TABLET | ORAL | Status: DC | PRN
  Administered 2018-10-20 – 2018-10-22 (×4): 0.1 mg via ORAL
  Filled 2018-10-20 (×4): qty 1

## 2018-10-20 MED ORDER — SALINE SPRAY 0.65 % NA SOLN
1.0000 | NASAL | Status: DC | PRN
  Filled 2018-10-20: qty 44

## 2018-10-20 MED ORDER — TRAMADOL HCL 50 MG PO TABS
50.0000 mg | ORAL_TABLET | Freq: Four times a day (QID) | ORAL | Status: DC | PRN
  Administered 2018-10-20 – 2018-10-26 (×6): 50 mg via ORAL
  Filled 2018-10-20 (×8): qty 1

## 2018-10-20 MED ORDER — ACETAMINOPHEN 325 MG PO TABS
650.0000 mg | ORAL_TABLET | Freq: Once | ORAL | Status: AC
  Administered 2018-10-20: 03:00:00 650 mg via ORAL
  Filled 2018-10-20: qty 2

## 2018-10-20 MED ORDER — TOPIRAMATE 25 MG PO TABS
25.0000 mg | ORAL_TABLET | Freq: Two times a day (BID) | ORAL | Status: DC
  Administered 2018-10-20 – 2018-10-22 (×5): 25 mg via ORAL
  Filled 2018-10-20 (×5): qty 1

## 2018-10-20 NOTE — Progress Notes (Signed)
This nurse contacted the on call provider,  Marissa Nestle, PA at [phone]. Pt c/o pain 10/10 headache, BP 180/102, HR 60. Medication Orders placed, and administered. This nurse will remain at beside to provide comfort for the pt.

## 2018-10-20 NOTE — Progress Notes (Signed)
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 hrs/day, 5 days/week), OT (1-2 hrs/day, 5 days/week) and SLP (1-2 hrs/day, 5 days/week) to address physical and functional deficits in the context of the above medical diagnosis(es)? Yes Addressing deficits in the following areas: balance, endurance, locomotion, strength, transferring, bathing, dressing, toileting, cognition and psychosocial support 5. Can the patient actively participate in an intensive therapy program of at least 3 hrs of therapy per day at least 5 days per week? Potentially 6. The potential for patient to make measurable gains while on inpatient rehab is excellent 7. Anticipated functional outcomes upon discharge from inpatient rehab are modified independent and supervision  with PT, modified independent and supervision with OT, independent and modified independent with SLP. 8. Estimated rehab length of stay to reach the above functional goals is: 8-12 days. 9. Anticipated D/C setting: Home 10. Anticipated post D/C treatments: HH therapy and Home excercise  program 11. Overall Rehab/Functional Prognosis: excellent  RECOMMENDATIONS: This patient's condition is appropriate for continued rehabilitative care in the following setting: CIR when able to tolerate 3 hours of therapy per day Patient has agreed to participate in recommended program. Yes, if finances allow Note that insurance prior authorization may be required for reimbursement for recommended care.  Comment: Rehab Admissions Coordinator to follow up.   I have personally performed a face to face diagnostic evaluation, including, but not limited to relevant history and physical exam findings, of this patient and developed relevant assessment and plan.  Additionally, I have reviewed and concur with the physician assistant's documentation above.   Maryla Morrow, MD, ABPMR Mcarthur Rossetti Angiulli, PA-C 10/17/2018        Revision History                        Routing History
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
## 2018-10-20 NOTE — Progress Notes (Signed)
Inpatient Rehabilitation  Patient information reviewed and entered into eRehab system by Suhan Paci M. Neida Ellegood, M.A., CCC/SLP, PPS Coordinator.  Information including medical coding, functional ability and quality indicators will be reviewed and updated through discharge.    

## 2018-10-20 NOTE — Progress Notes (Signed)
Pt complaining of 8/10 headache. Medication given to pt. Pt also complaining of multiple bright lights with in the room, this nurse attempted to cover the lights that were bothering the pt, to help the pt be more comfortable. Will continue to monitor.
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
## 2018-10-20 NOTE — Progress Notes (Signed)
RT offered pt assistance with his home CPAP. Pt stated he could place himself on without assistance. RT expressed to pt that if he needs anything to have RN call RT. RT will continue to monitor.

## 2018-10-21 ENCOUNTER — Inpatient Hospital Stay (HOSPITAL_COMMUNITY): Payer: Self-pay

## 2018-10-21 ENCOUNTER — Encounter (HOSPITAL_COMMUNITY): Payer: Self-pay

## 2018-10-21 ENCOUNTER — Inpatient Hospital Stay (HOSPITAL_COMMUNITY): Payer: Self-pay | Admitting: Physical Therapy

## 2018-10-21 DIAGNOSIS — G44311 Acute post-traumatic headache, intractable: Secondary | ICD-10-CM

## 2018-10-21 DIAGNOSIS — S069X2S Unspecified intracranial injury with loss of consciousness of 31 minutes to 59 minutes, sequela: Secondary | ICD-10-CM

## 2018-10-21 MED ORDER — GABAPENTIN 100 MG PO CAPS
100.0000 mg | ORAL_CAPSULE | Freq: Three times a day (TID) | ORAL | Status: DC
  Administered 2018-10-21 – 2018-10-22 (×3): 100 mg via ORAL
  Filled 2018-10-21 (×3): qty 1

## 2018-10-21 NOTE — Progress Notes (Signed)
Speech Language Pathology Daily Session Note  Patient Details  Name: Bobby [name] MRN: [id number] Date of Birth: Jan 04, 1976  Today's Date: 10/21/2018 SLP Individual Time: 1000-1030 SLP Individual Time Calculation (min): 30 min  Short Term Goals: Week 1: SLP Short Term Goal 1 (Week 1): STGs=LTGs due to short length of stay   Skilled Therapeutic Interventions:  Skilled treatment session focused on cognition goals. SLP facilitated session by providing intermittent supervision to complete complex novel card game. Pt able to comprehend, retain and implement rules of game with Mod I. Pt with improved selective attention as he demonstrated selective attention for ~ 30 minutes with no rest breaks - door left slightly open. Additionally, pt able to recall therapy schedule with complete independence. Pt left upright in recliner chair with all needs within reach. Continue per current plan of care.      Pain Pain Assessment Pain Scale: 0-10 Pain Score: 0-No pain  Therapy/Group: Individual Therapy  Dannon Nguyenthi 10/21/2018, 12:10 PM

## 2018-10-21 NOTE — Progress Notes (Signed)
Physical Therapy Session Note  Patient Details  Name: Bobby [name] MRN: [id number] Date of Birth: 01/10/1976  Today's Date: 10/21/2018 PT Individual Time: 1400-1415 PT Individual Time Calculation (min): 15 min  and Today's Date: 10/21/2018 PT Missed Time: 15 Minutes Missed Time Reason: Patient fatigue  Short Term Goals: Week 1:  PT Short Term Goal 1 (Week 1): STG = LTG due to short ELOS.  Skilled Therapeutic Interventions/Progress Updates:    Pt seated in recliner upon PT arrival, pt reports headache and throbbing but does not rate. Pt declines any standing activity this session, pt reports he is nervous to stand because it might increase his head throbbing and cause him to become nauseated. Therapist educated pt on OOB activity tolerance and recommended at least trying to stand and move around a little bit. Pt continues to decline, pt reports he is just too fatigued this afternoon. Therapist and pt dicussed earlier session, pt able to recall tasks he worked on this morning with OT. Pt missed 15 minutes of skilled therapy tx this session secondary to fatigue.   Therapy Documentation Precautions:  Precautions Precautions: Fall Precaution Comments: low stimulation environment, HA, light and sound sensitive Restrictions Weight Bearing Restrictions: No    Therapy/Group: Individual Therapy  Cresenciano Genre, PT, DPT 10/21/2018, 12:15 PM

## 2018-10-21 NOTE — Progress Notes (Signed)
Physical Therapy Session Note  Patient Details  Name: Bobby [name] MRN: [id number] Date of Birth: 11/30/1975  Today's Date: 10/21/2018 PT Individual Time: 9179-1505 PT Individual Time Calculation (min): 64 min   and T oday's Date: 10/21/2018 PT Missed Time: 9 Minutes Missed Time Reason: Patient fatigue;Other (Comment)(therapist behind schedule)  Short Term Goals: Week 1:  PT Short Term Goal 1 (Week 1): STG = LTG due to short ELOS.  Skilled Therapeutic Interventions/Progress Updates:   Pt received sitting in recliner and reports he has been having sudden onset of a pressure originating in his low back that spreads up to his head stating it feels like a pounding pulse in his head - reports it happens at rest (even at night while sleeping) and with activity. Pt requires significant amount of time to progress from reclined position to sitting upright in chair due to 2 episodes of the back to head pounding pain - cuing for relaxation with pt demonstrating good breathing technique. Pt uses fan on face to assist with head pounding pain relief during session. Performed LB clothing management with assist for threading feet but pt able to complete dressing in standing with supervision for balance. Pt performed sit<>stand from recliner to RW with close supervision for safety throughout session. Pt reporting muscle pain along R middle trap - therapist performed myofascial release with pt reporting decreased pain. Ambulated ~6ft (3 standing breaks during), ~40ft (1 standing break during) with prolonged seated break between between - both using RW and close supervision for safety. Pt educated on importance of gradual increase in activity tolerance, standing tolerance, and progression of ambulation distance and with fewer standing rest breaks during. Pt left sitting in recliner with needs in reach and B LEs elevated - pt understands to call for assistance to get out of chair.   Therapy Documentation Precautions:   Precautions Precautions: Fall Precaution Comments: low stimulation environment, HA, light and sound sensitive Restrictions Weight Bearing Restrictions: No  Therapy/Group: Individual Therapy  Ginny Forth, PT, DPT 10/21/2018, 3:41 PM
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
## 2018-10-21 NOTE — Progress Notes (Signed)
Occupational Therapy Session Note  Patient Details  Name: Bobby [name] MRN: [id number] Date of Birth: [birth year]  Today's Date: 10/21/2018 OT Individual Time: 0998-3382 OT Individual Time Calculation (min): 60 min    Short Term Goals: Week 1:  OT Short Term Goal 1 (Week 1): patient will complete bathing and dressing tasks with min A and good tolerance of activity with continued low stimulation environment OT Short Term Goal 2 (Week 1): patient will complete functional transfers with CG A  OT Short Term Goal 3 (Week 1): Patient will tolerate visual motor activity with focus on saccades/pursuits at slow rate  Skilled Therapeutic Interventions/Progress Updates:     1:1. Pt received seated on BSC attempting to void bowel. Pt requesting OT to step out while attempting to void therefore missing 15 min of skilled OT d/t toileting. Pt sit to stand from [facility] with CGA for posterior hygiene. Pt and OT discuss adaptive toilet aides for wiping and OT gives pt info sheet for family to potentially bring by a scrubbling bubles fresh brush to trial. Pt bathes seated with A to wash lower LE and buttocks. Pt would benefit from LHSS to wash feet. Pt dons shirt with shorts with CGA and socks with total A. Pt brushes teeth with set up. Exited session with p tseated in w/c, call light in reach and all needs met  Therapy Documentation Precautions:  Precautions Precautions: Fall Precaution Comments: low stimulation environment, HA, light and sound sensitive Restrictions Weight Bearing Restrictions: No General:   Vital Signs: Therapy Vitals Temp: 98.9 F (37.2 C) Temp Source: Oral Pulse Rate: 70 Resp: 19 BP: (!) 143/91 Oxygen Therapy SpO2: 98 % O2 Device: Room Air Pain: Pain Assessment Pain Scale: 0-10 Pain Score: 0-No pain Pain Type: Acute pain Pain Location: Head Pain Orientation: Anterior Pain Descriptors / Indicators: Pressure Pain Onset: On-going Pain Intervention(s): Medication (See  eMAR) ADL: ADL Eating: Independent Where Assessed-Eating: Chair Grooming: Setup Where Assessed-Grooming: Chair Upper Body Bathing: Minimal assistance Where Assessed-Upper Body Bathing: Chair Lower Body Bathing: Maximal assistance Where Assessed-Lower Body Bathing: Chair Upper Body Dressing: Moderate assistance Where Assessed-Upper Body Dressing: Chair Lower Body Dressing: Maximal assistance Where Assessed-Lower Body Dressing: Chair Toileting: Not assessed Toilet Transfer: Not assessed, Other (comment)(patient with poor tolerance for movement at this time - using urinal to void, bariatric commode proviced but not trialed due to HA) Tub/Shower Transfer: Not assessed Gaffer Transfer: Not assessed ADL Comments: patient required rest breaks with minimal activity required for sponge bath seated in recliner due to HA  Vision   Perception    Praxis   Exercises:   Other Treatments:     Therapy/Group: Individual Therapy  Tonny Branch 10/21/2018, 8:31 AM

## 2018-10-21 NOTE — Progress Notes (Signed)
Called [phone] and spoke with charge RN re: placing patient in bed and removing pants for lower extremity venous duplex to be completed. Upon arrival, patient is still in chair. Will attempt again later today or tomorrow as schedule permits.  10/21/2018 10:45 AM Gertie Fey, MHA, RVT, RDCS, RDMS

## 2018-10-22 ENCOUNTER — Inpatient Hospital Stay (HOSPITAL_COMMUNITY): Payer: Self-pay

## 2018-10-22 DIAGNOSIS — M7989 Other specified soft tissue disorders: Secondary | ICD-10-CM

## 2018-10-22 MED ORDER — TOPIRAMATE 25 MG PO TABS
50.0000 mg | ORAL_TABLET | Freq: Two times a day (BID) | ORAL | Status: DC
  Administered 2018-10-22 – 2018-10-27 (×10): 50 mg via ORAL
  Filled 2018-10-22 (×10): qty 2

## 2018-10-22 MED ORDER — TRAZODONE HCL 50 MG PO TABS
50.0000 mg | ORAL_TABLET | Freq: Every evening | ORAL | Status: DC | PRN
  Administered 2018-10-22: 21:00:00 50 mg via ORAL
  Filled 2018-10-22: qty 1

## 2018-10-22 NOTE — Progress Notes (Signed)
Bilateral lower extremity venous duplex has been completed. Preliminary results can be found in CV Proc through chart review.   10/22/18 2:03 PM Olen Cordial RVT
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
## 2018-10-23 ENCOUNTER — Inpatient Hospital Stay (HOSPITAL_COMMUNITY): Payer: PRIVATE HEALTH INSURANCE | Admitting: Physical Therapy

## 2018-10-23 ENCOUNTER — Inpatient Hospital Stay (HOSPITAL_COMMUNITY): Payer: Self-pay | Admitting: Speech Pathology

## 2018-10-23 ENCOUNTER — Inpatient Hospital Stay (HOSPITAL_COMMUNITY): Payer: Self-pay

## 2018-10-23 ENCOUNTER — Encounter (HOSPITAL_COMMUNITY): Payer: Self-pay | Admitting: Psychology

## 2018-10-23 DIAGNOSIS — M545 Low back pain: Secondary | ICD-10-CM

## 2018-10-23 MED ORDER — TRAZODONE HCL 50 MG PO TABS
100.0000 mg | ORAL_TABLET | Freq: Every day | ORAL | Status: DC
  Administered 2018-10-23 – 2018-10-26 (×4): 100 mg via ORAL
  Filled 2018-10-23 (×4): qty 2

## 2018-10-23 MED ORDER — METHOCARBAMOL 500 MG PO TABS
500.0000 mg | ORAL_TABLET | Freq: Four times a day (QID) | ORAL | Status: DC
  Administered 2018-10-23 – 2018-10-27 (×16): 500 mg via ORAL
  Filled 2018-10-23 (×16): qty 1
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
## 2018-10-23 NOTE — Progress Notes (Signed)
Speech Language Pathology Daily Session Note  Patient Details  Name: Bobby [name] MRN: [id number] Date of Birth: 1976/05/19  Today's Date: 10/23/2018 SLP Individual Time: 1355-1500 SLP Individual Time Calculation (min): 65 min  Short Term Goals: Week 1: SLP Short Term Goal 1 (Week 1): STGs=LTGs due to short length of stay   Skilled Therapeutic Interventions: Skilled treatment session focused on cognitive goals. SLP facilitated session by providing extra time for problem solving during a basic money management task. SLP also facilitated session by providing Min A verbal cues for patient to self-monitor and correct errors with basic mathematical word problems. Patient performed all tasks in a mildly distracting environment with supervision verbal cues for redirection. Patient left upright in recliner with all needs within reach. Continue with current plan of care.      Pain No/Denies Pain   Therapy/Group: Individual Therapy  [name] 10/23/2018, 3:02 PM
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
## 2018-10-24 ENCOUNTER — Inpatient Hospital Stay (HOSPITAL_COMMUNITY): Payer: Self-pay

## 2018-10-24 ENCOUNTER — Inpatient Hospital Stay (HOSPITAL_COMMUNITY): Payer: Self-pay | Admitting: Physical Therapy

## 2018-10-24 ENCOUNTER — Inpatient Hospital Stay (HOSPITAL_COMMUNITY): Payer: Self-pay | Admitting: Speech Pathology

## 2018-10-24 ENCOUNTER — Encounter (HOSPITAL_COMMUNITY): Payer: Self-pay | Admitting: Psychology

## 2018-10-24 DIAGNOSIS — R52 Pain, unspecified: Secondary | ICD-10-CM
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
## 2018-10-24 NOTE — Progress Notes (Signed)
Occupational Therapy Session Note  Patient Details  Name: KHAMERON GRUENWALD MRN: [id number] Date of Birth: May 12, 1976  Today's Date: 10/24/2018 OT Individual Time: 1300-1345 OT Individual Time Calculation (min): 45 min    Short Term Goals: Week 1:  OT Short Term Goal 1 (Week 1): patient will complete bathing and dressing tasks with min A and good tolerance of activity with continued low stimulation environment OT Short Term Goal 2 (Week 1): patient will complete functional transfers with CG A  OT Short Term Goal 3 (Week 1): Patient will tolerate visual motor activity with focus on saccades/pursuits at slow rate  Skilled Therapeutic Interventions/Progress Updates:    Pt received supine with c/o pain in back, no request for intervention. Pt completed bed mobility to EOB with mod I. Pt required several minutes of increased time to stretch and adjust to position change with cueing for breathing techniques provided. Pt completed sit > stand transfer with RW, mod I. Pt completed 120 ft of functional mobility with 1 instance of his L hamstring/back "spasming" and requiring standing and then seated rest break. Pt completed another 50 ft of functional mobility to ADL apartment where idea of TTB was presented. Edu provided re indications, use and fall risk reduction strategies. Pt returned to room, with no rest breaks, (S)- mod I. Pt left sitting up in recliner with all needs met.    Therapy Documentation Precautions:  Precautions Precautions: Fall Precaution Comments: low stimulation environment, HA, light and sound sensitive Restrictions Weight Bearing Restrictions: No   Therapy/Group: Individual Therapy  Curtis Sites 10/24/2018, 7:16 AM
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
## 2018-10-24 NOTE — Progress Notes (Signed)
Physical Therapy Discharge Summary  Patient Details  Name: Bobby [name] MRN: [id number] Date of Birth: 07-03-1975  Today's Date: 10/26/2018   Patient has met 9 of 9 long term goals due to improved activity tolerance, improved balance, improved postural control, increased strength, increased range of motion, decreased pain, ability to compensate for deficits, improved attention, improved awareness and improved coordination.  Patient to discharge at an ambulatory level supervision with RW.   Patient's care partner is independent to provide the necessary physical and cognitive assistance at discharge (pt's wife, Bobby [name], educated re pt's CLOF via telephone & voiced understanding of all information).  Reasons goals not met: n/a  Recommendation:  Patient will benefit from ongoing skilled PT services in home health setting to continue to advance safe functional mobility, address ongoing impairments in balance, endurance, strength, progress gait with LRAD, and minimize fall risk.  Equipment: bariatric RW  Reasons for discharge: treatment goals met and discharge from hospital  Patient/family agrees with progress made and goals achieved: Yes  PT Discharge Precautions/Restrictions Precautions Precautions: Fall Restrictions Weight Bearing Restrictions: No  Vision/Perception  Pt wears glasses at all times at baseline. Pt denies changes in baseline vision. Perception appears WNL.  Cognition Overall Cognitive Status: Within Functional Limits for tasks assessed Arousal/Alertness: Awake/alert Orientation Level: Oriented X4 Sustained Attention: Appears intact Memory: Appears intact Awareness: Appears intact (intellectual, emergent) [employer] Scales of Cognitive Functioning: Purposeful/appropriate   Sensation Sensation Light Touch: Appears Intact(pt denies numbness/tingling) Proprioception: Appears Intact Coordination Gross Motor Movements are Fluid and Coordinated: Yes Fine  Motor Movements are Fluid and Coordinated: Yes   Motor  Motor Motor - Discharge Observations: generalized deconditioning   Mobility Bed mobility: mod I with headboard as pt reports he will do at home. Sit<>stand: independent  Locomotion  Gait Ambulation: Yes Gait Assistance: Supervision/Verbal cueing Gait Distance (Feet): 175 Feet Assistive device: Rolling walker Gait Gait: Yes Gait Pattern: Impaired Gait Pattern: (decreased hip/knee flexion during LLE swing phase, decreased weight shifting, decreased step length BLE & Decreased BLE stride length, minimal heel strike BLE, wide BOS) Gait velocity: decreased Stairs / Additional Locomotion Stairs: Yes Stair Management Technique: One rail Right Number of Stairs: 4 Height of Stairs: 6(inches) Wheelchair Mobility Wheelchair Mobility: No   Trunk/Postural Assessment  Lumbar Assessment Lumbar Assessment: Exceptions to WFL(posterior pelvic tilt) Postural Control Righting Reactions: slightly delayed   Balance Balance Balance Assessed: Yes Standardized Balance Assessment Standardized Balance Assessment: Berg Balance Test on 10/24/2018 Berg Balance Test Sit to Stand: Able to stand without using hands and stabilize independently Standing Unsupported: Able to stand safely 2 minutes Sitting with Back Unsupported but Feet Supported on Floor or Stool: Able to sit safely and securely 2 minutes Stand to Sit: Sits safely with minimal use of hands Transfers: Able to transfer safely, minor use of hands Standing Unsupported with Eyes Closed: Able to stand 10 seconds safely Standing Ubsupported with Feet Together: Able to place feet together independently and stand 1 minute safely From Standing, Reach Forward with Outstretched Arm: Can reach confidently >25 cm (10") From Standing Position, Pick up Object from Floor: Unable to try/needs assist to keep balance(unable 2/2 low back pain) From Standing Position, Turn to Look Behind Over each  Shoulder: Looks behind from both sides and weight shifts well Turn 360 Degrees: Able to turn 360 degrees safely but slowly Standing Unsupported, Alternately Place Feet on Step/Stool: Able to stand independently and safely and complete 8 steps in 20 seconds Standing Unsupported, One Foot in Front: Able  to plae foot ahead of the other independently and hold 30 seconds Standing on One Leg: Able to lift leg independently and hold 5-10 seconds Total Score: 48  Extremity Assessment  All extremities WFL.    Bobby [name] 10/26/2018, 6:59 PM
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
## 2018-10-24 NOTE — Plan of Care (Signed)
  Problem: Consults Goal: RH GENERAL PATIENT EDUCATION Description See Patient Education module for education specifics. Outcome: Progressing   Problem: RH BOWEL ELIMINATION Goal: RH STG MANAGE BOWEL WITH ASSISTANCE Description STG Manage Bowel with Mod I Assistance.  Outcome: Progressing Goal: RH STG MANAGE BOWEL W/MEDICATION W/ASSISTANCE Description STG Manage Bowel with Medication with Min Assistance.  Outcome: Progressing   Problem: RH SKIN INTEGRITY Goal: RH STG SKIN FREE OF INFECTION/BREAKDOWN Description No new breakdown with min assist   Outcome: Progressing Goal: RH STG MAINTAIN SKIN INTEGRITY WITH ASSISTANCE Description STG Maintain Skin Integrity With min Assistance.  Outcome: Progressing   Problem: RH SAFETY Goal: RH STG ADHERE TO SAFETY PRECAUTIONS W/ASSISTANCE/DEVICE Description STG Adhere to Safety Precautions With Min Assistance/Device.  Outcome: Progressing   Problem: RH PAIN MANAGEMENT Goal: RH STG PAIN MANAGED AT OR BELOW PT'S PAIN GOAL Description <3 out of 10.   Outcome: Progressing   Problem: RH Vision Goal: RH LTG Vision (Specify) Outcome: Progressing   

## 2018-10-24 NOTE — Progress Notes (Signed)
Speech Language Pathology Daily Session Note  Patient Details  Name: Bobby [name] MRN: [id number] Date of Birth: [birth year]  Today's Date: 10/24/2018 SLP Individual Time: 0815-0910 SLP Individual Time Calculation (min): 55 min  Short Term Goals: Week 1: SLP Short Term Goal 1 (Week 1): STGs=LTGs due to short length of stay   Skilled Therapeutic Interventions: Skilled treatment session focused on cognitive goals. Upon arrival, patient was asleep in the recliner but was slow to awaken. Patient had multiple complaints and required more than a reasonable amount of time to initiate sitting up. Patient was able to alternate his attention between self-feeding and a functional conversation that focused on discharge planning. Patient required Min A verbal cues for anticipatory awareness in regards to his current deficits and their impact on his function at home. Patient left upright in recliner with all needs within reach. Continue with current plan of care.       Pain Pain Assessment Pain Scale: 0-10 Pain Score: 0-No pain  Therapy/Group: Individual Therapy  Mailee Klaas 10/24/2018, 3:03 PM

## 2018-10-24 NOTE — Progress Notes (Signed)
Home until CPAP. No assistance needed .

## 2018-10-25 ENCOUNTER — Inpatient Hospital Stay (HOSPITAL_COMMUNITY): Payer: Self-pay | Admitting: *Deleted

## 2018-10-25 ENCOUNTER — Inpatient Hospital Stay (HOSPITAL_COMMUNITY): Payer: Self-pay

## 2018-10-25 ENCOUNTER — Inpatient Hospital Stay (HOSPITAL_COMMUNITY): Payer: Self-pay | Admitting: Speech Pathology

## 2018-10-25 ENCOUNTER — Inpatient Hospital Stay (HOSPITAL_COMMUNITY): Payer: Self-pay | Admitting: Physical Therapy

## 2018-10-25 DIAGNOSIS — R52 Pain, unspecified: Secondary | ICD-10-CM

## 2018-10-25 NOTE — Plan of Care (Signed)
  Problem: Consults Goal: RH GENERAL PATIENT EDUCATION Description See Patient Education module for education specifics. Outcome: Progressing   Problem: RH BOWEL ELIMINATION Goal: RH STG MANAGE BOWEL WITH ASSISTANCE Description STG Manage Bowel with Mod I Assistance.  Outcome: Progressing Goal: RH STG MANAGE BOWEL W/MEDICATION W/ASSISTANCE Description STG Manage Bowel with Medication with Min Assistance.  Outcome: Progressing   Problem: RH SKIN INTEGRITY Goal: RH STG SKIN FREE OF INFECTION/BREAKDOWN Description No new breakdown with min assist   Outcome: Progressing Goal: RH STG MAINTAIN SKIN INTEGRITY WITH ASSISTANCE Description STG Maintain Skin Integrity With min Assistance.  Outcome: Progressing   Problem: RH SAFETY Goal: RH STG ADHERE TO SAFETY PRECAUTIONS W/ASSISTANCE/DEVICE Description STG Adhere to Safety Precautions With Min Assistance/Device.  Outcome: Progressing   Problem: RH PAIN MANAGEMENT Goal: RH STG PAIN MANAGED AT OR BELOW PT'S PAIN GOAL Description <3 out of 10.   Outcome: Progressing   Problem: RH Vision Goal: RH LTG Vision (Specify) Outcome: Progressing

## 2018-10-25 NOTE — Progress Notes (Signed)
Physical Therapy Session Note  Patient Details  Name: Bobby [name] MRN: [id number] Date of Birth: 12/04/1975  Today's Date: 10/25/2018 PT Individual Time: 9977-4142 PT Individual Time Calculation (min): 45 min   Short Term Goals: Week 1:  PT Short Term Goal 1 (Week 1): STG = LTG due to short ELOS.     Skilled Therapeutic Interventions/Progress Updates:  Pt resting in recliner, fully reclined iwht K pad on low back, which he stated was helpful.  No pain at rest in this position.  PT coached pt on diaphragmatic breathing and relaxation imagery.  Pt sat up slowly with pausing in positions towards upright.  Pt donned shorts in sitting > standing extra time.  Gait training iwht RW over level tile 150' x 2  with RW, distant supervision. Up/down 4 steps with R rail, supervision and cues for correct sequencing.  10 MWT = 14 seconds.  PT instructed pt in self stretching R/L hamstrings, x 30 seconds x 2 .     Therapy Documentation Precautions:  Precautions Precautions: Fall Precaution Comments: low stimulation environment, HA, light and sound sensitive Restrictions Weight Bearing Restrictions: No        Therapy/Group: Individual Therapy  Bazil Dhanani 10/25/2018, 10:32 AM

## 2018-10-25 NOTE — Progress Notes (Signed)
Occupational Therapy Session Note  Patient Details  Name: Bobby [name] MRN: [id number] Date of Birth: Mar 19, 1976  Today's Date: 10/25/2018 OT Individual Time: 4098-1191 OT Individual Time Calculation (min): 57 min    Short Term Goals: Week 1:  OT Short Term Goal 1 (Week 1): patient will complete bathing and dressing tasks with min A and good tolerance of activity with continued low stimulation environment OT Short Term Goal 2 (Week 1): patient will complete functional transfers with CG A  OT Short Term Goal 3 (Week 1): Patient will tolerate visual motor activity with focus on saccades/pursuits at slow rate  Skilled Therapeutic Interventions/Progress Updates:    1:1. Pt received in recliner excited about progress made over last few days. PT reporting tightness in back but no pain thanks to [place]. Pt and OT discuss energy conservation, rest breaks and TBI healing. Pt reporting need to toilet. Pt completes stand pivot transfer with set up using RW to toilet. Pt able to have BM on toilet. Pt requires A for posterior hygiene in standing for thoroughness. RN clears shower and pt ambulates into bathroom with OT placing bariatric BSC in shower. Pt showers seated and standing with supervision using LHSS to wash B feet. Pt dresses with set up from recliner. Call light in reach, exit alarm on and all needs met as OT leaves room   Therapy Documentation Precautions:  Precautions Precautions: Fall Precaution Comments: low stimulation environment, HA, light and sound sensitive Restrictions Weight Bearing Restrictions: No General:   Vital Signs:  Pain:   ADL: ADL Eating: Independent Where Assessed-Eating: Chair Grooming: Setup Where Assessed-Grooming: Chair Upper Body Bathing: Minimal assistance Where Assessed-Upper Body Bathing: Chair Lower Body Bathing: Maximal assistance Where Assessed-Lower Body Bathing: Chair Upper Body Dressing: Moderate assistance Where Assessed-Upper Body Dressing:  Chair Lower Body Dressing: Maximal assistance Where Assessed-Lower Body Dressing: Chair Toileting: Not assessed Toilet Transfer: Not assessed, Other (comment)(patient with poor tolerance for movement at this time - using urinal to void, bariatric commode proviced but not trialed due to HA) Tub/Shower Transfer: Not assessed Gaffer Transfer: Not assessed ADL Comments: patient required rest breaks with minimal activity required for sponge bath seated in recliner due to HA  Vision   Perception    Praxis   Exercises:   Other Treatments:     Therapy/Group: Individual Therapy  Tonny Branch 10/25/2018, 1:52 PM
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
## 2018-10-25 NOTE — Progress Notes (Signed)
Physical Therapy Session Note  Patient Details  Name: Bobby [name] MRN: [id number] Date of Birth: 1975-09-25  Today's Date: 10/25/2018 PT Individual Time: 4696-2952 PT Individual Time Calculation (min): 41 min   Short Term Goals: Week 1:  PT Short Term Goal 1 (Week 1): STG = LTG due to short ELOS.  Skilled Therapeutic Interventions/Progress Updates:  Pt received in recliner & agreeable to tx. Spoke with pt's wife Revonda Standard) via speaker phone alongside pt; therapist educated Revonda Standard on pt's CLOF at supervision level with RW for pain management, DME recommendations, & f/u HHPT with Revonda Standard voicing understanding of all information. Also educated them on pt's inability to drive until cleared by MD. Pt transfers sit<>stand with mod I & ambulates room<>gym with RW & distant supervision with pt demonstrating impaired heel strike BLE, decreased L hip/knee flexion during swing phase, decreased step length BLE, & decreased stride length. Attempted to have pt ambulate to ortho gym but pt required seated rest break outside of main gym 2/2 back tightness & fatigue, pt also reports feeling hot. After rest break pt returns to room & therapist educated him on recovery and progress. Pt left in recliner with all needs at hand & BLE elevated  Pt c/o unrated back pain, k-pad applied to low back at end of session.  Therapy Documentation Precautions:  Precautions Precautions: Fall Precaution Comments: low stimulation environment, HA, light and sound sensitive Restrictions Weight Bearing Restrictions: No     Therapy/Group: Individual Therapy  Sandi Mariscal 10/25/2018, 12:28 PM

## 2018-10-25 NOTE — Progress Notes (Signed)
Home CPAP at bedside 

## 2018-10-25 NOTE — Progress Notes (Signed)
Speech Language Pathology Daily Session Note  Patient Details  Name: JADEON KEYTON MRN: [id number] Date of Birth: 10-Aug-1975  Today's Date: 10/25/2018 SLP Individual Time: 5056-9794 SLP Individual Time Calculation (min): 55 min  Short Term Goals: Week 1: SLP Short Term Goal 1 (Week 1): STGs=LTGs due to short length of stay   Skilled Therapeutic Interventions: Skilled treatment session focused on cognitive goals. SLP facilitated session by providing supervision level verbal cues for recall of his current medications and their functions and for complex problem solving during a medication management task of organizing a QD pill box. SLP also facilitated session by educating patient on memory compensatory strategies and how to implement the strategies at home to maximize recall of functional information. He verbalized understanding. Patient requested to use the bathroom and left on [facility] with all needs within reach. Continue with current plan of care.      Pain Pain Assessment Pain Scale: 0-10 Pain Score: 0-No pain Faces Pain Scale: No hurt  Therapy/Group: Individual Therapy  Quanetta Truss 10/25/2018, 12:22 PM

## 2018-10-26 ENCOUNTER — Inpatient Hospital Stay (HOSPITAL_COMMUNITY): Payer: Self-pay

## 2018-10-26 ENCOUNTER — Inpatient Hospital Stay (HOSPITAL_COMMUNITY): Payer: Self-pay | Admitting: Physical Therapy

## 2018-10-26 ENCOUNTER — Inpatient Hospital Stay (HOSPITAL_COMMUNITY): Payer: Self-pay | Admitting: Speech Pathology

## 2018-10-26 LAB — BASIC METABOLIC PANEL
Anion gap: 10 (ref 5–15)
BUN: 15 mg/dL (ref 6–20)
CO2: 22 mmol/L (ref 22–32)
Calcium: 9 mg/dL (ref 8.9–10.3)
Chloride: 102 mmol/L (ref 98–111)
Creatinine, Ser: 1.21 mg/dL (ref 0.61–1.24)
GFR calc Af Amer: >60 mL/min (ref >60)
GFR calc non Af Amer: >60 mL/min (ref >60)
Glucose, Bld: 102 mg/dL — ABNORMAL HIGH (ref 70–99)
Potassium: 4.2 mmol/L (ref 3.5–5.1)
Sodium: 134 mmol/L — ABNORMAL LOW (ref 135–145)

## 2018-10-26 MED ORDER — CITALOPRAM HYDROBROMIDE 40 MG PO TABS: ORAL_TABLET | ORAL | 1 refills | Status: DC

## 2018-10-26 MED ORDER — ACETAMINOPHEN 325 MG PO TABS: 650.0000 mg | ORAL_TABLET | Freq: Four times a day (QID) | ORAL | Status: DC

## 2018-10-26 MED ORDER — ATORVASTATIN CALCIUM 20 MG PO TABS: 20.0000 mg | ORAL_TABLET | Freq: Every day | ORAL | 1 refills | Status: DC

## 2018-10-26 MED ORDER — TOPIRAMATE 50 MG PO TABS: 50.0000 mg | ORAL_TABLET | Freq: Two times a day (BID) | ORAL | 0 refills | Status: DC

## 2018-10-26 MED ORDER — DOCUSATE SODIUM 100 MG PO CAPS: 200.0000 mg | ORAL_CAPSULE | Freq: Two times a day (BID) | ORAL | 0 refills | Status: DC

## 2018-10-26 MED ORDER — CITALOPRAM HYDROBROMIDE 40 MG PO TABS: 40.0000 mg | ORAL_TABLET | Freq: Every day | ORAL | 0 refills | Status: DC

## 2018-10-26 MED ORDER — POLYETHYLENE GLYCOL 3350 17 G PO PACK: 17.0000 g | PACK | Freq: Every day | ORAL | 0 refills | Status: DC

## 2018-10-26 MED ORDER — BUTALBITAL-APAP-CAFFEINE 50-325-40 MG PO TABS: 1.0000 | ORAL_TABLET | Freq: Four times a day (QID) | ORAL | 0 refills | Status: DC | PRN

## 2018-10-26 MED ORDER — TRAZODONE HCL 100 MG PO TABS: 100.0000 mg | ORAL_TABLET | Freq: Every day | ORAL | 0 refills | Status: DC

## 2018-10-26 MED ORDER — METHOCARBAMOL 500 MG PO TABS: 500.0000 mg | ORAL_TABLET | Freq: Four times a day (QID) | ORAL | 0 refills | Status: DC

## 2018-10-26 MED ORDER — OXYCODONE HCL 5 MG PO TABS: 5.0000 mg | ORAL_TABLET | Freq: Four times a day (QID) | ORAL | 0 refills | Status: DC | PRN

## 2018-10-26 NOTE — Progress Notes (Signed)
Speech Language Pathology Discharge Summary  Patient Details  Name: Bobby [name] MRN: [id number] Date of Birth: 1975-06-17  Today's Date: 10/26/2018 SLP Individual Time: 1135-1200 SLP Individual Time Calculation (min): 25 min   Skilled Therapeutic Interventions:  Skilled treatment session focused on completion of patient and family education with the patient and his wife (via telephone). Both were educated on patient's current cognitive impairments and strategies to utilize at home to maximize short-term recall and overall safety with functional tasks. Both were also educated in regards to patient's current changes in behavior (low frustration tolerance, irritability, etc) and strategies to minimize frustration. Both verbalized understanding and agreement. Patient left upright in recliner with all needs within reach. Continue with current plan of care.   Patient has met 3 of 3 long term goals.  Patient to discharge at overall Supervision;Modified Independent level.   Reasons goals not met: N/A   Clinical Impression/Discharge Summary: Patient has made excellent gains and has met 3 of 3 LTGs this admission. Currently, patient is demonstrating behaviors consistent with a Rancho Level VIII and is overall Supervision-Mod I to complete functional and mildly complex tasks safely in regards to problem solving, recall with use of memory compensatory strategies, and attention. Patient and family education is complete (via telephone) and patient will discharge home with 24 hour supervision from family. Patient would benefit from f/u SLP services to maximize his cognitive functioning in order to return to prior level of cognitive functioning.   Care Partner:  Caregiver Able to Provide Assistance: Yes  Type of Caregiver Assistance: Cognitive  Recommendation:  24 hour supervision/assistance;Home Health SLP  Rationale for SLP Follow Up: Maximize cognitive function and independence;Reduce caregiver burden    Equipment: N/A    Reasons for discharge: Treatment goals met;Discharged from hospital   Patient/Family Agrees with Progress Made and Goals Achieved: Yes    [place], [place] 10/26/2018, 6:50 AM

## 2018-10-26 NOTE — Progress Notes (Signed)
Physical Therapy Session Note  Patient Details  Name: GEORDAN BRASHERS MRN: [id number] Date of Birth: [birth year]  Today's Date: 10/26/2018 PT Individual Time: 1032-1129 PT Individual Time Calculation (min): 57 min   Short Term Goals: Week 1:  PT Short Term Goal 1 (Week 1): STG = LTG due to short ELOS.  Skilled Therapeutic Interventions/Progress Updates:      Therapy Documentation Precautions:  Precautions Precautions: Fall Precaution Comments: low stimulation environment, HA, light and sound sensitive Restrictions Weight Bearing Restrictions: No General:   Vital Signs: Therapy Vitals Temp: 98.5 F (36.9 C) Temp Source: Oral Pulse Rate: 83 Resp: 20 BP: (!) 156/111 Patient Position (if appropriate): Sitting Oxygen Therapy SpO2: 98 % O2 Device: Room Air  Treatment:  Pt seated in recliner on arrival with feet up. Agreeable to PT at this time. Denies any pain currently, does report lower back/buttock "tightness" at times.   Assist needed to bring back of recliner forward and lower foot rest to floor. Pt able to stand from recliner to [facility] without any assist or cues needed. Gait in room with WRW around bed to side that is near window Mod I. Mod I for pt to sit on edge of bed. Pt was able to go from sitting edge of bed to supine Mod I to I with bed flat. Does use headboard as he plans to do so at home. Pt also able to go from supine to sitting edge of bed Mod I to I as with significant incr in time for planning out best approach due to rib/sternum pain. Verbal discussion with PTA for problem solving through this needed. Pt ended up partially rolling onto right side and using headboard was able to elevate trunk into sitting at edge of bed. Pt able to stand and pivot bed<>wheelchair with no AD, minimal use of hands once up, no assist needed. With WRW pt ambulated 290 feet with 2 short standing rest breaks at Mod I level. Returned to recliner in room and performed the following exercises  with bil LE's: hamstring stretching with belt assist for 30 sec's x 3 each side. With 3# ankle weight long arc quads and marching for 2 sets of 10 reps each. Increased time needed with most all mobility and redirection to task throughout session as pt tends to be verbose and go on tangents at times.  Pt left with feet support on aerobic step and all needs in reach ready for OT session after this one.     Therapy/Group: Individual Therapy  Livingston Diones, PTA, CLT 10/26/18, 4:53 PM 10/26/2018, 4:53 PM

## 2018-10-26 NOTE — Progress Notes (Signed)
Occupational Therapy Discharge Summary  Patient Details  Name: Bobby [name] MRN: [id number] Date of Birth: Aug 28, 1975  Today's Date: 10/26/2018 OT Individual Time: 9798-9211 OT Individual Time Calculation (min): 57 min   Session 1: Pt received in bed agreeable to OT. Pt sits EOB to finish breakfast while OT discusses d/t, sock sonning and IADl participation. Pt attempts to don socks EOB as done at home but unable d/t decreased hip and knee flexibility at EOB. Demo sock slider for wide feet, however unable to complete d/t grip on sock. Pt dons shirt and shorts with set up. Pt completes standing oral care with MOD I. Pt ambulates to ADL kitchen and makes cup of coffee with MOD I at ambulatory elvel. Exited session with pt seated in bed, exit alarm on and all needs met  Today's Date: 10/26/2018 OT Individual Time: 9417-4081 OT Individual Time Calculation (min): 40 min   Session 2: Focus on discharge planning, family education with wife and back/glute stretches for pain relief. Pt received in recliner with pain in back, premedicated but not rated. Pt call wife and OT discusses pt requiring A to don footwear and cleans buttocks after BM/trialing toilet wand that wife already purchased. Pt and wife verbalized understanding pt needing supervision for transfers in/out of shower for safety. No further questions from wife/pt. OT leads pt through BLE stretches seated and standing for hamstrings, glutes, calves and low back with OT demoing options. Exited session with pt seated in recliner on telephone, call light in reach and all needs met.  Patient has met 10 of 12 long term goals due to improved activity tolerance, improved balance, postural control, ability to compensate for deficits, improved attention, improved awareness and improved coordination.  Patient to discharge at overall Supervision level.  Patient's care partner is independent to provide the necessary physical assistance at discharge.     Reasons goals not met: Pt did not meet toileting or footwear goal d/t body habitus impacting adequate reaching.  Recommendation:  Patient will benefit from ongoing skilled OT services in outpatient setting to continue to advance functional skills in the area of BADL and iADL.  Equipment: Bariatric TTB  Reasons for discharge: treatment goals met  Patient/family agrees with progress made and goals achieved: Yes  OT Discharge Precautions/Restrictions  Precautions Precautions: Fall Precaution Comments: low stimulation environment, HA, light and sound sensitive Restrictions Weight Bearing Restrictions: No General   Vital Signs Therapy Vitals Temp: 98.5 F (36.9 C) Temp Source: Oral Pulse Rate: 74 Resp: 18 BP: (!) 158/79 Patient Position (if appropriate): Lying Oxygen Therapy SpO2: 98 % O2 Device: Room Air Pain Pain Assessment Pain Score: 0-No pain ADL ADL Eating: Independent Where Assessed-Eating: Bed level Grooming: Modified independent Where Assessed-Grooming: Chair Upper Body Bathing: Setup Where Assessed-Upper Body Bathing: Shower Lower Body Bathing: Supervision/safety Where Assessed-Lower Body Bathing: Shower Upper Body Dressing: Setup Where Assessed-Upper Body Dressing: Chair Lower Body Dressing: Setup Where Assessed-Lower Body Dressing: Chair Toileting: Minimal assistance Toilet Transfer: Modified independent Toilet Transfer Method: Counselling psychologist: Extra wide bedside commode Tub/Shower Transfer: Not assessed Social research officer, government: Close supervision ADL Comments: patient required rest breaks with minimal activity required for sponge bath seated in recliner due to HA  Vision Baseline Vision/History: Wears glasses Wears Glasses: At all times Vision Assessment?: Yes Eye Alignment: Within Functional Limits Ocular Range of Motion: Within Functional Limits Alignment/Gaze Preference: Within Defined Limits Tracking/Visual Pursuits:  Able to track stimulus in all quads without difficulty Saccades: Within functional limits Convergence: Within functional  limits Visual Fields: No apparent deficits Perception  Perception: Within Functional Limits Praxis Praxis: Intact Cognition Overall Cognitive Status: Impaired/Different from baseline Arousal/Alertness: Awake/alert Orientation Level: Oriented X4 Attention: Sustained Sustained Attention: Appears intact Sustained Attention Impairment: Functional basic Memory: Impaired Memory Impairment: Decreased short term memory Awareness: Appears intact Problem Solving: Impaired Problem Solving Impairment: Functional complex Behaviors: Poor frustration tolerance Safety/Judgment: Appears intact [employer] Scales of Cognitive Functioning: Purposeful/appropriate Sensation Sensation Light Touch: Appears Intact Proprioception: Appears Intact Coordination Gross Motor Movements are Fluid and Coordinated: Yes Fine Motor Movements are Fluid and Coordinated: Yes Motor  Motor Motor: Within Functional Limits Motor - Skilled Clinical Observations: generalized deconditioning Motor - Discharge Observations: generalized deconditioning Mobility  Transfers Sit to Stand: Independent Stand to Sit: Independent  Trunk/Postural Assessment  Cervical Assessment Cervical Assessment: Within Functional Limits Thoracic Assessment Thoracic Assessment: Within Functional Limits Lumbar Assessment Lumbar Assessment: Exceptions to WFL(posterior pelvic tilt) Postural Control Postural Control: Deficits on evaluation Righting Reactions: slightly delayed Protective Responses: delayed  Balance Balance Balance Assessed: Yes Static Standing Balance Static Standing - Level of Assistance: 6: Modified independent (Device/Increase time) Dynamic Standing Balance Dynamic Standing - Level of Assistance: 6: Modified independent (Device/Increase time) Extremity/Trunk Assessment RUE Assessment RUE  Assessment: Within Functional Limits Active Range of Motion (AROM) Comments: WFL t/o - mild rib cage discomfort LUE Assessment LUE Assessment: Within Functional Limits Active Range of Motion (AROM) Comments: able to achieve full reach over head with support of recliner behind shoulder, mild rib cage discomfort   Tonny Branch 10/26/2018, 7:14 AM

## 2018-10-26 NOTE — Progress Notes (Signed)
Social Work Patient ID: Bobby [name], male   DOB: 12/25/1975, 43 y.o.   MRN: [id number]  Pt and wife looking forward to d/c tomorrow.  Have reviewed follow up and DME needs.  Continue to follow.  Evah Rashid, LCSW
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
## 2018-10-26 NOTE — Plan of Care (Signed)
  Problem: Consults Goal: RH GENERAL PATIENT EDUCATION Description See Patient Education module for education specifics. Outcome: Progressing   Problem: RH BOWEL ELIMINATION Goal: RH STG MANAGE BOWEL WITH ASSISTANCE Description STG Manage Bowel with Mod I Assistance.  Outcome: Progressing Goal: RH STG MANAGE BOWEL W/MEDICATION W/ASSISTANCE Description STG Manage Bowel with Medication with Min Assistance.  Outcome: Progressing   Problem: RH SKIN INTEGRITY Goal: RH STG SKIN FREE OF INFECTION/BREAKDOWN Description No new breakdown with min assist   Outcome: Progressing Goal: RH STG MAINTAIN SKIN INTEGRITY WITH ASSISTANCE Description STG Maintain Skin Integrity With min Assistance.  Outcome: Progressing   Problem: RH SAFETY Goal: RH STG ADHERE TO SAFETY PRECAUTIONS W/ASSISTANCE/DEVICE Description STG Adhere to Safety Precautions With Min Assistance/Device.  Outcome: Progressing   Problem: RH PAIN MANAGEMENT Goal: RH STG PAIN MANAGED AT OR BELOW PT'S PAIN GOAL Description <3 out of 10.   Outcome: Progressing   Problem: RH Vision Goal: RH LTG Vision (Specify) Outcome: Progressing   

## 2018-10-26 NOTE — Progress Notes (Signed)
Home CPAP at bedside. No assistance needed at this time. Advised pt to notify for RT if any further assistance is required.

## 2018-10-26 NOTE — Discharge Instructions (Signed)
Inpatient Rehab Discharge Instructions  Bobby [name] Discharge date and time: No discharge date for patient encounter.   Activities/Precautions/ Functional Status: Activity: activity as tolerated Diet: regular diet Wound Care: none needed Functional status:  ___ No restrictions     ___ Walk up steps independently ___ 24/7 supervision/assistance   ___ Walk up steps with assistance ___ Intermittent supervision/assistance  ___ Bathe/dress independently ___ Walk with walker     _x__ Bathe/dress with assistance ___ Walk Independently    ___ Shower independently ___ Walk with assistance    ___ Shower with assistance ___ No alcohol     ___ Return to work/school ________     COMMUNITY REFERRALS UPON DISCHARGE:    Home Health:   PT     OT     ST                        Agency:  [facility] Phone: [phone]   Medical Equipment/Items Ordered:  Tub bench                                                       Agency/Supplier:  [facility] @ [phone]    GENERAL COMMUNITY RESOURCES FOR PATIENT/FAMILY:  Support Groups:  [facility] (see flyer)      Special Instructions:  No smoking driving or alcohol  My questions have been answered and I understand these instructions. I will adhere to these goals and the provided educational materials after my discharge from the hospital.  Patient/Caregiver Signature _______________________________ Date __________  Clinician Signature _______________________________________ Date __________  Please bring this form and your medication list with you to all your follow-up doctor's appointments.

## 2018-10-27 LAB — CBC
HCT: 37.5 % — ABNORMAL LOW (ref 39.0–52.0)
Hemoglobin: 12.3 g/dL — ABNORMAL LOW (ref 13.0–17.0)
MCH: 27.7 pg (ref 26.0–34.0)
MCHC: 32.8 g/dL (ref 30.0–36.0)
MCV: 84.5 fL (ref 80.0–100.0)
Platelets: 365 10*3/uL (ref 150–400)
RBC: 4.44 MIL/uL (ref 4.22–5.81)
RDW: 14.6 % (ref 11.5–15.5)
WBC: 10.6 10*3/uL — ABNORMAL HIGH (ref 4.0–10.5)
nRBC: 0 % (ref 0.0–0.2)

## 2018-10-27 NOTE — Progress Notes (Signed)
Social Work  Discharge Note  The overall goal for the admission was met for:   Discharge location: Yes - home with wife   Length of Stay: Yes - 8 days  Discharge activity level: Yes - mod independent to supervision  Home/community participation: Yes  Services provided included: MD, RD, PT, OT, SLP, RN, TR, Pharmacy, Neuropsych and SW  Financial Services: None  Follow-up services arranged: Home Health: PT, OT, ST via [place], DME: tub bench via [place] and Patient/Family has no preference for HH/DME agencies  Comments (or additional information):      Contact info:  Pt at [phone]             Wife, Abhi Moccia, @ [phone]  Patient/Family verbalized understanding of follow-up arrangements: Yes  Individual responsible for coordination of the follow-up plan: pt  Confirmed correct DME delivered: Lennart Pall 10/27/2018    Opaline Reyburn

## 2018-10-27 NOTE — Plan of Care (Signed)
  Problem: Consults Goal: RH GENERAL PATIENT EDUCATION Description See Patient Education module for education specifics. Outcome: Progressing   Problem: RH BOWEL ELIMINATION Goal: RH STG MANAGE BOWEL WITH ASSISTANCE Description STG Manage Bowel with Mod I Assistance.  Outcome: Progressing Goal: RH STG MANAGE BOWEL W/MEDICATION W/ASSISTANCE Description STG Manage Bowel with Medication with Min Assistance.  Outcome: Progressing   Problem: RH SKIN INTEGRITY Goal: RH STG SKIN FREE OF INFECTION/BREAKDOWN Description No new breakdown with min assist   Outcome: Progressing Goal: RH STG MAINTAIN SKIN INTEGRITY WITH ASSISTANCE Description STG Maintain Skin Integrity With min Assistance.  Outcome: Progressing   Problem: RH SAFETY Goal: RH STG ADHERE TO SAFETY PRECAUTIONS W/ASSISTANCE/DEVICE Description STG Adhere to Safety Precautions With Min Assistance/Device.  Outcome: Progressing   Problem: RH PAIN MANAGEMENT Goal: RH STG PAIN MANAGED AT OR BELOW PT'S PAIN GOAL Description <3 out of 10.   Outcome: Progressing

## 2018-10-27 NOTE — Progress Notes (Signed)
Pt discharged home with family. Discharge instructions given by Jesusita Oka, PA. No further questions from family or patient. Belongings sent with pt. Pt stable and in no distress at time of discharge.   Nyrah Demos W Henryk Ursin
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
## 2018-10-30 ENCOUNTER — Telehealth: Payer: Self-pay

## 2018-10-30 ENCOUNTER — Telehealth: Payer: Self-pay | Admitting: *Deleted

## 2018-10-30 NOTE — Telephone Encounter (Signed)
Made 1st attempt for transitional care call. I called patient's listed number [phone], LVM to please call our office back

## 2018-10-30 NOTE — Telephone Encounter (Signed)
Stacy, PT from [facility] called requesting verbal orders for HHPT 1wk1, 2wk2. Orders approved and given per discharge summary. 

## 2018-10-30 NOTE — Telephone Encounter (Signed)
Transitional call completed, appointment confirmed, address confirmed, new patient packet mailed  Transitional Care Questions   Questions for our staff to ask patients on Transitional care 48 hour phone call:   1. Are you/is patient experiencing any problems since coming home? Pain is still flaring up, lower back, knee, road rash, patient feels he is making good progress despite temporary setbacks Are there any questions regarding any aspect of care? No  2. Are there any questions regarding medications administration/dosing? No Are meds being taken as prescribed? Patient is taking medications as prescribed. He explains that he tries to not use pain meds during the day. He takes one in the evening to help with sleep as this is when it is most difficult to control pain by other means Patient should review meds with caller to confirm   3. Have there been any falls? No  4. Has Home Health been to the house and/or have they contacted you? Yes, ST and PT have been to the house If not, have you tried to contact them? Can we help you contact them?   5. Are bowels and bladder emptying properly? Yes, for the most part, bowel meds cause occasionally issues of urgency. Are there any unexpected incontinence issues? no If applicable, is patient following bowel/bladder programs?   6. Any fevers, problems with breathing, unexpected pain? No  7. Are there any skin problems or new areas of breakdown?  Wound (road rash) is healing nicely, no signs of infection  8. Has the patient/family member arranged specialty MD follow up (ie cardiology/neurology/renal/surgical/etc)? Patient will contact Can we help arrange?   9. Does the patient need any other services or support that we can help arrange? No  10. Are caregivers following through as expected in assisting the patient? Yes  11. Has the patient quit smoking, drinking alcohol, or using drugs as recommended? Patient states that he does not do these things

## 2018-10-30 NOTE — Telephone Encounter (Signed)
Darel Hong, PT from [facility] called stating only a evaluation was done on pt. No need for any further speech intervention.

## 2018-10-31 ENCOUNTER — Telehealth: Payer: Self-pay

## 2018-10-31 NOTE — Telephone Encounter (Signed)
LM requesting call back to complete TCM and schedule hospital follow up.   

## 2018-11-08 ENCOUNTER — Encounter: Payer: Self-pay | Admitting: Registered Nurse

## 2018-11-08 ENCOUNTER — Other Ambulatory Visit: Payer: Self-pay

## 2018-11-08 ENCOUNTER — Encounter: Payer: Self-pay | Attending: Registered Nurse | Admitting: Registered Nurse

## 2018-11-08 VITALS — BP 129/83 | HR 82 | Resp 14 | Ht 73.0 in | Wt >= 6400 oz

## 2018-11-08 DIAGNOSIS — Z6841 Body Mass Index (BMI) 40.0 and over, adult: Secondary | ICD-10-CM | POA: Insufficient documentation

## 2018-11-08 DIAGNOSIS — Z87891 Personal history of nicotine dependence: Secondary | ICD-10-CM | POA: Insufficient documentation

## 2018-11-08 DIAGNOSIS — Z808 Family history of malignant neoplasm of other organs or systems: Secondary | ICD-10-CM | POA: Insufficient documentation

## 2018-11-08 DIAGNOSIS — I609 Nontraumatic subarachnoid hemorrhage, unspecified: Secondary | ICD-10-CM | POA: Insufficient documentation

## 2018-11-08 DIAGNOSIS — Z803 Family history of malignant neoplasm of breast: Secondary | ICD-10-CM | POA: Insufficient documentation

## 2018-11-08 DIAGNOSIS — S069X0A Unspecified intracranial injury without loss of consciousness, initial encounter: Secondary | ICD-10-CM

## 2018-11-08 DIAGNOSIS — Z8249 Family history of ischemic heart disease and other diseases of the circulatory system: Secondary | ICD-10-CM | POA: Insufficient documentation

## 2018-11-08 DIAGNOSIS — E7849 Other hyperlipidemia: Secondary | ICD-10-CM

## 2018-11-08 DIAGNOSIS — Z8349 Family history of other endocrine, nutritional and metabolic diseases: Secondary | ICD-10-CM | POA: Insufficient documentation

## 2018-11-08 DIAGNOSIS — Z800 Family history of malignant neoplasm of digestive organs: Secondary | ICD-10-CM | POA: Insufficient documentation

## 2018-11-08 DIAGNOSIS — G4733 Obstructive sleep apnea (adult) (pediatric): Secondary | ICD-10-CM | POA: Insufficient documentation

## 2018-11-08 DIAGNOSIS — Z806 Family history of leukemia: Secondary | ICD-10-CM | POA: Insufficient documentation

## 2018-11-08 DIAGNOSIS — E785 Hyperlipidemia, unspecified: Secondary | ICD-10-CM | POA: Insufficient documentation

## 2018-11-08 DIAGNOSIS — I10 Essential (primary) hypertension: Secondary | ICD-10-CM | POA: Insufficient documentation
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
## 2018-11-08 NOTE — Patient Instructions (Signed)
Please call Dr Jordan Likes office for hospital follow up appointment: [phone]  Please call Dr. Claiborne Billings office for hospital follow up appointment : [phone]

## 2018-11-21 DIAGNOSIS — I10 Essential (primary) hypertension: Secondary | ICD-10-CM

## 2018-11-21 DIAGNOSIS — F329 Major depressive disorder, single episode, unspecified: Secondary | ICD-10-CM

## 2018-11-21 DIAGNOSIS — S066X1D Traumatic subarachnoid hemorrhage with loss of consciousness of 30 minutes or less, subsequent encounter: Secondary | ICD-10-CM

## 2018-11-21 DIAGNOSIS — F909 Attention-deficit hyperactivity disorder, unspecified type: Secondary | ICD-10-CM

## 2018-11-21 DIAGNOSIS — Z6841 Body Mass Index (BMI) 40.0 and over, adult: Secondary | ICD-10-CM

## 2018-11-21 DIAGNOSIS — S2242XD Multiple fractures of ribs, left side, subsequent encounter for fracture with routine healing: Secondary | ICD-10-CM

## 2019-01-03 ENCOUNTER — Encounter: Payer: MEDICAID | Attending: Registered Nurse | Admitting: Physical Medicine & Rehabilitation

## 2019-01-03 DIAGNOSIS — Z87891 Personal history of nicotine dependence: Secondary | ICD-10-CM | POA: Insufficient documentation

## 2019-01-03 DIAGNOSIS — Z803 Family history of malignant neoplasm of breast: Secondary | ICD-10-CM | POA: Insufficient documentation

## 2019-01-03 DIAGNOSIS — Z6841 Body Mass Index (BMI) 40.0 and over, adult: Secondary | ICD-10-CM | POA: Insufficient documentation

## 2019-01-03 DIAGNOSIS — I609 Nontraumatic subarachnoid hemorrhage, unspecified: Secondary | ICD-10-CM | POA: Insufficient documentation

## 2019-01-03 DIAGNOSIS — I10 Essential (primary) hypertension: Secondary | ICD-10-CM | POA: Insufficient documentation

## 2019-01-03 DIAGNOSIS — E785 Hyperlipidemia, unspecified: Secondary | ICD-10-CM | POA: Insufficient documentation

## 2019-01-03 DIAGNOSIS — Z800 Family history of malignant neoplasm of digestive organs: Secondary | ICD-10-CM | POA: Insufficient documentation

## 2019-01-03 DIAGNOSIS — Z8349 Family history of other endocrine, nutritional and metabolic diseases: Secondary | ICD-10-CM | POA: Insufficient documentation

## 2019-01-03 DIAGNOSIS — Z808 Family history of malignant neoplasm of other organs or systems: Secondary | ICD-10-CM | POA: Insufficient documentation

## 2019-01-03 DIAGNOSIS — G4733 Obstructive sleep apnea (adult) (pediatric): Secondary | ICD-10-CM | POA: Insufficient documentation

## 2019-01-03 DIAGNOSIS — Z8249 Family history of ischemic heart disease and other diseases of the circulatory system: Secondary | ICD-10-CM | POA: Insufficient documentation

## 2019-01-03 DIAGNOSIS — Z806 Family history of leukemia: Secondary | ICD-10-CM | POA: Insufficient documentation

## 2019-01-09 ENCOUNTER — Ambulatory Visit: Payer: Self-pay | Admitting: Family Medicine

## 2019-01-11 ENCOUNTER — Ambulatory Visit: Payer: Self-pay | Admitting: Family Medicine

## 2019-01-11 ENCOUNTER — Encounter: Payer: Self-pay | Admitting: Family Medicine

## 2019-01-11 ENCOUNTER — Other Ambulatory Visit: Payer: Self-pay

## 2019-01-11 VITALS — BP 138/84 | HR 70 | Temp 97.2°F | Resp 18 | Ht 73.0 in | Wt >= 6400 oz

## 2019-01-11 DIAGNOSIS — S065X9A Traumatic subdural hemorrhage with loss of consciousness of unspecified duration, initial encounter: Secondary | ICD-10-CM

## 2019-01-11 DIAGNOSIS — F985 Adult onset fluency disorder: Secondary | ICD-10-CM

## 2019-01-11 DIAGNOSIS — F418 Other specified anxiety disorders: Secondary | ICD-10-CM

## 2019-01-11 DIAGNOSIS — M25512 Pain in left shoulder: Secondary | ICD-10-CM

## 2019-01-11 DIAGNOSIS — R41840 Attention and concentration deficit: Secondary | ICD-10-CM

## 2019-01-11 DIAGNOSIS — S069X1A Unspecified intracranial injury with loss of consciousness of 30 minutes or less, initial encounter: Secondary | ICD-10-CM

## 2019-01-11 DIAGNOSIS — M542 Cervicalgia: Secondary | ICD-10-CM

## 2019-01-11 DIAGNOSIS — S065XAA Traumatic subdural hemorrhage with loss of consciousness status unknown, initial encounter: Secondary | ICD-10-CM

## 2019-01-11 DIAGNOSIS — I609 Nontraumatic subarachnoid hemorrhage, unspecified: Secondary | ICD-10-CM

## 2019-01-11 DIAGNOSIS — F8081 Childhood onset fluency disorder: Secondary | ICD-10-CM

## 2019-01-11 MED ORDER — CITALOPRAM HYDROBROMIDE 40 MG PO TABS: ORAL_TABLET | ORAL | 1 refills | Status: DC

## 2019-01-11 MED ORDER — METHOCARBAMOL 500 MG PO TABS: 500.0000 mg | ORAL_TABLET | Freq: Four times a day (QID) | ORAL | 5 refills | Status: DC

## 2019-01-11 NOTE — Patient Instructions (Addendum)
Glad your are ok. I have refilled your Celexa and muscle relaxer Please continue follow ups with your neurologist and specialist.   When you get insurance we can certainly start more work up on your shoulder and knee... and hip.      Traumatic Brain Injury Traumatic brain injury (TBI) is an injury to the brain that results from:  A hard, direct blow to the head (closed injury).  An object penetrating the skull and entering the brain (open injury). Traumatic brain injury is also called a head injury or a concussion. TBI can be mild, moderate, or severe. What are the causes? Common causes of this condition include:  Falls.  Motor vehicle accidents.  Sports injuries.  Assaults. What increases the risk? You are more likely to develop this condition if you:  Are 65 years old or older.  Are a man.  Play contact sports, especially football, hockey, or soccer.  Are in the [employer]  Are a victim of violence.  Abuse drugs or alcohol.  Have had a previous TBI. What are the signs or symptoms? Symptoms may vary from person to person, and may include:  Loss of consciousness.  Headache.  Confusion.  Fatigue.  Changes in sleep.  Dizziness.  Mood or personality changes.  Memory problems.  Nausea or vomiting or both.  Seizures.  Clumsiness.  Slurred speech.  Depression and anxiety.  Anger.  Trouble concentrating, organizing, or making decisions.  Inability to control emotions or actions (impulse control).  Loss of or dulling of the senses, such as hearing, vision, and touch. This can include: ? Blurred vision. ? Ringing in your ears. How is this diagnosed? This condition may be diagnosed based on:  Medical history and physical exam.  Neurologic exam. This checks for brain and nervous system function, including your reflexes, memory, and coordination.  CT scan. Your TBI may be described as mild, moderate, or severe. How is this  treated? Treatment depends on the severity of your brain injury and may include:  Breathing support (mechanical ventilation).  Blood pressure medicines.  Pain medicines.  Treatments to decrease the swelling in your brain.  Brain surgery. This may be needed to: ? Remove a blood clot. ? Repair bleeding. ? Remove an object that has penetrated the brain, such as a skull fragment or a bullet. Treatment of TBI also includes:  Physical and mental rest.  Careful observation.  Medicine. You may be prescribed medicines to help with symptoms such as headaches, nausea, or difficulty sleeping.  Physical, occupational, and speech therapy.  Referral to a concussion clinic or rehabilitation center. Follow these instructions at home: Medicines  Take over-the-counter and prescription medicines only as told by your health care provider.  Do not take blood thinners (anticoagulants), aspirin, or other anti-inflammatory medicines such as ibuprofen or naproxen unless approved by your health care provider. Activity  Rest. Rest helps the brain to heal. Make sure you: ? Get plenty of sleep. Most adults should get 7-9 hours of sleep each night. ? Rest during the day. Take daytime naps or rest breaks when you feel tired.  Do not do high-risk activities that could cause a second concussion, such as riding a bike or playing sports. Having another concussion before the first one has healed can be dangerous.  Avoid a lot of visual stimulation. This includes work on the computer or phone, watching TV, and reading.  Ask your health care provider what kind of activities are safe for you. Your ability to react may  be slower after a brain injury. Never do these activities if you are dizzy. Your health care provider will likely give you a plan for gradually returning to activities. General instructions  Do not drink alcohol.  Watch your symptoms and tell others to do the same. Complications sometimes occur  after a brain injury. Older adults with a brain injury may have a higher risk of serious complications.  Seek support from friends and family.  Keep all follow-up visits as directed by your health care provider. This is important. Contact a health care provider if:  Your symptoms get worse or they do not improve.  You have new symptoms.  You have another injury. Get help right away if:  You have: ? Severe, persistent headaches that are not relieved by medicine. ? Weakness or numbness in any part of your body. ? Confusion. ? Slurred speech. ? Difficulty waking up. ? Nausea or persistent vomiting. ? A feeling like you are moving when you are not (vertigo). ? Seizures or you faint. ? Changes in your vision. ? Clear or bloody discharge from your nose or ears.  You cannot use your arms or legs normally. Summary  Traumatic brain injury happens when there is a hard, direct blow to the head or when an object penetrates the skull and enters the brain.  Traumatic brain injuries may be mild, moderate, or severe. Treatment depends on the severity of your injury.  Get help right away if you have a head injury and you develop seizures, confusion, vomiting, weakness in the arms or legs, slurred speech, and other symptoms.  Rest is one of the best treatments. Do not return to activity until your health care provider approves. This information is not intended to replace advice given to you by your health care provider. Make sure you discuss any questions you have with your health care provider. Document Released: 05/21/2002 Document Revised: 10/05/2017 Document Reviewed: 07/18/2017 Elsevier Patient Education  2020 [employer]
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
## 2019-01-12 ENCOUNTER — Encounter: Payer: Self-pay | Admitting: Family Medicine

## 2019-01-16 ENCOUNTER — Encounter: Payer: Self-pay | Admitting: Neurology

## 2019-02-16 ENCOUNTER — Ambulatory Visit: Payer: Self-pay | Admitting: Neurology
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
## 2019-03-07 ENCOUNTER — Ambulatory Visit (INDEPENDENT_AMBULATORY_CARE_PROVIDER_SITE_OTHER): Payer: Self-pay | Admitting: Neurology

## 2019-03-07 ENCOUNTER — Other Ambulatory Visit: Payer: Self-pay

## 2019-03-07 ENCOUNTER — Encounter: Payer: Self-pay | Admitting: Neurology

## 2019-03-07 VITALS — BP 123/92 | HR 76 | Ht 73.0 in | Wt >= 6400 oz

## 2019-03-07 DIAGNOSIS — S069X3A Unspecified intracranial injury with loss of consciousness of 1 hour to 5 hours 59 minutes, initial encounter: Secondary | ICD-10-CM

## 2019-03-07 MED ORDER — NORTRIPTYLINE HCL 10 MG PO CAPS: 10.0000 mg | ORAL_CAPSULE | Freq: Every day | ORAL | 3 refills | Status: DC

## 2019-03-07 NOTE — Patient Instructions (Addendum)
-  Start Nortriptyline 10 MG 1 tablet at bedtime.  If headaches or mood not improved in 4 weeks, contact me and we can increase dose. - Referral with Dr. Melvyn Novas for neurocognitive testing has been ordered.  - Look into disability and have papers sent to me - Follow up in 4 months.

## 2019-03-13 ENCOUNTER — Telehealth: Payer: Self-pay | Admitting: Neurology

## 2019-03-13 NOTE — Telephone Encounter (Signed)
Patient left msg with after hours about MD changing medication and he is having withdrawals from the Citalopram- dull headache, vision changes, lack of focus. Started Saturday after stopping the medication.

## 2019-03-13 NOTE — Telephone Encounter (Signed)
I never told him to stop citalopram and he shouldn't stop it cold [place].  I started him on nortriptyline.  He should contact the physician (PCP?) who was prescribing it.

## 2019-03-13 NOTE — Telephone Encounter (Signed)
Called and spoke to patient. He said he was confused and thought after appt 9/23 he was to stop the citalopram and start the nortriptyline. He has not taken the citalopram since last Wednesday.   Clarified with patient it was IN ADDITION to meds he was already taking and Dr. Tomi Likens didn't want him to stop any of them. He verbalized understanding.  Recommended patient call Dr. Lucita Lora office (who orders the citalopram) and let her know he was off it for one week in case there is any certain way he needs to resume this medication. Informed patient it is not to be stopped all the sudden. He verbalizes he will call his PCP and resume taking it per her order in addition to the nortriptyline Dr. Tomi Likens has ordered.  No further questions.

## 2019-06-25 ENCOUNTER — Encounter: Payer: Self-pay | Admitting: Neurology

## 2019-07-06 ENCOUNTER — Other Ambulatory Visit: Payer: Self-pay | Admitting: Neurology
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
## 2019-07-13 ENCOUNTER — Other Ambulatory Visit: Payer: Self-pay

## 2019-07-13 ENCOUNTER — Telehealth (INDEPENDENT_AMBULATORY_CARE_PROVIDER_SITE_OTHER): Payer: Self-pay | Admitting: Neurology

## 2019-07-13 DIAGNOSIS — S069X3A Unspecified intracranial injury with loss of consciousness of 1 hour to 5 hours 59 minutes, initial encounter: Secondary | ICD-10-CM

## 2019-08-06 ENCOUNTER — Other Ambulatory Visit: Payer: Self-pay

## 2019-08-06 ENCOUNTER — Telehealth: Payer: Self-pay | Admitting: Rehabilitation

## 2019-08-06 ENCOUNTER — Encounter: Payer: Self-pay | Admitting: Rehabilitation

## 2019-08-06 ENCOUNTER — Ambulatory Visit: Payer: BC Managed Care – PPO | Admitting: Speech Pathology

## 2019-08-06 ENCOUNTER — Ambulatory Visit: Payer: BC Managed Care – PPO | Attending: Neurology | Admitting: Rehabilitation

## 2019-08-06 DIAGNOSIS — M25562 Pain in left knee: Secondary | ICD-10-CM | POA: Insufficient documentation

## 2019-08-06 DIAGNOSIS — M25552 Pain in left hip: Secondary | ICD-10-CM

## 2019-08-06 DIAGNOSIS — R2681 Unsteadiness on feet: Secondary | ICD-10-CM | POA: Insufficient documentation

## 2019-08-06 DIAGNOSIS — R42 Dizziness and giddiness: Secondary | ICD-10-CM | POA: Diagnosis present

## 2019-08-06 DIAGNOSIS — R41841 Cognitive communication deficit: Secondary | ICD-10-CM | POA: Diagnosis present

## 2019-08-06 DIAGNOSIS — R296 Repeated falls: Secondary | ICD-10-CM | POA: Diagnosis present

## 2019-08-06 DIAGNOSIS — R2689 Other abnormalities of gait and mobility: Secondary | ICD-10-CM | POA: Insufficient documentation

## 2019-08-06 DIAGNOSIS — G8929 Other chronic pain: Secondary | ICD-10-CM

## 2019-08-06 NOTE — Telephone Encounter (Signed)
Pt was called and VM was full. Will try again later  

## 2019-08-06 NOTE — Patient Instructions (Signed)
  Get a binder with sections for PT/OT and ST  Brain Recovery Coach - online Brainline.org  Your brain injury is invisible to others, it is hard for others to understand your challenges  When brain fatigue sets in, you are more likely to loose your balance, make mistakes, more difficulty attending to details, and find the right words  You know have a limited amount of pennies to spend each day. Prioritize how you want to spend your pennies in order to maintain function throughout the day and week  Take 15 minute breaks in quiet dark room to allow your brain to reset and rest   Tips to help facilitate better attention, concentration, focus   Do harder, longer tasks when you are most alert/awake  Break down larger tasks into small parts  Limit distractions of TV, radio, conversation, e mails/texts, appliance noise, etc - if a job is important, do it in a quiet room  Be aware of how you are functioning in high stimulation environments such as large stores, parties, restaurants - any place with lots of lights, noise, signs etc  Group conversations may be more difficult to process than one on one conversations  Give yourself extra time to process conversation, reading materials, directions or information from your healthcare providers  Organization is key - clutters of laundry, mail, paperwork, dirty dishes - all make it more difficult to concentrate  Before you start a task, have all the needed supplies, directions, recipes ready and organized. This way you don't have to go looking for something in the middle of a task and become distracted.   Be aware of fatigue - take rests or breaks when needed to re-group and re-focus

## 2019-08-06 NOTE — Telephone Encounter (Signed)
Dr. Claiborne Billings, would you feel comfortable ordering this as it is outside my scope of practice?  Milana Kidney

## 2019-08-06 NOTE — Telephone Encounter (Signed)
Please call patient and have him schedule an appointment to discuss his left shoulder, left hip and left knee pain.  His physical therapist reported to his neurologist he is continuing to have left shoulder, left hip and left knee pain and suggested obtaining an MRI before continuing his therapy on these areas.  Since he has not been seen since July we would need to follow-up with him on his pain.  We could then attempt to order an MRI if felt appropriate and/or referral to orthopedic for further evaluation would be completed at that time.
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
## 2019-08-09 NOTE — Telephone Encounter (Signed)
Tried to reach patient and was unable to leave a VM

## 2019-08-12 ENCOUNTER — Other Ambulatory Visit: Payer: Self-pay | Admitting: Neurology

## 2019-08-13 ENCOUNTER — Ambulatory Visit: Payer: BC Managed Care – PPO | Attending: Neurology | Admitting: Occupational Therapy

## 2019-08-13 ENCOUNTER — Other Ambulatory Visit: Payer: Self-pay

## 2019-08-13 ENCOUNTER — Ambulatory Visit: Payer: BC Managed Care – PPO | Admitting: Speech Pathology

## 2019-08-13 ENCOUNTER — Encounter: Payer: Self-pay | Admitting: Speech Pathology

## 2019-08-13 ENCOUNTER — Encounter: Payer: Self-pay | Admitting: Occupational Therapy

## 2019-08-13 DIAGNOSIS — M25512 Pain in left shoulder: Secondary | ICD-10-CM | POA: Diagnosis present

## 2019-08-13 DIAGNOSIS — R41841 Cognitive communication deficit: Secondary | ICD-10-CM | POA: Insufficient documentation

## 2019-08-13 DIAGNOSIS — R2681 Unsteadiness on feet: Secondary | ICD-10-CM | POA: Diagnosis present

## 2019-08-13 DIAGNOSIS — M6281 Muscle weakness (generalized): Secondary | ICD-10-CM | POA: Diagnosis not present

## 2019-08-13 DIAGNOSIS — I69015 Cognitive social or emotional deficit following nontraumatic subarachnoid hemorrhage: Secondary | ICD-10-CM | POA: Diagnosis present

## 2019-08-13 DIAGNOSIS — M25612 Stiffness of left shoulder, not elsewhere classified: Secondary | ICD-10-CM | POA: Diagnosis present

## 2019-08-13 NOTE — Patient Instructions (Signed)
  Try repeating back what you have heard on the phone. "So what you need is....." Or "what I heard you say ........."  "Hang on a minute, could you repeat that a little slower" or "Hang on one moment" to give you a chance for your brain to keep up  Take 15 minute breaks in dark, quiet room so your brain can re-set and avoid cognitive fatigue  Limit background noise such as music, TV, let collegues know you can't participate in conversation while you are concentrating  [place] job advocating for yourself at work
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
## 2019-08-14 ENCOUNTER — Ambulatory Visit (INDEPENDENT_AMBULATORY_CARE_PROVIDER_SITE_OTHER): Payer: BC Managed Care – PPO | Admitting: Family Medicine

## 2019-08-14 ENCOUNTER — Encounter: Payer: Self-pay | Admitting: Family Medicine

## 2019-08-14 DIAGNOSIS — M25512 Pain in left shoulder: Secondary | ICD-10-CM

## 2019-08-14 DIAGNOSIS — M25612 Stiffness of left shoulder, not elsewhere classified: Secondary | ICD-10-CM

## 2019-08-14 DIAGNOSIS — F418 Other specified anxiety disorders: Secondary | ICD-10-CM | POA: Diagnosis not present

## 2019-08-14 DIAGNOSIS — R29898 Other symptoms and signs involving the musculoskeletal system: Secondary | ICD-10-CM

## 2019-08-14 DIAGNOSIS — G8929 Other chronic pain: Secondary | ICD-10-CM

## 2019-08-14 MED ORDER — CITALOPRAM HYDROBROMIDE 40 MG PO TABS: ORAL_TABLET | ORAL | 1 refills | Status: DC
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
## 2019-08-14 NOTE — Patient Instructions (Addendum)
COVID-19 Vaccine Information can be found at: [email or web address] For questions related to vaccine distribution or appointments, please email [email or web address] or call [phone].  Covid Vaccine appointment go to [email or web address]  We will call you to get you scheduled for MRI and they will call you to get you into orthopedics.

## 2019-08-20 ENCOUNTER — Ambulatory Visit: Payer: BC Managed Care – PPO | Admitting: Occupational Therapy

## 2019-08-20 ENCOUNTER — Encounter: Payer: Self-pay | Admitting: Speech Pathology

## 2019-08-20 ENCOUNTER — Other Ambulatory Visit: Payer: Self-pay

## 2019-08-20 ENCOUNTER — Ambulatory Visit: Payer: BC Managed Care – PPO | Admitting: Speech Pathology

## 2019-08-20 DIAGNOSIS — R41841 Cognitive communication deficit: Secondary | ICD-10-CM

## 2019-08-20 DIAGNOSIS — M6281 Muscle weakness (generalized): Secondary | ICD-10-CM | POA: Diagnosis not present

## 2019-08-20 NOTE — Patient Instructions (Signed)
Even when you feel good, you still need to manage your days with breaks and limited sensory input to try to keep on feeling good  When you push yourself, you will pay later in the day or later in the week  Please limit your sensory input - noise cancelling head phones, sunglasses, visor or  Cap, ear plugs  Cap and sunglasses in stores   Take breaks in quiet dark rooms to allow your brain to reset throughout work day and at home  Think of how many pennies you want to spend - do you want to spend them all in 1 day for the week   Set your priorities of top 3 tasks to do a day  See if you can do 1 thing at work rather than multitask to improve your accuracy at work. When you are interrupted, it is hard for your brain to shift back and forth from tasks and pick up where you left off  When you hit the wall, you are at a higher risk for falling, hitting your head or having an accident - this is why people who have had a brain injury are more likely to have another one.   Brain recovery coach on [employer]org  Consider asking your doctor about medications for attention
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
## 2019-08-26 ENCOUNTER — Other Ambulatory Visit: Payer: BC Managed Care – PPO

## 2019-08-27 ENCOUNTER — Encounter: Payer: BC Managed Care – PPO | Admitting: Occupational Therapy

## 2019-08-27 ENCOUNTER — Ambulatory Visit: Payer: BC Managed Care – PPO | Admitting: Speech Pathology

## 2019-08-27 ENCOUNTER — Other Ambulatory Visit: Payer: Self-pay

## 2019-08-27 ENCOUNTER — Encounter: Payer: Self-pay | Admitting: Speech Pathology

## 2019-08-27 ENCOUNTER — Ambulatory Visit: Payer: BC Managed Care – PPO | Admitting: Rehabilitation

## 2019-08-27 DIAGNOSIS — R41841 Cognitive communication deficit: Secondary | ICD-10-CM

## 2019-08-27 DIAGNOSIS — M6281 Muscle weakness (generalized): Secondary | ICD-10-CM | POA: Diagnosis not present

## 2019-08-27 NOTE — Patient Instructions (Signed)
   The longer you are in a meeting or call, the harder it may be to focus  Repeat back what you have heard to make sure you got all of the information  Get information in writing - take your time in a quiet room when you are rested to review any contract  Limit interruptions when you are meeting for your son's sport   To prevent fatigue days - take your breaks, limit sensory stimulation, limit how much you are doing in a day  Keep listening to your body and pacing yourself   When you get interrupted it is harder for your brain to transition your attention to the interruption and harder to get back on task.   If you need to concentrate on something important, let others know not to interrupt or have a conversation with you  Say the numbers aloud as you read them and enter them for parts  The day of your neuropsych eval, you will be tired after - it will be taxing to your brain
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
## 2019-09-03 ENCOUNTER — Ambulatory Visit: Payer: BC Managed Care – PPO | Admitting: Rehabilitation

## 2019-09-03 ENCOUNTER — Encounter: Payer: BC Managed Care – PPO | Admitting: Occupational Therapy

## 2019-09-03 ENCOUNTER — Ambulatory Visit: Payer: BC Managed Care – PPO | Admitting: Speech Pathology

## 2019-09-03 ENCOUNTER — Other Ambulatory Visit: Payer: Self-pay

## 2019-09-03 ENCOUNTER — Encounter: Payer: Self-pay | Admitting: Speech Pathology

## 2019-09-03 DIAGNOSIS — M6281 Muscle weakness (generalized): Secondary | ICD-10-CM | POA: Diagnosis not present

## 2019-09-03 DIAGNOSIS — R41841 Cognitive communication deficit: Secondary | ICD-10-CM

## 2019-09-03 NOTE — Patient Instructions (Addendum)
  Vocational Rehab: [phone]  Call and see if they can help you with modifications for job success   Now that you are at home, focus on doing a chore, then taking a 15 minutes in a quiet dark room to reset your brain
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
## 2019-09-10 ENCOUNTER — Encounter: Payer: BC Managed Care – PPO | Admitting: Occupational Therapy

## 2019-09-10 ENCOUNTER — Ambulatory Visit: Payer: BC Managed Care – PPO

## 2019-09-10 ENCOUNTER — Ambulatory Visit: Payer: BC Managed Care – PPO | Admitting: Rehabilitation

## 2019-09-17 ENCOUNTER — Other Ambulatory Visit: Payer: Self-pay

## 2019-09-17 ENCOUNTER — Ambulatory Visit: Payer: BC Managed Care – PPO | Attending: Neurology | Admitting: Speech Pathology

## 2019-09-17 ENCOUNTER — Ambulatory Visit: Payer: BC Managed Care – PPO | Admitting: Rehabilitation

## 2019-09-17 ENCOUNTER — Encounter: Payer: Self-pay | Admitting: Speech Pathology

## 2019-09-17 ENCOUNTER — Ambulatory Visit: Payer: BC Managed Care – PPO | Admitting: Occupational Therapy

## 2019-09-17 DIAGNOSIS — R41841 Cognitive communication deficit: Secondary | ICD-10-CM | POA: Diagnosis not present
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
## 2019-09-17 NOTE — Patient Instructions (Signed)
   This week: work on taking breaks in quiet dark room for 15 minutes after 30-45 minutes of work - even yard work  Take breaks in between errands in you car with eyes closed  Keep a box by the door for glasses, keys, etc  Recap what you have heard in important conversations - legal, disability, medical, motorcycle stuff  Use the list at the store - recheck the list with what is in the cart before checkout  Take a rest in a dark quiet room before you start a task such as yardwork or shopping  Get Bill's attention before telling him information  Reduce background noise when you are having important conversations and phone calls  Ball cap and sunglasses in stores  Get mom's car in the garage

## 2019-09-18 DIAGNOSIS — M19012 Primary osteoarthritis, left shoulder: Secondary | ICD-10-CM

## 2019-09-18 HISTORY — DX: Primary osteoarthritis, left shoulder: M19.012

## 2019-09-24 ENCOUNTER — Ambulatory Visit: Payer: BC Managed Care – PPO | Admitting: Speech Pathology

## 2019-09-24 ENCOUNTER — Ambulatory Visit: Payer: BC Managed Care – PPO | Admitting: Physical Therapy

## 2019-09-24 ENCOUNTER — Ambulatory Visit: Payer: BC Managed Care – PPO | Admitting: Occupational Therapy

## 2019-09-24 ENCOUNTER — Other Ambulatory Visit: Payer: Self-pay

## 2019-09-24 DIAGNOSIS — R41841 Cognitive communication deficit: Secondary | ICD-10-CM | POA: Diagnosis not present
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
## 2019-09-24 NOTE — Patient Instructions (Signed)
   Great job taking breaks  This week get more consistent with the breaks - use a timer or alarm to stop you from working and break  When you can't think of a word, describe it or like you are doing, use a synonym   Good job sitting in your car and letting your brain reset at the track

## 2019-09-27 NOTE — Pre-Procedure Instructions (Signed)
[facility] [address], [address] [place] [address] Phone: [phone] Fax: [phone]     Your procedure is scheduled on Tuesday, April 20, from 12:30 PM- 2:30 PM.  Report to [facility] Main Entrance "A" at 10:30 A.M., and check in at the Admitting office.  Call this number if you have problems the morning of surgery:  [phone]  Call [phone] if you have any questions prior to your surgery date Monday-Friday 8am-4pm.    Remember:  Do not eat after midnight the night before your surgery.  You may drink clear liquids until 09:30 AM the morning of your surgery.    Clear liquids allowed are: Water, Non-Citrus Juices (without pulp), Carbonated Beverages, Clear Tea, Black Coffee Only, and Gatorade.    Enhanced Recovery after Surgery Enhanced Recovery after Surgery is a protocol used to improve the stress on your body and your recovery after surgery.   . The day of surgery:  o Drink ONE (1) Pre-Surgery Clear Ensure by 09:30 AM.   o This drink was given to you during your hospital  pre-op appointment visit. o Nothing else to drink after completing the  Pre-Surgery Clear Ensure.    Take these medicines the morning of surgery with A SIP OF WATER : IF NEEDED: acetaminophen (TYLENOL) butalbital-acetaminophen-caffeine (FIORICET)  albuterol (PROVENTIL HFA;VENTOLIN HFA) inhaler. *Please bring all inhalers with you the day of surgery.    As of today, STOP taking any Aspirin (unless otherwise instructed by your surgeon) and Aspirin containing products, NSAIDs e.g. Aleve, Naproxen, Ibuprofen, Motrin, Advil, Goody's, BC's, all herbal medications, fish oil, and all vitamins.                      Do not wear jewelry.            Do not wear lotions, powders, colognes, or deodorant.            Men may shave face and neck.            Do not bring valuables to the hospital.            [facility] is not responsible for any belongings or  valuables.  Do NOT Smoke (Tobacco/Vapping) or drink Alcohol 24 hours prior to your procedure.  If you use a CPAP at night, you may bring all equipment for your overnight stay.   Contacts, glasses, dentures or bridgework may not be worn into surgery.      For patients admitted to the hospital, discharge time will be determined by your treatment team.   Patients discharged the day of surgery will not be allowed to drive home, and someone needs to stay with them for 24 hours.    Special instructions:   [facility]- Preparing For Surgery  Before surgery, you can play an important role. Because skin is not sterile, your skin needs to be as free of germs as possible. You can reduce the number of germs on your skin by washing with CHG (chlorahexidine gluconate) Soap before surgery.  CHG is an antiseptic cleaner which kills germs and bonds with the skin to continue killing germs even after washing.    Oral Hygiene is also important to reduce your risk of infection.  Remember - BRUSH YOUR TEETH THE MORNING OF SURGERY WITH YOUR REGULAR TOOTHPASTE  Please do not use if you have an allergy to CHG or antibacterial soaps. If your skin becomes reddened/irritated stop  using the CHG.  Do not shave (including legs and underarms) for at least 48 hours prior to first CHG shower. It is OK to shave your face.  Please follow these instructions carefully.   1. Shower the NIGHT BEFORE SURGERY and the MORNING OF SURGERY with CHG Soap.   2. If you chose to wash your hair, wash your hair first as usual with your normal shampoo.  3. After you shampoo, rinse your hair and body thoroughly to remove the shampoo.  4. Use CHG as you would any other liquid soap. You can apply CHG directly to the skin and wash gently with a scrungie or a clean washcloth.   5. Apply the CHG Soap to your body ONLY FROM THE NECK DOWN.  Do not use on open wounds or open sores. Avoid contact with your eyes, ears, mouth and genitals  (private parts). Wash Face and genitals (private parts)  with your normal soap.   6. Wash thoroughly, paying special attention to the area where your surgery will be performed.  7. Thoroughly rinse your body with warm water from the neck down.  8. DO NOT shower/wash with your normal soap after using and rinsing off the CHG Soap.  9. Pat yourself dry with a CLEAN TOWEL.  10. Wear CLEAN PAJAMAS to bed the night before surgery, wear comfortable clothes the morning of surgery  11. Place CLEAN SHEETS on your bed the night of your first shower and DO NOT SLEEP WITH PETS.   Day of Surgery:   Do not apply any deodorants/lotions.  Please wear clean clothes to the hospital/surgery center.   Remember to brush your teeth WITH YOUR REGULAR TOOTHPASTE.   Please read over the following fact sheets that you were given.

## 2019-09-28 ENCOUNTER — Other Ambulatory Visit: Payer: Self-pay

## 2019-09-28 ENCOUNTER — Encounter (HOSPITAL_COMMUNITY): Payer: Self-pay

## 2019-09-28 ENCOUNTER — Encounter (HOSPITAL_COMMUNITY)
Admission: RE | Admit: 2019-09-28 | Discharge: 2019-09-28 | Disposition: A | Discharge status: Home / Self Care | Payer: BC Managed Care – PPO | Source: Ambulatory Visit | Attending: Orthopedic Surgery | Admitting: Orthopedic Surgery

## 2019-09-28 ENCOUNTER — Other Ambulatory Visit (HOSPITAL_COMMUNITY)
Admission: RE | Admit: 2019-09-28 | Discharge: 2019-09-28 | Disposition: A | Discharge status: Home / Self Care | Payer: BC Managed Care – PPO | Source: Ambulatory Visit | Attending: Orthopedic Surgery | Admitting: Orthopedic Surgery

## 2019-09-28 DIAGNOSIS — Z01812 Encounter for preprocedural laboratory examination: Secondary | ICD-10-CM | POA: Diagnosis not present

## 2019-09-28 DIAGNOSIS — Z79899 Other long term (current) drug therapy: Secondary | ICD-10-CM | POA: Insufficient documentation

## 2019-09-28 DIAGNOSIS — M19012 Primary osteoarthritis, left shoulder: Secondary | ICD-10-CM | POA: Diagnosis not present

## 2019-09-28 DIAGNOSIS — Z87891 Personal history of nicotine dependence: Secondary | ICD-10-CM | POA: Insufficient documentation

## 2019-09-28 DIAGNOSIS — F329 Major depressive disorder, single episode, unspecified: Secondary | ICD-10-CM | POA: Diagnosis not present

## 2019-09-28 DIAGNOSIS — Z6841 Body Mass Index (BMI) 40.0 and over, adult: Secondary | ICD-10-CM | POA: Insufficient documentation

## 2019-09-28 DIAGNOSIS — M7542 Impingement syndrome of left shoulder: Secondary | ICD-10-CM | POA: Insufficient documentation

## 2019-09-28 DIAGNOSIS — Z20822 Contact with and (suspected) exposure to covid-19: Secondary | ICD-10-CM | POA: Diagnosis not present

## 2019-09-28 DIAGNOSIS — F909 Attention-deficit hyperactivity disorder, unspecified type: Secondary | ICD-10-CM | POA: Diagnosis not present

## 2019-09-28 DIAGNOSIS — E785 Hyperlipidemia, unspecified: Secondary | ICD-10-CM | POA: Diagnosis not present

## 2019-09-28 DIAGNOSIS — Z87820 Personal history of traumatic brain injury: Secondary | ICD-10-CM | POA: Insufficient documentation

## 2019-09-28 DIAGNOSIS — G4733 Obstructive sleep apnea (adult) (pediatric): Secondary | ICD-10-CM | POA: Diagnosis not present

## 2019-09-28 HISTORY — DX: Headache, unspecified: R51.9

## 2019-09-28 LAB — CBC
HCT: 43.8 % (ref 39.0–52.0)
Hemoglobin: 13.7 g/dL (ref 13.0–17.0)
MCH: 27.4 pg (ref 26.0–34.0)
MCHC: 31.3 g/dL (ref 30.0–36.0)
MCV: 87.6 fL (ref 80.0–100.0)
Platelets: 336 10*3/uL (ref 150–400)
RBC: 5 MIL/uL (ref 4.22–5.81)
RDW: 14.6 % (ref 11.5–15.5)
WBC: 6.7 10*3/uL (ref 4.0–10.5)
nRBC: 0 % (ref 0.0–0.2)

## 2019-09-28 LAB — SARS CORONAVIRUS 2 (TAT 6-24 HRS): SARS Coronavirus 2: NEGATIVE

## 2019-09-28 LAB — BASIC METABOLIC PANEL
Anion gap: 10 (ref 5–15)
BUN: 14 mg/dL (ref 6–20)
CO2: 24 mmol/L (ref 22–32)
Calcium: 9.3 mg/dL (ref 8.9–10.3)
Chloride: 103 mmol/L (ref 98–111)
Creatinine, Ser: 1 mg/dL (ref 0.61–1.24)
GFR calc Af Amer: >60 mL/min (ref >60)
GFR calc non Af Amer: >60 mL/min (ref >60)
Glucose, Bld: 103 mg/dL — ABNORMAL HIGH (ref 70–99)
Potassium: 4.5 mmol/L (ref 3.5–5.1)
Sodium: 137 mmol/L (ref 135–145)

## 2019-09-28 NOTE — Progress Notes (Signed)
Patient denies shortness of breath, fever, cough and chest pain.  PCP - Felix Pacini, DO Cardiologist -  n/a Neurology - Shon Millet, DO Pulmonology - n/a  Chest x-ray - 10/16/18 (1 View) EKG - 10/15/18 Stress Test - 08/11/10 ECHO - 08/11/10 Cardiac Cath - n/a  Sleep Study - Yes CPAP - Uses CPAP nightly  ERAS: Clears til 0930 DOS, Pre-surgery drink given at PAT appt.  Anesthesia review: Yes   Coronavirus Screening Covid test scheduled 09/28/19 at 11:25 am. Have you experienced the following symptoms:  Cough yes/no: No Fever (>100.50F)  yes/no: No Runny nose yes/no: No Sore throat yes/no: No Difficulty breathing/shortness of breath  yes/no: No  Have you traveled in the last 14 days and where? yes/no: No  Patient verbalized understanding of instructions that were given to them at the PAT appointment.

## 2019-10-01 ENCOUNTER — Ambulatory Visit: Payer: BC Managed Care – PPO | Admitting: Speech Pathology

## 2019-10-01 ENCOUNTER — Ambulatory Visit: Payer: BC Managed Care – PPO | Admitting: Physical Therapy

## 2019-10-01 ENCOUNTER — Encounter: Payer: BC Managed Care – PPO | Admitting: Occupational Therapy

## 2019-10-01 MED ORDER — DEXTROSE 5 % IV SOLN
3.0000 g | INTRAVENOUS | Status: AC
  Administered 2019-10-02: 13:00:00 3 g via INTRAVENOUS
  Filled 2019-10-01: qty 3000
  Filled 2019-10-01: qty 3
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
## 2019-10-02 ENCOUNTER — Ambulatory Visit (HOSPITAL_COMMUNITY): Payer: BC Managed Care – PPO | Admitting: Certified Registered"

## 2019-10-02 ENCOUNTER — Ambulatory Visit (HOSPITAL_COMMUNITY): Payer: BC Managed Care – PPO | Admitting: Vascular Surgery

## 2019-10-02 ENCOUNTER — Other Ambulatory Visit: Payer: Self-pay

## 2019-10-02 ENCOUNTER — Encounter (HOSPITAL_COMMUNITY)
Admission: RE | Disposition: A | Discharge status: Home / Self Care | Payer: Self-pay | Source: Home / Self Care | Attending: Orthopedic Surgery

## 2019-10-02 ENCOUNTER — Encounter (HOSPITAL_COMMUNITY): Payer: Self-pay | Admitting: Orthopedic Surgery

## 2019-10-02 ENCOUNTER — Ambulatory Visit (HOSPITAL_COMMUNITY)
Admission: RE | Admit: 2019-10-02 | Discharge: 2019-10-02 | Disposition: A | Discharge status: Home / Self Care | Payer: BC Managed Care – PPO | Attending: Orthopedic Surgery | Admitting: Orthopedic Surgery

## 2019-10-02 DIAGNOSIS — S43432A Superior glenoid labrum lesion of left shoulder, initial encounter: Secondary | ICD-10-CM | POA: Diagnosis not present

## 2019-10-02 DIAGNOSIS — Z6841 Body Mass Index (BMI) 40.0 and over, adult: Secondary | ICD-10-CM | POA: Insufficient documentation

## 2019-10-02 DIAGNOSIS — M7502 Adhesive capsulitis of left shoulder: Secondary | ICD-10-CM | POA: Insufficient documentation

## 2019-10-02 DIAGNOSIS — M19012 Primary osteoarthritis, left shoulder: Secondary | ICD-10-CM | POA: Insufficient documentation

## 2019-10-02 DIAGNOSIS — Z87891 Personal history of nicotine dependence: Secondary | ICD-10-CM | POA: Insufficient documentation

## 2019-10-02 DIAGNOSIS — I10 Essential (primary) hypertension: Secondary | ICD-10-CM | POA: Diagnosis not present

## 2019-10-02 DIAGNOSIS — F329 Major depressive disorder, single episode, unspecified: Secondary | ICD-10-CM | POA: Diagnosis not present

## 2019-10-02 DIAGNOSIS — Z79899 Other long term (current) drug therapy: Secondary | ICD-10-CM | POA: Diagnosis not present

## 2019-10-02 DIAGNOSIS — M25812 Other specified joint disorders, left shoulder: Secondary | ICD-10-CM | POA: Insufficient documentation

## 2019-10-02 DIAGNOSIS — G4733 Obstructive sleep apnea (adult) (pediatric): Secondary | ICD-10-CM | POA: Diagnosis not present

## 2019-10-02 DIAGNOSIS — Z888 Allergy status to other drugs, medicaments and biological substances status: Secondary | ICD-10-CM | POA: Diagnosis not present

## 2019-10-02 DIAGNOSIS — F988 Other specified behavioral and emotional disorders with onset usually occurring in childhood and adolescence: Secondary | ICD-10-CM | POA: Insufficient documentation

## 2019-10-02 DIAGNOSIS — X58XXXA Exposure to other specified factors, initial encounter: Secondary | ICD-10-CM | POA: Diagnosis not present

## 2019-10-02 DIAGNOSIS — Z87820 Personal history of traumatic brain injury: Secondary | ICD-10-CM | POA: Diagnosis not present

## 2019-10-02 HISTORY — PX: SHOULDER ARTHROSCOPY: SHX128

## 2019-10-02 HISTORY — PX: 25 GAUGE PARS PLANA VITRECTOMY WITH 20 GAUGE MVR PORT: SHX6041

## 2019-10-02 SURGERY — 25 GAUGE PARS PLANA VITRECTOMY WITH 20 GAUGE MVR PORT
Anesthesia: General | Site: Shoulder | Laterality: Left

## 2019-10-02 MED ORDER — OXYCODONE HCL 5 MG/5ML PO SOLN: 5.0000 mg | Freq: Once | ORAL | Status: AC | PRN

## 2019-10-02 MED ORDER — FENTANYL CITRATE (PF) 250 MCG/5ML IJ SOLN
INTRAMUSCULAR | Status: DC | PRN
  Administered 2019-10-02: 100 ug via INTRAVENOUS

## 2019-10-02 MED ORDER — LIDOCAINE 2% (20 MG/ML) 5 ML SYRINGE
INTRAMUSCULAR | Status: DC | PRN
  Administered 2019-10-02: 80 mg via INTRAVENOUS

## 2019-10-02 MED ORDER — LACTATED RINGERS IV SOLN: INTRAVENOUS | Status: DC

## 2019-10-02 MED ORDER — ONDANSETRON HCL 4 MG/2ML IJ SOLN
INTRAMUSCULAR | Status: AC
  Filled 2019-10-02: qty 2

## 2019-10-02 MED ORDER — LIDOCAINE 2% (20 MG/ML) 5 ML SYRINGE
INTRAMUSCULAR | Status: AC
  Filled 2019-10-02: qty 5

## 2019-10-02 MED ORDER — PROPOFOL 10 MG/ML IV BOLUS
INTRAVENOUS | Status: DC | PRN
  Administered 2019-10-02: 300 mg via INTRAVENOUS

## 2019-10-02 MED ORDER — SUCCINYLCHOLINE CHLORIDE 200 MG/10ML IV SOSY
PREFILLED_SYRINGE | INTRAVENOUS | Status: DC | PRN
  Administered 2019-10-02: 160 mg via INTRAVENOUS

## 2019-10-02 MED ORDER — SUCCINYLCHOLINE CHLORIDE 200 MG/10ML IV SOSY
PREFILLED_SYRINGE | INTRAVENOUS | Status: AC
  Filled 2019-10-02: qty 10

## 2019-10-02 MED ORDER — ROCURONIUM BROMIDE 10 MG/ML (PF) SYRINGE
PREFILLED_SYRINGE | INTRAVENOUS | Status: AC
  Filled 2019-10-02: qty 10

## 2019-10-02 MED ORDER — DEXAMETHASONE SODIUM PHOSPHATE 10 MG/ML IJ SOLN
INTRAMUSCULAR | Status: AC
  Filled 2019-10-02: qty 1

## 2019-10-02 MED ORDER — ONDANSETRON 4 MG PO TBDP: 4.0000 mg | ORAL_TABLET | Freq: Three times a day (TID) | ORAL | 0 refills | Status: DC | PRN

## 2019-10-02 MED ORDER — FENTANYL CITRATE (PF) 250 MCG/5ML IJ SOLN
INTRAMUSCULAR | Status: AC
  Filled 2019-10-02: qty 5

## 2019-10-02 MED ORDER — BUPIVACAINE-EPINEPHRINE (PF) 0.25% -1:200000 IJ SOLN
INTRAMUSCULAR | Status: DC | PRN
  Administered 2019-10-02: 25 mL via PERINEURAL

## 2019-10-02 MED ORDER — CLONIDINE HCL (ANALGESIA) 100 MCG/ML EP SOLN
EPIDURAL | Status: DC | PRN
  Administered 2019-10-02: 50 ug

## 2019-10-02 MED ORDER — PHENYLEPHRINE HCL (PRESSORS) 10 MG/ML IV SOLN
INTRAVENOUS | Status: AC
  Filled 2019-10-02: qty 1

## 2019-10-02 MED ORDER — TRANEXAMIC ACID-NACL 1000-0.7 MG/100ML-% IV SOLN
1000.0000 mg | INTRAVENOUS | Status: AC
  Administered 2019-10-02: 1000 mg via INTRAVENOUS
  Filled 2019-10-02: qty 100

## 2019-10-02 MED ORDER — MIDAZOLAM HCL 2 MG/2ML IJ SOLN
INTRAMUSCULAR | Status: AC
  Administered 2019-10-02: 1 mg via INTRAVENOUS
  Filled 2019-10-02: qty 2

## 2019-10-02 MED ORDER — PROPOFOL 10 MG/ML IV BOLUS
INTRAVENOUS | Status: AC
  Filled 2019-10-02: qty 20

## 2019-10-02 MED ORDER — EPHEDRINE SULFATE-NACL 50-0.9 MG/10ML-% IV SOSY
PREFILLED_SYRINGE | INTRAVENOUS | Status: DC | PRN
  Administered 2019-10-02: 5 mg via INTRAVENOUS

## 2019-10-02 MED ORDER — LACTATED RINGERS IV SOLN: INTRAVENOUS | Status: DC | PRN

## 2019-10-02 MED ORDER — ONDANSETRON HCL 4 MG/2ML IJ SOLN
INTRAMUSCULAR | Status: DC | PRN
  Administered 2019-10-02: 4 mg via INTRAVENOUS

## 2019-10-02 MED ORDER — PHENYLEPHRINE HCL-NACL 10-0.9 MG/250ML-% IV SOLN
INTRAVENOUS | Status: DC | PRN
  Administered 2019-10-02: 35 ug/min via INTRAVENOUS

## 2019-10-02 MED ORDER — MIDAZOLAM HCL 2 MG/2ML IJ SOLN: 2.0000 mg | Freq: Once | INTRAMUSCULAR | Status: DC

## 2019-10-02 MED ORDER — PROMETHAZINE HCL 25 MG/ML IJ SOLN: 6.2500 mg | INTRAMUSCULAR | Status: DC | PRN

## 2019-10-02 MED ORDER — PHENYLEPHRINE 40 MCG/ML (10ML) SYRINGE FOR IV PUSH (FOR BLOOD PRESSURE SUPPORT)
PREFILLED_SYRINGE | INTRAVENOUS | Status: AC
  Filled 2019-10-02: qty 10

## 2019-10-02 MED ORDER — ACETAMINOPHEN 500 MG PO TABS: 1000.0000 mg | ORAL_TABLET | Freq: Once | ORAL | Status: DC

## 2019-10-02 MED ORDER — MIDAZOLAM HCL 2 MG/2ML IJ SOLN
INTRAMUSCULAR | Status: DC | PRN
  Administered 2019-10-02: 2 mg via INTRAVENOUS

## 2019-10-02 MED ORDER — MIDAZOLAM HCL 2 MG/2ML IJ SOLN: 1.0000 mg | Freq: Once | INTRAMUSCULAR | Status: AC

## 2019-10-02 MED ORDER — PHENYLEPHRINE 40 MCG/ML (10ML) SYRINGE FOR IV PUSH (FOR BLOOD PRESSURE SUPPORT)
PREFILLED_SYRINGE | INTRAVENOUS | Status: DC | PRN
  Administered 2019-10-02: 80 ug via INTRAVENOUS
  Administered 2019-10-02 (×2): 120 ug via INTRAVENOUS
  Administered 2019-10-02: 80 ug via INTRAVENOUS

## 2019-10-02 MED ORDER — SODIUM CHLORIDE 0.9 % IR SOLN
Status: DC | PRN
  Administered 2019-10-02 (×2): 3000 mL

## 2019-10-02 MED ORDER — OXYCODONE HCL 5 MG PO TABS
ORAL_TABLET | ORAL | Status: AC
  Filled 2019-10-02: qty 1

## 2019-10-02 MED ORDER — DEXAMETHASONE SODIUM PHOSPHATE 10 MG/ML IJ SOLN
INTRAMUSCULAR | Status: DC | PRN
  Administered 2019-10-02: 5 mg via INTRAVENOUS

## 2019-10-02 MED ORDER — OXYCODONE HCL 5 MG PO TABS
5.0000 mg | ORAL_TABLET | Freq: Once | ORAL | Status: AC | PRN
  Administered 2019-10-02: 5 mg via ORAL

## 2019-10-02 MED ORDER — FENTANYL CITRATE (PF) 100 MCG/2ML IJ SOLN
50.0000 ug | Freq: Once | INTRAMUSCULAR | Status: AC
  Filled 2019-10-02: qty 1

## 2019-10-02 MED ORDER — FENTANYL CITRATE (PF) 100 MCG/2ML IJ SOLN: 25.0000 ug | INTRAMUSCULAR | Status: DC | PRN

## 2019-10-02 MED ORDER — OXYCODONE HCL 5 MG PO TABS: 5.0000 mg | ORAL_TABLET | Freq: Four times a day (QID) | ORAL | 0 refills | Status: DC | PRN

## 2019-10-02 MED ORDER — FENTANYL CITRATE (PF) 100 MCG/2ML IJ SOLN
INTRAMUSCULAR | Status: AC
  Administered 2019-10-02: 50 ug via INTRAVENOUS
  Filled 2019-10-02: qty 2

## 2019-10-02 MED ORDER — MIDAZOLAM HCL 2 MG/2ML IJ SOLN
INTRAMUSCULAR | Status: AC
  Filled 2019-10-02: qty 2

## 2019-10-02 MED ORDER — SUGAMMADEX SODIUM 200 MG/2ML IV SOLN
INTRAVENOUS | Status: DC | PRN
  Administered 2019-10-02: 393.8 mg via INTRAVENOUS

## 2019-10-02 MED ORDER — ROCURONIUM BROMIDE 10 MG/ML (PF) SYRINGE
PREFILLED_SYRINGE | INTRAVENOUS | Status: DC | PRN
  Administered 2019-10-02: 40 mg via INTRAVENOUS

## 2019-10-02 SURGICAL SUPPLY — 38 items
BLADE EXCALIBUR 4.0X13 (MISCELLANEOUS) ×2 IMPLANT
BLADE SURG 11 STRL SS (BLADE) ×3 IMPLANT
BURR OVAL 8 FLU 4.0X13 (MISCELLANEOUS) ×2 IMPLANT
CANNULA TWIST IN 8.25X7CM (CANNULA) ×3 IMPLANT
DRAPE INCISE IOBAN 66X45 STRL (DRAPES) IMPLANT
DRAPE STERI 35X30 U-POUCH (DRAPES) ×3 IMPLANT
DRAPE U-SHAPE 47X51 STRL (DRAPES) ×3 IMPLANT
DRSG ADAPTIC 3X8 NADH LF (GAUZE/BANDAGES/DRESSINGS) ×2 IMPLANT
DRSG PAD ABDOMINAL 8X10 ST (GAUZE/BANDAGES/DRESSINGS) ×7 IMPLANT
DURAPREP 26ML APPLICATOR (WOUND CARE) ×3 IMPLANT
GAUZE SPONGE 4X4 12PLY STRL (GAUZE/BANDAGES/DRESSINGS) ×3 IMPLANT
GLOVE BIO SURGEON STRL SZ7.5 (GLOVE) ×3 IMPLANT
GLOVE BIOGEL PI IND STRL 8 (GLOVE) ×2 IMPLANT
GLOVE BIOGEL PI INDICATOR 8 (GLOVE) ×1
GOWN STRL REUS W/ TWL LRG LVL3 (GOWN DISPOSABLE) ×4 IMPLANT
GOWN STRL REUS W/TWL LRG LVL3 (GOWN DISPOSABLE) ×6
KIT BASIN OR (CUSTOM PROCEDURE TRAY) ×3 IMPLANT
KIT TURNOVER KIT B (KITS) ×3 IMPLANT
MANIFOLD NEPTUNE II (INSTRUMENTS) ×3 IMPLANT
NDL SCORPION MULTI FIRE (NEEDLE) IMPLANT
NDL SPNL 18GX3.5 QUINCKE PK (NEEDLE) ×1 IMPLANT
NEEDLE SCORPION MULTI FIRE (NEEDLE) IMPLANT
NEEDLE SPNL 18GX3.5 QUINCKE PK (NEEDLE) ×3 IMPLANT
NS IRRIG 1000ML POUR BTL (IV SOLUTION) ×3 IMPLANT
PACK SHOULDER (CUSTOM PROCEDURE TRAY) ×3 IMPLANT
PORT APPOLLO RF 90DEGREE MULTI (SURGICAL WAND) ×3 IMPLANT
PROBE BIPOLAR ATHRO 135MM 90D (MISCELLANEOUS) IMPLANT
SLEEVE ARM SUSPENSION SYSTEM (MISCELLANEOUS) ×3 IMPLANT
SLING ARM FOAM STRAP XLG (SOFTGOODS) ×2 IMPLANT
SPONGE LAP 4X18 RFD (DISPOSABLE) ×3 IMPLANT
SUT ETHILON 3 0 PS 1 (SUTURE) ×3 IMPLANT
SUT TIGER TAPE 7 IN WHITE (SUTURE) IMPLANT
TAPE FIBER 2MM 7IN #2 BLUE (SUTURE) IMPLANT
TAPE PAPER 3X10 WHT MICROPORE (GAUZE/BANDAGES/DRESSINGS) ×3 IMPLANT
TOWEL GREEN STERILE (TOWEL DISPOSABLE) ×3 IMPLANT
TOWEL GREEN STERILE FF (TOWEL DISPOSABLE) ×3 IMPLANT
TUBING ARTHROSCOPY IRRIG 16FT (MISCELLANEOUS) ×3 IMPLANT
WATER STERILE IRR 1000ML POUR (IV SOLUTION) ×3 IMPLANT

## 2019-10-02 NOTE — Brief Op Note (Signed)
10/02/2019  1:53 PM  PATIENT:  Bobby [name]  44 y.o. male  PRE-OPERATIVE DIAGNOSIS:  Left shoulder impingement, acromioclavicular osteoarthritis  POST-OPERATIVE DIAGNOSIS:  Left shoulder impingement, acromioclavicular osteoarthritis  PROCEDURE:  Procedure(s): Shoulder Arthroscopy With Subacromial Decompression. Extensive Debridement (Left)  SURGEON:  Surgeon(s) and Role:    * Yolonda Kida, MD - Primary  PHYSICIAN ASSISTANT:   ASSISTANTS: Lenn Cal, RNFA   ANESTHESIA:   regional and general  EBL:  50 mL   BLOOD ADMINISTERED:none  DRAINS: none   LOCAL MEDICATIONS USED:  NONE  SPECIMEN:  No Specimen  DISPOSITION OF SPECIMEN:  N/A  COUNTS:  YES  TOURNIQUET:  * No tourniquets in log *  DICTATION: .Note written in EPIC  PLAN OF CARE: Discharge to home after PACU  PATIENT DISPOSITION:  PACU - hemodynamically stable.   Delay start of Pharmacological VTE agent (>24hrs) due to surgical blood loss or risk of bleeding: not applicable

## 2019-10-02 NOTE — Anesthesia Postprocedure Evaluation (Signed)
Anesthesia Post Note  Patient: Bobby [name]  Procedure(s) Performed: Shoulder Arthroscopy With Subacromial Decompression. Extensive Debridement (Left Shoulder)     Anesthesia Post Evaluation  Last Vitals:  Vitals:   10/02/19 1210 10/02/19 1215  BP: (!) 100/47 (!) 99/46  Pulse: 65 63  Resp: 15 16  Temp:    SpO2: 100% 99%    Last Pain:  Vitals:   10/02/19 1150  TempSrc:   PainSc: 3                  [place] Morrow
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
## 2019-10-02 NOTE — Discharge Instructions (Signed)
- -  Maintain left arm in sling until your nerve block resolves.  At that point you may remove the sling and use the arm as tolerated.  -Apply ice to the left shoulder for 30 minutes out of each hour that you are awake.  -Maintain postoperative bandages for 3 days.  You may remove these on the third day and begin showering at that time.  You should cover your arthroscopic portal sites with Band-Aids to your follow-up appointment.  -For mild to moderate pain use Tylenol and Advil as needed.  For breakthrough pain use oxycodone as necessary.  -Return to see Dr. Aundria Rud in 2 weeks for routine postoperative wound check and suture removal.

## 2019-10-02 NOTE — Anesthesia Procedure Notes (Signed)
Procedure Name: Intubation Date/Time: 10/02/2019 1:15 PM Performed by: Modena Morrow, CRNA Pre-anesthesia Checklist: Patient identified, Emergency Drugs available, Suction available and Patient being monitored Patient Re-evaluated:Patient Re-evaluated prior to induction Oxygen Delivery Method: Circle system utilized Preoxygenation: Pre-oxygenation with 100% oxygen Induction Type: IV induction Ventilation: Mask ventilation without difficulty and Oral airway inserted - appropriate to patient size Laryngoscope Size: Glidescope and 4 Grade View: Grade I Tube type: Oral Tube size: 7.5 mm Number of attempts: 1 Airway Equipment and Method: Stylet and Oral airway Placement Confirmation: ETT inserted through vocal cords under direct vision,  positive ETCO2 and breath sounds checked- equal and bilateral Secured at: 23 cm Tube secured with: Tape Dental Injury: Teeth and Oropharynx as per pre-operative assessment

## 2019-10-02 NOTE — Anesthesia Procedure Notes (Signed)
Anesthesia Regional Block: Interscalene brachial plexus block   Pre-Anesthetic Checklist: ,, timeout performed, Correct Patient, Correct Site, Correct Laterality, Correct Procedure, Correct Position, site marked, Risks and benefits discussed,  Surgical consent,  Pre-op evaluation,  At surgeon's request and post-op pain management  Laterality: Left  Prep: chloraprep       Needles:  Injection technique: Single-shot  Needle Type: Echogenic Stimulator Needle     Needle Length: 9cm  Needle Gauge: 21     Additional Needles:   Procedures:, nerve stimulator,,, ultrasound used (permanent image in chart),,,,   Nerve Stimulator or Paresthesia:  Response: deltoid and biceps, 0.5 mA,   Additional Responses:   Narrative:  Start time: 10/02/2019 11:55 AM End time: 10/02/2019 12:01 PM Injection made incrementally with aspirations every 5 mL.  Performed by: Personally  Anesthesiologist: Marcene Duos, MD

## 2019-10-02 NOTE — Transfer of Care (Signed)
Immediate Anesthesia Transfer of Care Note  Patient: Bobby [name]  Procedure(s) Performed: Shoulder Arthroscopy With Subacromial Decompression. Extensive Debridement (Left Shoulder)  Patient Location: PACU  Anesthesia Type:General  Level of Consciousness: drowsy and patient cooperative  Airway & Oxygen Therapy: Patient Spontanous Breathing and Patient connected to face mask oxygen  Post-op Assessment: Report given to RN and Post -op Vital signs reviewed and stable  Post vital signs: Reviewed and stable  Last Vitals:  Vitals Value Taken Time  BP 150/86 10/02/19 1412  Temp    Pulse 80 10/02/19 1412  Resp 22 10/02/19 1412  SpO2 100 % 10/02/19 1412  Vitals shown include unvalidated device data.  Last Pain:  Vitals:   10/02/19 1150  TempSrc:   PainSc: 3       Patients Stated Pain Goal: 5 (10/02/19 1109)  Complications: No apparent anesthesia complications

## 2019-10-02 NOTE — Anesthesia Postprocedure Evaluation (Signed)
Anesthesia Post Note  Patient: Bobby [name]  Procedure(s) Performed: Shoulder Arthroscopy With Subacromial Decompression. Extensive Debridement (Left Shoulder)     Patient location during evaluation: PACU Anesthesia Type: General Level of consciousness: awake Pain management: pain level controlled Vital Signs Assessment: post-procedure vital signs reviewed and stable Respiratory status: spontaneous breathing Cardiovascular status: stable Postop Assessment: no apparent nausea or vomiting Anesthetic complications: no    Last Vitals:  Vitals:   10/02/19 1445 10/02/19 1458  BP: 125/79 120/70  Pulse: 77 77  Resp: 14 (!) 22  Temp:    SpO2: 96% 96%    Last Pain:  Vitals:   10/02/19 1445  TempSrc:   PainSc: 3                  Joyce Heitman

## 2019-10-02 NOTE — Op Note (Signed)
10/02/2019   PATIENT:  Bobby [name]    PRE-OPERATIVE DIAGNOSIS:  1.  Left shoulder impingement 2.  Left shoulder acromioclavicular arthritis 3.  Left shoulder adhesive capsulitis 4.  Left shoulder degenerative labral tearing (anterior, superior and posterior)  POST-OPERATIVE DIAGNOSIS:  Same  PROCEDURE:   1.. Left shoulder arthroscopic extensive debridement including rotator interval, anterior labrum, superior labrum, posterior labrum, and subacromial bursa. 2.  Left shoulder subacromial decompression without coracoacromial ligament release. 3.  Biceps tenotomy, arthroscopic.  SURGEON:  Nicholes Stairs, MD   ASSISTANT: Katy Apo, RNFA  ANESTHESIA:   General  ESTIMATED BLOOD LOSS: 50 cc  PREOPERATIVE INDICATIONS:  Bobby [name] is a  44 y.o. male with a diagnosis of Left shoulder impingement, acromioclavicular osteoarthritis who failed conservative measures and elected for surgical management.    The risks benefits and alternatives were discussed with the patient preoperatively including but not limited to the risks of infection, bleeding, nerve injury, cardiopulmonary complications, the need for revision surgery, among others, and the patient was willing to proceed.  OPERATIVE IMPLANTS:  None  OPERATIVE FINDINGS: There was degenerative labral tearing noted on anterior superior and posterior aspect.  This was associated with some biceps instability as well.  The rotator cuff musculature was intact x4.  There was abundant subacromial bursitis as well as type II acromion and a large undersurface spur on the distal clavicle and medial acromion.  This was creating a mass-effect on the supraspinatus.  UNIQUE ASPECTS OF THE CASE:    OPERATIVE PROCEDURE: The patient was brought to the operating room and placed in the supine position. General anesthesia was administered. IV antibiotics were given. General anesthesia was administered.  Following administration of general  anesthesia we placed the patient in the right lateral decubitus position with the left upper extremity in a well-padded inline traction device.  We then prepped and draped in standard sterile fashion.  We began the procedure by establishing a standard posterior lateral portal just 2 cm inferior and 1 cm medial to the posterior lateral corner of the acromion.  Diagnostic arthroscopy was undertaken with the above operative findings.  Also of note there was grade 0 chondromalacia of the humeral head and glenoid.  There was abundant synovitis noted as well.  Then, via spinal needle localization we established a mid glenoid, anterior working portal.  This was dilated and a cannula was placed.  We then began our extensive debridement  Extensive debridement was carried out with the motorized shaver in the intra-articular portion of the joint debriding the anterior, superior, and posterior labral tissue.  Unstable flap tears were treated back to stable tissue.  Furthermore, due to the unstable superior labral tear we elected to tenotomized the long head of the biceps tendon.  The biceps stump was then debrided.  Lastly while in the intra-articular portion we did debride the middle glenohumeral ligament as well as anterior capsular tissue with radiofrequency wand.  This will help facilitate external rotation postoperatively.  Then, in the subacromial space extensive debridement included the wide subacromial and subdeltoid bursectomy.  Next, we turned our attention to the subacromial space.  In the subacromial space we introduced the posterior viewing cannula just deep to the posterior border of the acromion.  We then used a spinal needle to localize a lateral working portal just 4 cm lateral to the edge of the acromion.  Through this working portal we performed a wide bursectomy and evaluated the rotator cuff tendon which was intact.  We then performed a formal subacromial decompression while viewing from the lateral  portal and working from a posterior portal.  We used a cutting block technique and the motorized bur to transition the type II acromion to a nice flat type I.  We did also note a large undersurface spur on the distal clavicle and medial acromion.  This was likewise leveled off with the motorized bur.  All instruments were then removed from the shoulder.  Fluid was allowed to egress.  We then closed arthroscopic portals with 3-0 nylon.  The arm was cleaned and dried and standard sterile dressing was applied.  The arm was removed from the arm holder and placed in a sling.  Patient was then awakened from general anesthetic with no noted complications.  All counts were correct x2.  Disposition:   He will be nonweightbearing to the left upper extremity for the next 2 weeks.  He can begin immediate range of motion as tolerated.  A sling was placed for comfort.  Once his interscalene nerve block resolves he can move the arm as tolerated.  I will see him back in the office in 2 weeks for suture removal.

## 2019-10-08 ENCOUNTER — Encounter: Payer: BC Managed Care – PPO | Admitting: Occupational Therapy

## 2019-10-08 ENCOUNTER — Ambulatory Visit: Payer: BC Managed Care – PPO | Admitting: Physical Therapy

## 2019-10-15 ENCOUNTER — Encounter: Payer: Self-pay | Admitting: Speech Pathology

## 2019-10-15 ENCOUNTER — Other Ambulatory Visit: Payer: Self-pay

## 2019-10-15 ENCOUNTER — Ambulatory Visit: Payer: BC Managed Care – PPO | Attending: Neurology | Admitting: Speech Pathology

## 2019-10-15 DIAGNOSIS — R41841 Cognitive communication deficit: Secondary | ICD-10-CM | POA: Insufficient documentation

## 2019-10-15 NOTE — Patient Instructions (Signed)
   Late in the evening or night time is not the time to have heated discussions or important discussions  Schedule your OT eval  Keep up the good work listening to your body and brain and taking breaks when you feel fatigue or brain fog coming on  Keep up the good work limiting distractions and doing more complex chores when you are most alert  Don't push through if you don't have to - you know you will pay later in the day and the next day
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
## 2019-10-16 ENCOUNTER — Ambulatory Visit: Payer: BC Managed Care – PPO | Admitting: Occupational Therapy

## 2019-11-13 ENCOUNTER — Encounter: Payer: Self-pay | Admitting: Family Medicine

## 2019-12-06 ENCOUNTER — Encounter: Payer: Self-pay | Admitting: Psychology

## 2019-12-06 ENCOUNTER — Ambulatory Visit: Payer: BC Managed Care – PPO | Admitting: Psychology

## 2019-12-06 ENCOUNTER — Other Ambulatory Visit: Payer: Self-pay

## 2019-12-06 ENCOUNTER — Ambulatory Visit (INDEPENDENT_AMBULATORY_CARE_PROVIDER_SITE_OTHER): Payer: BC Managed Care – PPO | Admitting: Psychology

## 2019-12-06 DIAGNOSIS — F331 Major depressive disorder, recurrent, moderate: Secondary | ICD-10-CM | POA: Diagnosis not present

## 2019-12-06 DIAGNOSIS — I609 Nontraumatic subarachnoid hemorrhage, unspecified: Secondary | ICD-10-CM

## 2019-12-06 DIAGNOSIS — R41840 Attention and concentration deficit: Secondary | ICD-10-CM

## 2019-12-06 DIAGNOSIS — R4189 Other symptoms and signs involving cognitive functions and awareness: Secondary | ICD-10-CM
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
## 2019-12-06 NOTE — Progress Notes (Signed)
   Psychometrician Note   Cognitive testing was administered to Bobby [name] by Wallace Keller, B.S. (psychometrist) under the supervision of Dr. Newman Nickels, Ph.D., licensed psychologist on 12/06/19. Mr. Gloster did not appear overtly distressed by the testing session per behavioral observation or responses across self-report questionnaires. Dr. Newman Nickels, Ph.D. checked in with Mr. Veney as needed to manage any distress related to testing procedures (if applicable). Rest breaks were offered.    The battery of tests administered was selected by Dr. Newman Nickels, Ph.D. with consideration to Mr. Rappaport current level of functioning, the nature of his symptoms, emotional and behavioral responses during interview, level of literacy, observed level of motivation/effort, and the nature of the referral question. This battery was communicated to the psychometrist. Communication between Dr. Newman Nickels, Ph.D. and the psychometrist was ongoing throughout the evaluation and Dr. Newman Nickels, Ph.D. was immediately accessible at all times. Dr. Newman Nickels, Ph.D. provided supervision to the psychometrist on the date of this service to the extent necessary to assure the quality of all services provided.    Bobby [name] will return within approximately 1-2 weeks for an interactive feedback session with Dr. Milbert Coulter at which time his test performances, clinical impressions, and treatment recommendations will be reviewed in detail. Mr. Schachter understands he can contact our office should he require our assistance before this time.  A total of 185 minutes of billable time were spent face-to-face with Mr. Monrreal by the psychometrist. This includes both test administration and scoring time. Billing for these services is reflected in the clinical report generated by Dr. Newman Nickels, Ph.D..  This note reflects time spent with the psychometrician and does not include test scores or any clinical  interpretations made by Dr. Milbert Coulter. The full report will follow in a separate note.

## 2019-12-06 NOTE — Progress Notes (Signed)
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 the individual being tested may be assumed to have exerted adequate effort during testing. Mr. Hamre expressed his intention to perform to the best of his abilities and exhibited adequate task engagement and persistence. Scores across stand-alone and embedded performance validity measures were within expectation. As such, the results of the current evaluation are believed to be a valid representation of Mr. Tozzi current cognitive functioning.  Test Results: Mr. Rasnic was fully oriented at the time of the current evaluation.  Intellectual abilities based upon educational and vocational attainment were estimated to be in the average range. Premorbid abilities were estimated to be within the above average range based upon a single-word reading test. Performance on a combination of subtests used to estimate a short-form FSIQ was in the lower limits of the average range, brought down by deficits in processing speed.   Processing speed was variable, ranging from the exceptionally low to above average normative ranges. Basic attention was average. More complex attention (e.g., working memory) was above average to well above average. Executive functioning was largely average. A somewhat isolated weaknesses was noted  across a visuomotor cognitive flexibility task (TMT B) which scored in the well below average range.  Assessed receptive language abilities were average. Likewise, Mr. Goya did not exhibit any difficulties comprehending task instructions and answered all questions asked of him appropriately. Sentence repetition was average. Assessed expressive language (e.g., verbal fluency and confrontation naming) was average.     Assessed visuospatial/visuoconstructional abilities were largely above average to exceptionally high. An isolated weakness across a timed task assessing spatial manipulation (WAIS-IV Visual Puzzles) scored in the well below average range, potentially impacted by deficits in processing speed.    Learning (i.e., encoding) of novel verbal and visual information was below average to average. Spontaneous delayed recall (i.e., retrieval) of previously learned information was below average to average. Retention rates were 100% across a story learning task, 86% across a list learning task, and 100% across a shape learning task. Performance across recognition tasks was appropriate, suggesting evidence for information consolidation.   Results of emotional screening instruments suggested that recent symptoms of generalized anxiety were in the moderate range, while symptoms of depression were within the severe range. A screening instrument assessing recent sleep quality suggested the presence of minimal sleep dysfunction. Responses across a concussion symptom inventory revealed greatest areas of difficulty surrounding fatigue, mood-related concerns, and concentration difficulties.   Tables of Scores:   Note: This summary of test scores accompanies the interpretive report and should not be considered in isolation without reference to the appropriate sections in the text. Descriptors are based on appropriate normative data and may be adjusted based on clinical judgment. The terms "impaired" and "within  normal limits (WNL)" are  used when a more specific level of functioning cannot be determined.       Effort Testing:   DESCRIPTOR       ACS Word Choice: --- --- Within Expectation  Medical Symptom Validity Test:       Immediate Recall --- --- Within Expectation    Delayed Recall --- --- Within Expectation    Consistency --- --- Within Expectation  WAIS-IV Reliable Digit Span: --- --- Within Expectation  D-KEFS Color Word Effort Index: --- --- Within Expectation       Orientation:      Raw Score Percentile   NAB Orientation, Form 1 29/29 --- ---       Intellectual Functioning:           Standard Score Percentile   Test of Premorbid Functioning: 115 84 Above Average       Wechsler Adult Intelligence Scale (WAIS-IV) Short Form*: Standard Score/ Scaled Score Percentile   Full Scale IQ  92 30 Average    Information  12 75 Above Average    Visual Puzzles 5 5 Well Below Average    Digit Span 13 84 Above Average    Coding 5 5 Well Below Average  *From [employer] & Ryan (2009)          Memory:          NAB Memory Module, Form 2: Standard Score/ T Score Percentile   Total Memory Index 94 34 Average  List Learning       Total Trials 1-3 18/36 (37) 9 Below Average    List B 7/12 (65) 93 Well Above Average    Short Delay Free Recall 7/12 (44) 27 Average    Long Delay Free Recall 6/12 (40) 16 Below Average    Retention Percentage 86 (44) 27 Average    Recognition Discriminability 9 (50) 50 Average  Shape Learning       Total Trials 1-3 16/27 (46) 34 Average    Delayed Recall 6/9 (49) 46 Average    Retention Percentage 100 (51) 54 Average    Recognition Discriminability 7 (47) 38 Average  Story Learning       Immediate Recall 58/80 (57) 75 Above Average    Delayed Recall 32/40 (59) 82 Above Average    Retention Percentage 100 (56) 73 Average  Daily Living Memory       Immediate Recall 44/51 (49) 46 Average    Delayed Recall 15/17 (51) 54 Average    Retention Percentage 88 (50) 50  Average    Recognition Hits 9/10 (49) 46 Average       Attention/Executive Function:          Trail Making Test (TMT): Raw Score (T Score) Percentile     Part A 48 secs.,  0 errors (29) 2 Exceptionally Low    Part B 100 secs.,  0 errors (36) 8 Well Below Average         Scaled Score Percentile   WAIS-IV Coding: 5 5 Well Below Average        Scaled Score Percentile   WAIS-IV Digit Span: 13 84 Above Average    Forward 9 37 Average    Backward 12 75 Above Average    Sequencing 15 95 Well Above Average       D-KEFS Color-Word Interference Test: Raw Score (Scaled Score) Percentile     Color Naming 28 secs. (10) 50 Average    Word Reading 19 secs. (12) 75 Above Average  Inhibition 64 secs. (8) 25 Average      Total Errors 4 errors (6) 9 Below Average    Inhibition/Switching 65 secs. (9) 37 Average      Total Errors 0 errors (12) 75 Above Average       D-KEFS Verbal Fluency Test: Raw Score (Scaled Score) Percentile     Letter Total Correct 43 (11) 63 Average    Category Total Correct 36 (8) 25 Average    Category Switching Total Correct 12 (8) 25 Average    Category Switching Accuracy 11 (9) 37 Average      Total Set Loss Errors 0 (13) 84 Above Average      Total Repetition Errors 0 (12) 75 Above Average       [employer] Test: Raw Score Percentile     Categories (trials) 5 (64) >16 Within Normal Limits    Total Errors 7 69 Average    Perseverative Errors 4 46 Average    Non-Perseverative Errors 3 62 Average    Failure to Maintain Set 0 --- ---       Language:           Raw Score Percentile   Sentence Repetition: 16/22 73 Average       NAB Language Module, Form 1: T Score Percentile     Auditory Comprehension 55 69 Average    Naming 31/31 (53) 62 Average       Visuospatial/Visuoconstruction:          NAB Spatial Module, Form 1: T Score Percentile     Figure Drawing Copy 71 98 Exceptionally High    Figure Drawing Immediate Recall 59 82 Above Average         Scaled Score Percentile   WAIS-IV Block Design: 12 75 Above Average  WAIS-IV Matrix Reasoning: 13 84 Above Average  WAIS-IV Visual Puzzles: 5 5 Well Below Average       Mood and Personality:      Raw Score Percentile   Beck Depression Inventory - II: 32 --- Severe  PROMIS Anxiety Questionnaire: 23 --- Moderate       Additional Questionnaires:      Raw Score Percentile   PROMIS Sleep Disturbance Questionnaire: 23 --- None to Slight       [place] Postconcussion Symptom Inventory: Raw Score Percentile   Total Score 32 --- Extremely High    Headaches 2 --- Mild    Dizziness/Light-headed 2 --- Mild    Nausea/Feeling Sick 0 --- None    Fatigue 4 --- Moderate or Greater    Extra Sensitive to Noises 2 --- Mild    Irritable 3 --- Moderate or Greater    Feeling Sad 4 --- Moderate or Greater    Nervous or Tense 2 --- Mild    Temper Problems 2 --- Mild    Poor Concentration 4 --- Moderate or Greater    Memory Problems 3 --- Moderate or Greater    Difficulty Reading 3 --- Moderate or Greater    Poor Sleep 1 --- Mild   Informed Consent and Coding/Compliance:   Mr. Ghosh was provided with a verbal description of the nature and purpose of the present neuropsychological evaluation. Also reviewed were the foreseeable risks and/or discomforts and benefits of the procedure, limits of confidentiality, and mandatory reporting requirements of this provider. The patient was given the opportunity to ask questions and receive answers about the evaluation. Oral consent to participate was provided by the patient.   This evaluation  was conducted by Newman Nickels, Ph.D., licensed clinical neuropsychologist. Mr. Mwangi completed a comprehensive clinical interview with Dr. Milbert Coulter, billed as one unit [phone], and 185 minutes of cognitive testing and scoring, billed as one unit [phone] and five additional units [id number]. Psychometrist Wallace Keller, B.S., assisted Dr. Milbert Coulter with test administration and  scoring procedures. As a separate and discrete service, Dr. Milbert Coulter spent a total of 180 minutes in interpretation and report writing billed as one unit [phone] and two units 96133.

## 2019-12-13 ENCOUNTER — Ambulatory Visit (INDEPENDENT_AMBULATORY_CARE_PROVIDER_SITE_OTHER): Payer: BC Managed Care – PPO | Admitting: Psychology

## 2019-12-13 ENCOUNTER — Other Ambulatory Visit: Payer: Self-pay

## 2019-12-13 DIAGNOSIS — I609 Nontraumatic subarachnoid hemorrhage, unspecified: Secondary | ICD-10-CM | POA: Diagnosis not present

## 2019-12-13 DIAGNOSIS — S069X9S Unspecified intracranial injury with loss of consciousness of unspecified duration, sequela: Secondary | ICD-10-CM | POA: Diagnosis not present
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
## 2019-12-25 ENCOUNTER — Ambulatory Visit: Payer: BC Managed Care – PPO | Admitting: Family Medicine

## 2019-12-25 ENCOUNTER — Other Ambulatory Visit: Payer: Self-pay

## 2019-12-25 DIAGNOSIS — F418 Other specified anxiety disorders: Secondary | ICD-10-CM

## 2019-12-25 MED ORDER — CITALOPRAM HYDROBROMIDE 40 MG PO TABS: ORAL_TABLET | ORAL | 0 refills | Status: DC

## 2019-12-25 NOTE — Telephone Encounter (Signed)
Patient notified 30 day supply sent in until appt. Nothing further needed.

## 2019-12-25 NOTE — Telephone Encounter (Signed)
Patient came in to office today thinking he had an appt with Dr. Claiborne Billings. I told him that I had tried to reach him 2x by phone and 1 time by mychart, letting him Dr. Claiborne Billings was not going to be in office, to reschedule his appt.  I verified the phone numbers we had listed for him; he stated they are correct.   I rescheduled his appt until 8/2.  Patient was not going to be available for any appts, so I had to work with we had with Dr. Alan Ripper schedule also.   He will run out of meds before appt.  citalopram (CELEXA) 40 MG tablet [[id number]]   [facility] [address], [place] - [address]
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
## 2020-01-10 ENCOUNTER — Ambulatory Visit: Payer: Self-pay | Admitting: Neurology

## 2020-01-14 ENCOUNTER — Encounter: Payer: Self-pay | Admitting: Family Medicine

## 2020-01-14 ENCOUNTER — Ambulatory Visit (INDEPENDENT_AMBULATORY_CARE_PROVIDER_SITE_OTHER): Payer: BC Managed Care – PPO | Admitting: Family Medicine

## 2020-01-14 ENCOUNTER — Other Ambulatory Visit: Payer: Self-pay

## 2020-01-14 VITALS — BP 134/82 | HR 75 | Temp 97.7°F | Ht 73.0 in | Wt >= 6400 oz

## 2020-01-14 DIAGNOSIS — F902 Attention-deficit hyperactivity disorder, combined type: Secondary | ICD-10-CM

## 2020-01-14 DIAGNOSIS — E538 Deficiency of other specified B group vitamins: Secondary | ICD-10-CM

## 2020-01-14 DIAGNOSIS — F418 Other specified anxiety disorders: Secondary | ICD-10-CM | POA: Diagnosis not present

## 2020-01-14 DIAGNOSIS — E039 Hypothyroidism, unspecified: Secondary | ICD-10-CM | POA: Diagnosis not present

## 2020-01-14 DIAGNOSIS — Z8679 Personal history of other diseases of the circulatory system: Secondary | ICD-10-CM

## 2020-01-14 DIAGNOSIS — E559 Vitamin D deficiency, unspecified: Secondary | ICD-10-CM

## 2020-01-14 DIAGNOSIS — S069X9S Unspecified intracranial injury with loss of consciousness of unspecified duration, sequela: Secondary | ICD-10-CM

## 2020-01-14 MED ORDER — CITALOPRAM HYDROBROMIDE 40 MG PO TABS: ORAL_TABLET | ORAL | 1 refills | Status: DC

## 2020-01-14 NOTE — Patient Instructions (Addendum)
I have refilled your celexa.  I will call you within next day or 2 to suggest other med after reviewing history and insurance.
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
## 2020-01-14 NOTE — Progress Notes (Signed)
Patient presenting to discuss ADHD management. States that with recent head injury and depressive changes his medications are no longer helping with his attention and motivation. Would like to discuss possible medication changes to help with mood and productivity.

## 2020-01-15 ENCOUNTER — Other Ambulatory Visit (INDEPENDENT_AMBULATORY_CARE_PROVIDER_SITE_OTHER): Payer: BC Managed Care – PPO

## 2020-01-15 ENCOUNTER — Encounter: Payer: Self-pay | Admitting: Family Medicine

## 2020-01-15 ENCOUNTER — Telehealth: Payer: Self-pay | Admitting: Family Medicine

## 2020-01-15 DIAGNOSIS — F418 Other specified anxiety disorders: Secondary | ICD-10-CM

## 2020-01-15 DIAGNOSIS — E559 Vitamin D deficiency, unspecified: Secondary | ICD-10-CM

## 2020-01-15 DIAGNOSIS — E538 Deficiency of other specified B group vitamins: Secondary | ICD-10-CM

## 2020-01-15 MED ORDER — ARIPIPRAZOLE 5 MG PO TABS: 5.0000 mg | ORAL_TABLET | Freq: Every day | ORAL | 1 refills | Status: DC

## 2020-01-15 NOTE — Telephone Encounter (Signed)
Please inform patient: While we wait on him to get his labs collected today and those results, I would like to go ahead and add Abilify to his depression anxiety regimen. He will continue Celexa and add Abilify which is once daily dosing also.  Once I get his lab results back we will address the ADHD medication also.  Please have him schedule a follow-up appointment in 6 weeks.

## 2020-01-15 NOTE — Telephone Encounter (Signed)
Patient notified and verbalized understanding. Patient stated that he will make his follow-up appointment when office calls back with lab results.

## 2020-01-16 ENCOUNTER — Telehealth: Payer: Self-pay | Admitting: Family Medicine

## 2020-01-16 LAB — TSH: TSH: 2.62 u[IU]/mL (ref 0.35–4.50)

## 2020-01-16 LAB — T4, FREE: Free T4: 0.75 ng/dL (ref 0.60–1.60)

## 2020-01-16 LAB — VITAMIN D 25 HYDROXY (VIT D DEFICIENCY, FRACTURES): VITD: 14.43 ng/mL — ABNORMAL LOW (ref 30.00–100.00)

## 2020-01-16 LAB — VITAMIN B12: Vitamin B-12: 288 pg/mL (ref 211–911)

## 2020-01-16 MED ORDER — LISDEXAMFETAMINE DIMESYLATE 20 MG PO CAPS: 20.0000 mg | ORAL_CAPSULE | Freq: Every day | ORAL | 0 refills | Status: DC

## 2020-01-16 MED ORDER — VITAMIN D (ERGOCALCIFEROL) 1.25 MG (50000 UNIT) PO CAPS: ORAL_CAPSULE | ORAL | 0 refills | Status: DC

## 2020-01-16 NOTE — Telephone Encounter (Signed)
Please inform patient the following information: His thyroid function is normal.  VIT D levels and B12 levels are severely low.  vitd 14> Goal at least above 30 and ideally 40-50> I have called in supplement for him.  start high dose Vit D supplement on Monday and Friday WITH FOOD (can pick 2 days a week that work for him to help stay on track).  Start OTC B12 supplement. In order for him to be able to absorb it he needs to purchase the sublingual solution that is placed under the tongue daily. This can be purchased at pharmacies or even [facility]. Dose can be 2500-5000 mcg daily.     Both of the above deficiencies will cause fatigue and memory deficit > starting them will certainly  Help.   Lastly, for his ADHD- I have called in a medication called vyvanse for him to take daily. This is a low dose. I want him to wait one week after he starts the Abilify before starting the vyvanse.  Abilify was  called in yesterday (separate phone note).  Follow up in 5-6 weeks

## 2020-01-16 NOTE — Telephone Encounter (Signed)
VM left for patient to call back.

## 2020-01-21 ENCOUNTER — Telehealth: Payer: Self-pay

## 2020-01-21 NOTE — Telephone Encounter (Signed)
Received prior authorization form for Vyvanse 20mg  is not covered by insurance.   Alternatives are  Guanfacine HCL ER.  Can this medication be switched?  Please advise this.   Thanks

## 2020-01-21 NOTE — Telephone Encounter (Signed)
Tried to reach patient regarding results, phone number listed did not go through

## 2020-01-23 NOTE — Telephone Encounter (Signed)
Called patient lvm for call back to office to discuss results.    Mailed out results to address on file

## 2020-01-24 NOTE — Telephone Encounter (Signed)
Patient has been tried on methylphenidate and guanfacine with side effects. Please perform prior authorization for Vyvanse 20 mg.  Thanks.

## 2020-01-25 NOTE — Telephone Encounter (Signed)
PA completed via covermymeds. Key # is [employer]

## 2020-01-25 NOTE — Telephone Encounter (Signed)
PA approved 01/25/2020 - 01/25/2023. Pharmacy notified

## 2020-02-07 ENCOUNTER — Ambulatory Visit: Payer: BC Managed Care – PPO | Admitting: Psychology

## 2020-03-14 ENCOUNTER — Other Ambulatory Visit: Payer: Self-pay | Admitting: Family Medicine

## 2020-03-31 ENCOUNTER — Encounter: Payer: Self-pay | Admitting: Family Medicine

## 2020-03-31 ENCOUNTER — Ambulatory Visit (INDEPENDENT_AMBULATORY_CARE_PROVIDER_SITE_OTHER): Payer: BC Managed Care – PPO | Admitting: Family Medicine

## 2020-03-31 ENCOUNTER — Other Ambulatory Visit: Payer: Self-pay

## 2020-03-31 VITALS — BP 120/77 | HR 67 | Temp 99.1°F | Ht 73.0 in | Wt >= 6400 oz

## 2020-03-31 DIAGNOSIS — F909 Attention-deficit hyperactivity disorder, unspecified type: Secondary | ICD-10-CM | POA: Diagnosis not present

## 2020-03-31 DIAGNOSIS — E559 Vitamin D deficiency, unspecified: Secondary | ICD-10-CM | POA: Diagnosis not present

## 2020-03-31 DIAGNOSIS — F418 Other specified anxiety disorders: Secondary | ICD-10-CM

## 2020-03-31 DIAGNOSIS — E538 Deficiency of other specified B group vitamins: Secondary | ICD-10-CM

## 2020-03-31 DIAGNOSIS — Z23 Encounter for immunization: Secondary | ICD-10-CM | POA: Diagnosis not present

## 2020-03-31 DIAGNOSIS — S069X9S Unspecified intracranial injury with loss of consciousness of unspecified duration, sequela: Secondary | ICD-10-CM

## 2020-03-31 DIAGNOSIS — R5383 Other fatigue: Secondary | ICD-10-CM

## 2020-03-31 MED ORDER — LISDEXAMFETAMINE DIMESYLATE 30 MG PO CAPS: 30.0000 mg | ORAL_CAPSULE | Freq: Every day | ORAL | 0 refills | Status: DC

## 2020-03-31 MED ORDER — ARIPIPRAZOLE 10 MG PO TABS: 10.0000 mg | ORAL_TABLET | Freq: Every day | ORAL | 1 refills | Status: DC

## 2020-03-31 MED ORDER — CITALOPRAM HYDROBROMIDE 40 MG PO TABS: ORAL_TABLET | ORAL | 1 refills | Status: DC
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
## 2020-03-31 NOTE — Patient Instructions (Addendum)
I have refilled your vyvanse at 30 mg and Abilify 10 mg, both increased. Next appt 5.5 months- sooner if needed  Make appt this week to have labs completed.

## 2020-04-01 ENCOUNTER — Telehealth: Payer: Self-pay

## 2020-04-01 LAB — DRUG MONITORING, PANEL 8 WITH CONFIRMATION, URINE
6 Acetylmorphine: NEGATIVE ng/mL (ref <10)
Alcohol Metabolites: NEGATIVE ng/mL
Amphetamines: NEGATIVE ng/mL (ref <500)
Benzodiazepines: NEGATIVE ng/mL (ref <100)
Buprenorphine, Urine: NEGATIVE ng/mL (ref <5)
Cocaine Metabolite: NEGATIVE ng/mL (ref <150)
Creatinine: 160.7 mg/dL
MDMA: NEGATIVE ng/mL (ref <500)
Marijuana Metabolite: NEGATIVE ng/mL (ref <20)
Opiates: NEGATIVE ng/mL (ref <100)
Oxidant: NEGATIVE ug/mL
Oxycodone: NEGATIVE ng/mL (ref <100)
pH: 5.3 (ref 4.5–9.0)

## 2020-04-01 LAB — DM TEMPLATE

## 2020-04-01 NOTE — Telephone Encounter (Signed)
LVM for pt to cb about a provider pt seen [facility]uffer).   Need more information about provider to call in regarding referral.

## 2020-04-01 NOTE — Telephone Encounter (Signed)
-----   Message from Natalia Leatherwood, DO sent at 03/31/2020  5:55 PM EDT ----- Regarding: Psychology reschedule Which you please check in to this patient's referral to psychology to [facility].  Patient reports he had an appointment scheduled but it was canceled secondary to the provider being out that day.  He has not heard back to reschedule.  He does need to see this specific counselor secondary to his history of traumatic brain injury.  Please see if we can contact this provider's office and get them to call the patient back to schedule.  Thanks Dr. Kirtland Bouchard

## 2020-04-03 ENCOUNTER — Telehealth: Payer: Self-pay | Admitting: Family Medicine

## 2020-04-03 NOTE — Telephone Encounter (Signed)
Patient states pharmacy called him regarding Vyvanse medication increase. Pharmacy states insurance company needs prior authorization before filling. [facility] in [place]. Patient did not have insurance Doctor, general practice.

## 2020-04-03 NOTE — Telephone Encounter (Signed)
Please see phone note from 01/21/2020.  Prior authorization has been completed for the Vyvanse 20 mg when it was started on that date.  It was approved since he had side effects to methylphenidate and guanfacine.  On  his follow-up appointment 03/31/2020 after starting Vyvanse 20 mg, we needed to increase the Vyvanse dosage for better coverage.    -Vyvanse 20 mg was prior authorized up to 01/25/2023.  Unfortunately, we will need to taper up on that medication, but he still should meet criteria for approval.  Please perform prior authorization.  Thanks.

## 2020-04-03 NOTE — Telephone Encounter (Signed)
Rx is not on pt formulary. Insurance prefers Guanfacine HCl ER. Please advise if switch is okay?

## 2020-04-04 ENCOUNTER — Ambulatory Visit: Payer: BC Managed Care – PPO

## 2020-04-07 ENCOUNTER — Ambulatory Visit (INDEPENDENT_AMBULATORY_CARE_PROVIDER_SITE_OTHER): Payer: BC Managed Care – PPO

## 2020-04-07 ENCOUNTER — Other Ambulatory Visit: Payer: Self-pay

## 2020-04-07 DIAGNOSIS — E559 Vitamin D deficiency, unspecified: Secondary | ICD-10-CM | POA: Diagnosis not present

## 2020-04-07 DIAGNOSIS — E538 Deficiency of other specified B group vitamins: Secondary | ICD-10-CM | POA: Diagnosis not present

## 2020-04-07 DIAGNOSIS — R5383 Other fatigue: Secondary | ICD-10-CM

## 2020-04-07 LAB — VITAMIN D 25 HYDROXY (VIT D DEFICIENCY, FRACTURES): VITD: 20.09 ng/mL — ABNORMAL LOW (ref 30.00–100.00)

## 2020-04-07 LAB — VITAMIN B12: Vitamin B-12: 500 pg/mL (ref 211–911)

## 2020-04-07 NOTE — Telephone Encounter (Signed)
PA started on CovermyMeds  Key: [id number] - Rx #: [id number]

## 2020-04-08 ENCOUNTER — Telehealth: Payer: Self-pay | Admitting: Family Medicine

## 2020-04-08 LAB — IRON,TIBC AND FERRITIN PANEL
%SAT: 18 % (calc) — ABNORMAL LOW (ref 20–48)
Ferritin: 97 ng/mL (ref 38–380)
Iron: 56 ug/dL (ref 50–180)
TIBC: 304 mcg/dL (calc) (ref 250–425)

## 2020-04-08 MED ORDER — POLYSACCHARIDE IRON FORTE 150-25-1 MG-MCG-MG PO CAPS: ORAL_CAPSULE | ORAL | 5 refills | Status: DC

## 2020-04-08 MED ORDER — VITAMIN D (ERGOCALCIFEROL) 1.25 MG (50000 UNIT) PO CAPS: ORAL_CAPSULE | ORAL | 0 refills | Status: DC

## 2020-04-08 NOTE — Telephone Encounter (Signed)
PA already started by [place]. PA aprroved

## 2020-04-08 NOTE — Telephone Encounter (Addendum)
Link Snuffer, MSW, LCSW [facility] at [facility] [address] [place], [place] [address] [phone]

## 2020-04-08 NOTE — Telephone Encounter (Signed)
Please inform patient the following information: B12 levels are improving, now 500, continue sublingual B12 daily supplement. Vitamin D levels are improving but still low, at 20. Normal is greater than 30 and ideally levels between 40-50 or desired. I have refilled his high-dose vitamin D to take twice weekly for another 12 weeks. In addition he should start over-the-counter vitamin D 2000 units daily, skipping the over-the-counter dose on the day he takes the high-dose prescribed. Continue the over-the-counter vitamin D daily after the high-dose vitamin D is completed.  His iron levels are on the very low end of normal. When iron levels near 50 (his is 56) and with decrease % saturations, iron deficiency symptoms such as fatigue can arise. Would encourage him to start the iron supplement I called into his pharmacy 3 times a week such as Monday, Wednesday and Friday. 2 precautions when taking iron:   -Some people experience constipation when taking iron. Iron is best absorbed if taken with vitamin C (orange juice) and if constipation arises may need to consider taking a stool softener when taking the iron.   -Also iron supplements should be taken about 4 hours separate from all other medications. Taking iron close to medications can cause other medications not to work as effectively.  Thanks.   Also, what is the status of his psychology referral. We had referred him to [facility] Snuffer ([facility]) therapist which specializes in TBI pts. His appt was canceled d/t provider being ill. At the time of his appt with [place] last, He had not heard back from them concerning rescheduling.

## 2020-04-08 NOTE — Telephone Encounter (Signed)
LVM for Link Snuffer in regards to a mutual pt.

## 2020-04-08 NOTE — Telephone Encounter (Signed)
Left VM for pt to call back (lab results)

## 2020-04-10 NOTE — Telephone Encounter (Signed)
LVM for call back (lab results)

## 2020-05-04 IMAGING — CT CT HEAD WITHOUT CONTRAST
4 series · 16 of 47 positions shown, 18 images · non-contrast
Comparison: Two days ago

CLINICAL DATA: Follow-up subarachnoid hemorrhage

EXAM:
CT HEAD WITHOUT CONTRAST
TECHNIQUE: Contiguous axial images were obtained from the base of the skull
through the vertex without intravenous contrast.

[Series 3: head without · axial · non-contrast · 0.48mm/px · z∈[-62,+78]mm · 7 of 38 slices shown, 9 images]
[im 5/38  brain]
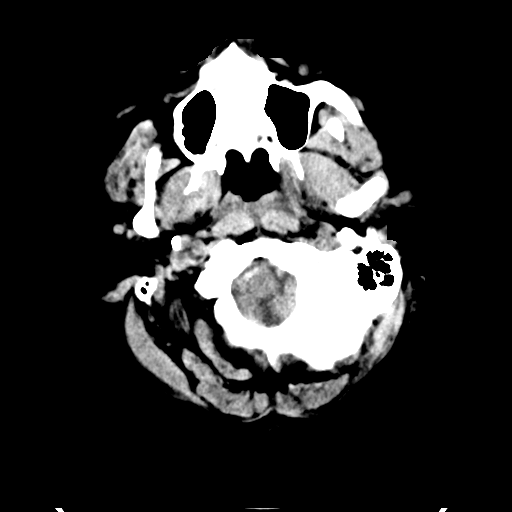
[im 5/38  bone]
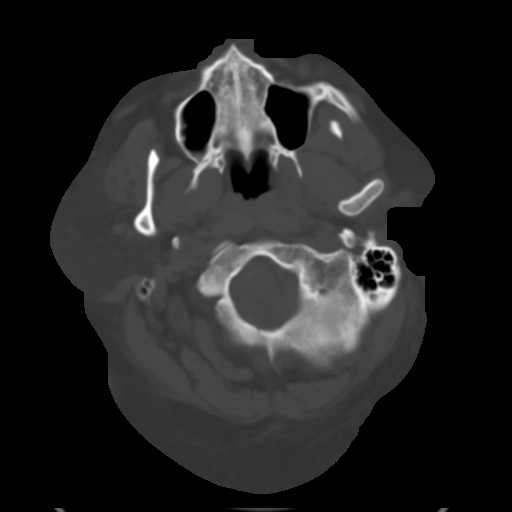
[im 10/38  brain]
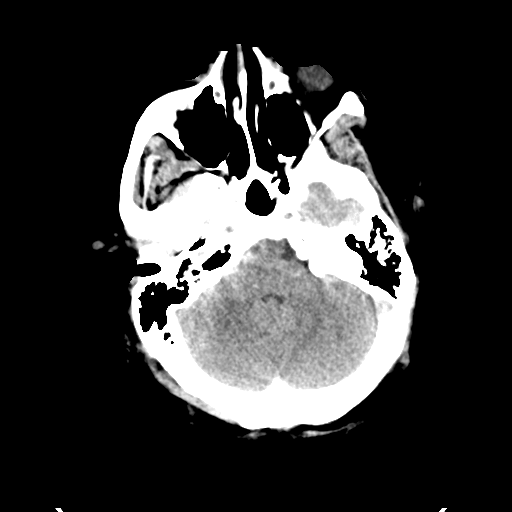
[im 14/38  brain]
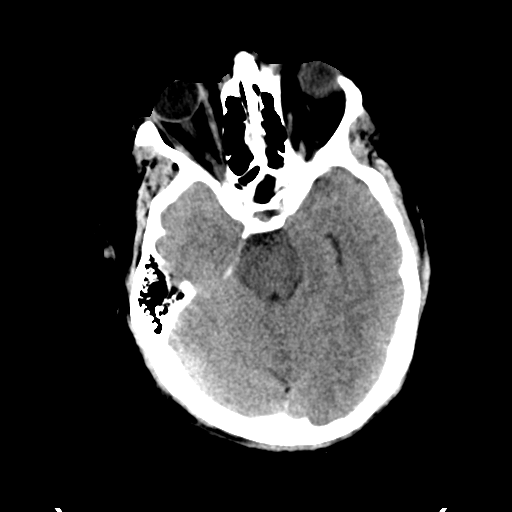
[im 19/38  brain]
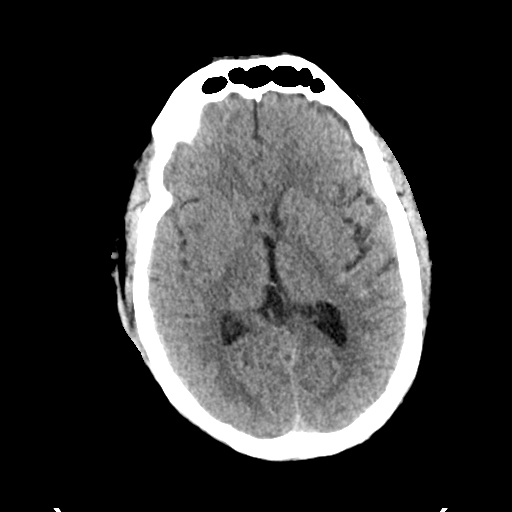
[im 24/38  brain]
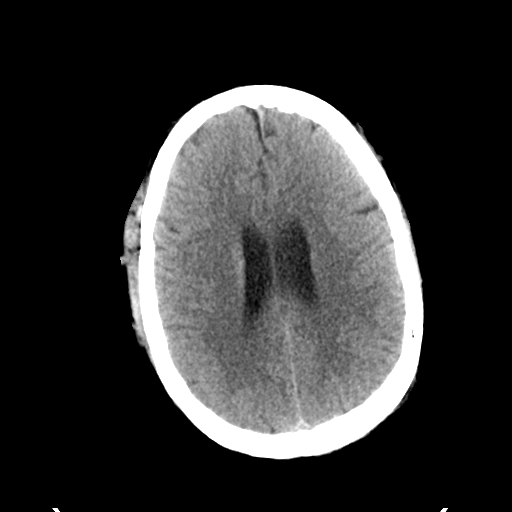
[im 24/38  bone]
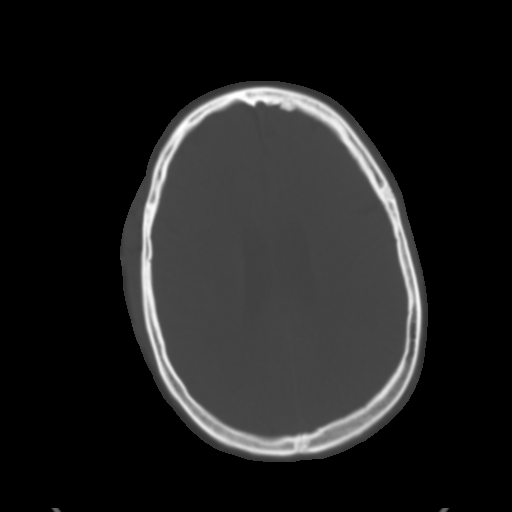
[im 28/38  brain]
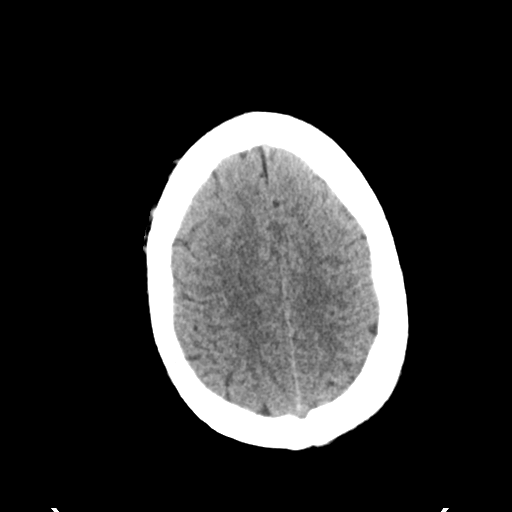
[im 33/38  brain]
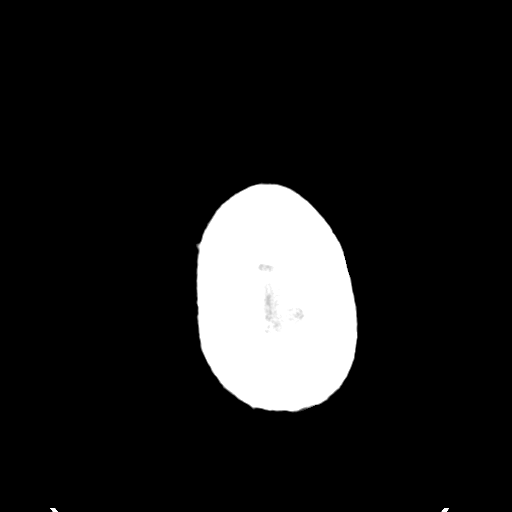

[Series 4: head bone · axial · 0.48mm/px · z∈[-64,-28]mm · 3 of 94 slices shown]
[im 10/94  bone]
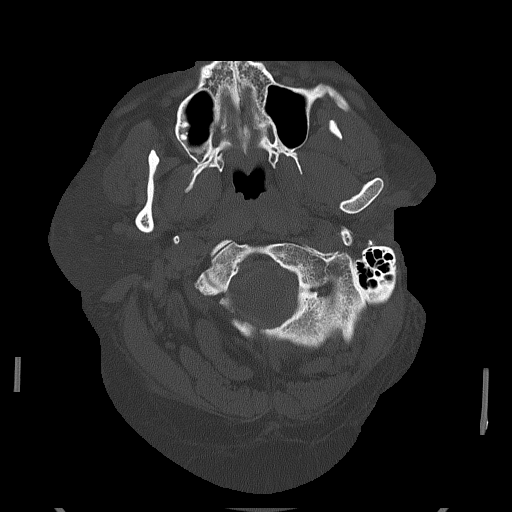
[im 19/94  bone]
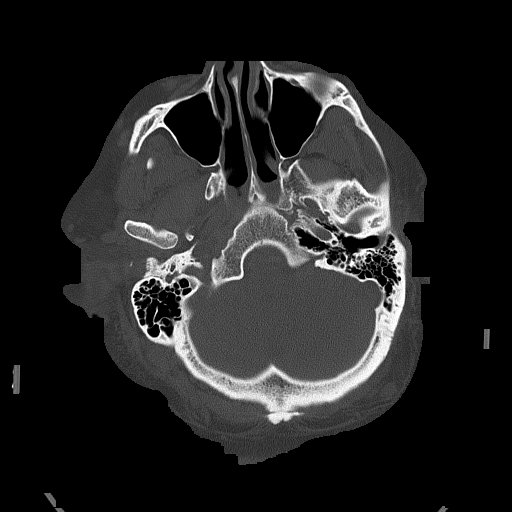
[im 28/94  bone]
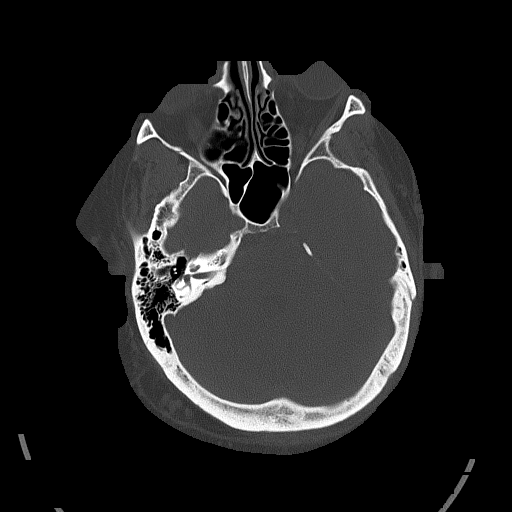

[Series 5: head without cor · coronal · non-contrast · 0.36mm/px · 3 of 76 slices shown]
[im 26/76  brain]
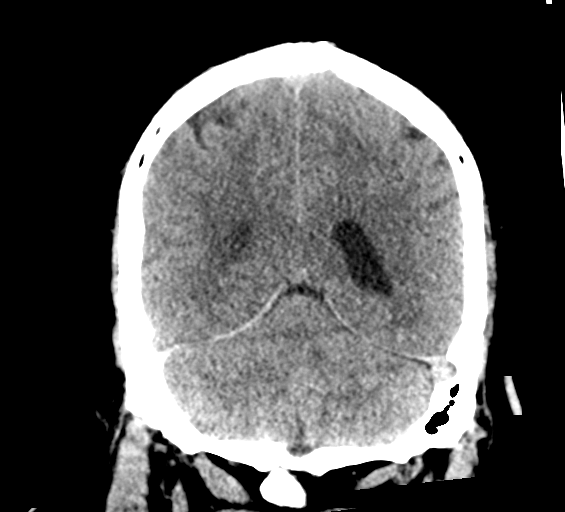
[im 34/76  brain]
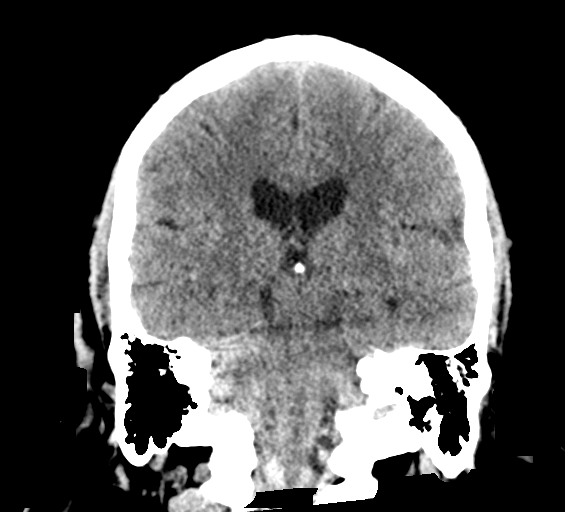
[im 42/76  brain]
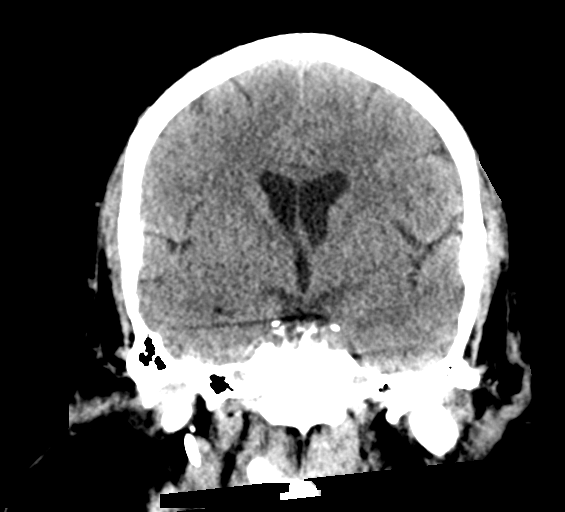

[Series 6: head without sag · sagittal · non-contrast · 0.36mm/px · 3 of 67 slices shown]
[im 23/67  brain]
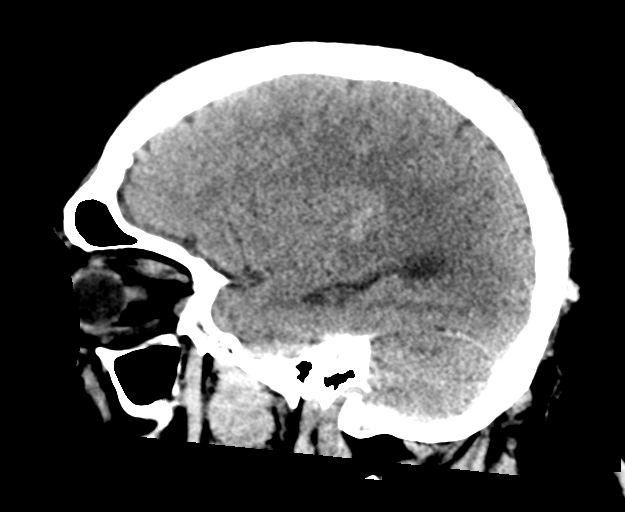
[im 34/67  brain]
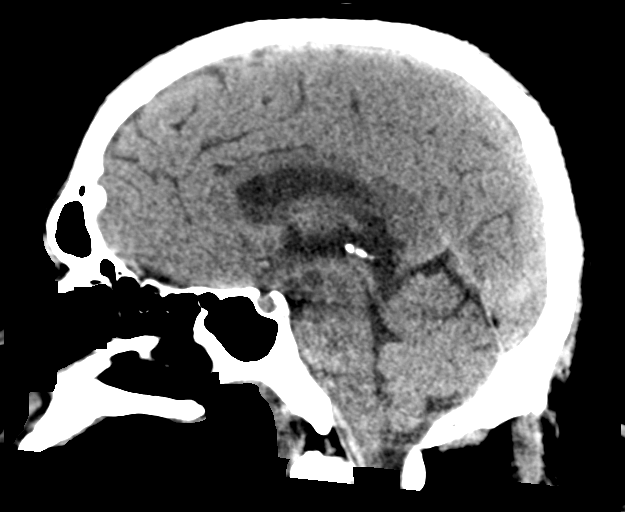
[im 45/67  brain]
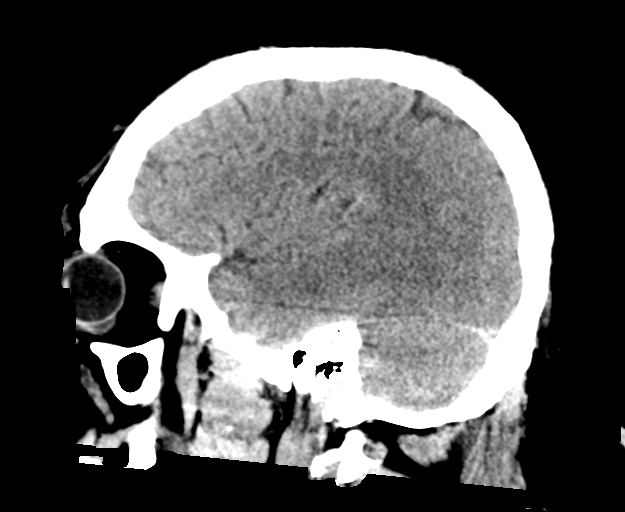

[16 of 47 positions shown; findings below may reference images not displayed]

FINDINGS: Brain: Subarachnoid hemorrhage in the prepontine and right CP angle
cisterns is stable to decreased. Stable small volume subarachnoid
blood in the left sylvian fissure. Small volume blood is seen within
the occipital horns of the lateral ventricles without hydrocephalus.
Trace subdural clot along the tentorium is not progressed. No mass
effect or infarct.

Vascular: Negative

Skull: Negative

Sinuses/Orbits: Of negative

Other: Dermal inclusion cysts partially covered in the midline
suboccipital neck.
IMPRESSION: 1. Newly seen small volume blood layering in the occipital horns of
lateral ventricles, redistribution versus interval bleeding. No
hydrocephalus.
2. Small volume subarachnoid hemorrhage elsewhere is stable to
decreased.
3. Trace subdural hematoma along the tentorium is unchanged.

## 2020-05-05 ENCOUNTER — Ambulatory Visit: Payer: BC Managed Care – PPO | Admitting: Family Medicine

## 2020-05-05 DIAGNOSIS — Z0289 Encounter for other administrative examinations: Secondary | ICD-10-CM
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
## 2020-05-23 ENCOUNTER — Encounter: Payer: Self-pay | Admitting: Family Medicine

## 2020-05-23 ENCOUNTER — Ambulatory Visit: Payer: BC Managed Care – PPO | Admitting: Family Medicine

## 2020-05-23 DIAGNOSIS — Z0289 Encounter for other administrative examinations: Secondary | ICD-10-CM

## 2020-05-27 ENCOUNTER — Ambulatory Visit: Payer: BC Managed Care – PPO | Admitting: Family Medicine

## 2020-05-30 ENCOUNTER — Telehealth: Payer: Self-pay | Admitting: Family Medicine

## 2020-05-30 NOTE — Telephone Encounter (Signed)
Spoke with pt regarding rx and instructions.   

## 2020-05-30 NOTE — Telephone Encounter (Signed)
Pt recently NS a couple of appt. & upcoming appt is for acute reasons. Please advise

## 2020-05-30 NOTE — Telephone Encounter (Signed)
Patient requests refill of Vyvanse, please send to same [facility] in [place].

## 2020-05-30 NOTE — Telephone Encounter (Signed)
Please instruct patient to call his [facility] and may add and pharmacy and speak to a representative in order to get his Vyvanse 30 mg tabs refilled.  He has prescription up therefore Vyvanse on hold.  He was provided with a total of 6 months of prescription on 03/31/2020 Vyvanse 30 mg tabs.  Remind him he will need to call up to his pharmacy and ask representative for a refill on his Vyvanse every time.  He cannot use the prescription number on the bottle for refill since it is a controlled substance.  -Anytime this provider needs to refill his Vyvanse he will need to have a face-to-face visit since it is a controlled substance.  Therefore it is important he maintain his follow-up visits on time for this medication in order to not have to go without.

## 2020-06-11 ENCOUNTER — Ambulatory Visit: Payer: BC Managed Care – PPO | Admitting: Family Medicine

## 2020-06-16 ENCOUNTER — Other Ambulatory Visit: Payer: Self-pay

## 2020-06-17 ENCOUNTER — Ambulatory Visit: Payer: BC Managed Care – PPO | Admitting: Family Medicine

## 2020-06-17 ENCOUNTER — Encounter: Payer: Self-pay | Admitting: Family Medicine

## 2020-06-17 VITALS — BP 150/84 | HR 76 | Temp 98.2°F | Resp 18 | Ht 73.0 in | Wt >= 6400 oz

## 2020-06-17 DIAGNOSIS — R3912 Poor urinary stream: Secondary | ICD-10-CM | POA: Diagnosis not present

## 2020-06-17 DIAGNOSIS — Z87710 Personal history of (corrected) hypospadias: Secondary | ICD-10-CM

## 2020-06-17 DIAGNOSIS — N4830 Priapism, unspecified: Secondary | ICD-10-CM

## 2020-06-17 NOTE — Patient Instructions (Signed)
I have referred you to urology.  They will call you to schedule.    Nice to see you today
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
## 2020-06-24 ENCOUNTER — Other Ambulatory Visit: Payer: Self-pay

## 2020-06-24 ENCOUNTER — Encounter: Payer: Self-pay | Admitting: Urology

## 2020-06-24 ENCOUNTER — Ambulatory Visit (INDEPENDENT_AMBULATORY_CARE_PROVIDER_SITE_OTHER): Payer: BC Managed Care – PPO | Admitting: Urology

## 2020-06-24 VITALS — BP 154/93 | HR 68 | Temp 98.7°F | Ht 73.0 in | Wt >= 6400 oz

## 2020-06-24 DIAGNOSIS — R3912 Poor urinary stream: Secondary | ICD-10-CM

## 2020-06-24 DIAGNOSIS — N471 Phimosis: Secondary | ICD-10-CM

## 2020-06-24 DIAGNOSIS — N4883 Acquired buried penis: Secondary | ICD-10-CM

## 2020-06-24 LAB — URINALYSIS, ROUTINE W REFLEX MICROSCOPIC
Bilirubin, UA: NEGATIVE
Glucose, UA: NEGATIVE
Ketones, UA: NEGATIVE
Leukocytes,UA: NEGATIVE
Nitrite, UA: NEGATIVE
Protein,UA: NEGATIVE
RBC, UA: NEGATIVE
Specific Gravity, UA: 1.02 (ref 1.005–1.030)
Urobilinogen, Ur: 0.2 mg/dL (ref 0.2–1.0)
pH, UA: 6 (ref 5.0–7.5)

## 2020-06-24 NOTE — Progress Notes (Signed)
Urological Symptom Review  Patient is experiencing the following symptoms: Erection problems (male only)   Review of Systems  Gastrointestinal (upper)  : Negative for upper GI symptoms  Gastrointestinal (lower) : Negative for lower GI symptoms  Constitutional : Fatigue  Skin: Negative for skin symptoms  Eyes: Negative for eye symptoms  Ear/Nose/Throat : Negative for Ear/Nose/Throat symptoms  Hematologic/Lymphatic: Negative for Hematologic/Lymphatic symptoms  Cardiovascular : Negative for cardiovascular symptoms  Respiratory : Negative for respiratory symptoms  Endocrine: Negative for endocrine symptoms  Musculoskeletal: Back pain Joint pain  Neurological: Negative for neurological symptoms  Psychologic: Depression Anxiety
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
## 2020-08-13 ENCOUNTER — Encounter: Payer: Self-pay | Admitting: Pulmonary Disease

## 2020-08-13 ENCOUNTER — Other Ambulatory Visit: Payer: Self-pay

## 2020-08-13 ENCOUNTER — Ambulatory Visit (INDEPENDENT_AMBULATORY_CARE_PROVIDER_SITE_OTHER): Payer: BC Managed Care – PPO | Admitting: Pulmonary Disease

## 2020-08-13 VITALS — BP 112/80 | HR 64 | Temp 97.7°F | Ht 73.0 in | Wt >= 6400 oz

## 2020-08-13 DIAGNOSIS — G4733 Obstructive sleep apnea (adult) (pediatric): Secondary | ICD-10-CM | POA: Diagnosis not present

## 2020-08-13 NOTE — Patient Instructions (Signed)
  Prescription for auto CPAP 10 to 20 cm will be sent to DME. CPAP supplies will be renewed-including AirFit F 10 4 of 30 fullface mask-which you can get immediately

## 2020-08-13 NOTE — Assessment & Plan Note (Signed)
He has been very compliant with his CPAP machine, this is certainly helped improve his daytime somnolence and fatigue.  He has gained significant weight and wife has noted some snoring on CPAP of 14 cm, he likely needs higher pressure. We will get him an auto CPAP machine and set it between 10 to 20 cm, will obtain a download after he has used this for a month and tweak settings as needed. He prefers an AirFit F10 fullface mask and would be willing to trial an AirFit F30 if needed. CPAP supplies will be renewed for a year Weight loss encouraged, compliance with goal of at least 4-6 hrs every night is the expectation. Advised against medications with sedative side effects Cautioned against driving when sleepy - understanding that sleepiness will vary on a day to day basis  The pathophysiology of obstructive sleep apnea , it's cardiovascular consequences & modes of treatment including CPAP were discused with the patient in detail & they evidenced understanding.
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
## 2020-08-13 NOTE — Assessment & Plan Note (Signed)
Weight loss clinically important as a long-term goal

## 2020-08-21 ENCOUNTER — Other Ambulatory Visit: Payer: Self-pay | Admitting: Urology

## 2020-09-19 ENCOUNTER — Ambulatory Visit: Payer: BC Managed Care – PPO | Admitting: Family Medicine

## 2020-09-19 ENCOUNTER — Encounter: Payer: Self-pay | Admitting: Family Medicine

## 2020-09-19 ENCOUNTER — Other Ambulatory Visit: Payer: Self-pay

## 2020-09-19 VITALS — BP 137/84 | HR 69 | Temp 98.5°F | Ht 73.0 in | Wt >= 6400 oz

## 2020-09-19 DIAGNOSIS — F909 Attention-deficit hyperactivity disorder, unspecified type: Secondary | ICD-10-CM | POA: Diagnosis not present

## 2020-09-19 DIAGNOSIS — E611 Iron deficiency: Secondary | ICD-10-CM

## 2020-09-19 DIAGNOSIS — S069X9S Unspecified intracranial injury with loss of consciousness of unspecified duration, sequela: Secondary | ICD-10-CM

## 2020-09-19 DIAGNOSIS — E7849 Other hyperlipidemia: Secondary | ICD-10-CM

## 2020-09-19 DIAGNOSIS — E559 Vitamin D deficiency, unspecified: Secondary | ICD-10-CM

## 2020-09-19 DIAGNOSIS — R062 Wheezing: Secondary | ICD-10-CM

## 2020-09-19 DIAGNOSIS — F418 Other specified anxiety disorders: Secondary | ICD-10-CM | POA: Insufficient documentation

## 2020-09-19 DIAGNOSIS — E538 Deficiency of other specified B group vitamins: Secondary | ICD-10-CM

## 2020-09-19 MED ORDER — ARIPIPRAZOLE 10 MG PO TABS: 10.0000 mg | ORAL_TABLET | Freq: Every day | ORAL | 1 refills | Status: DC

## 2020-09-19 MED ORDER — CITALOPRAM HYDROBROMIDE 40 MG PO TABS
ORAL_TABLET | ORAL | 1 refills | Status: DC
Start: 2020-09-19 — End: 2021-08-10

## 2020-09-19 MED ORDER — LISDEXAMFETAMINE DIMESYLATE 30 MG PO CAPS: 30.0000 mg | ORAL_CAPSULE | Freq: Every day | ORAL | 0 refills | Status: DC

## 2020-09-19 MED ORDER — ALBUTEROL SULFATE HFA 108 (90 BASE) MCG/ACT IN AERS: INHALATION_SPRAY | RESPIRATORY_TRACT | 2 refills | Status: DC

## 2020-09-19 NOTE — Patient Instructions (Addendum)
Great to see you today.  We will call  You with lab results.  I have referred you to Neurology (Dr. Everlena Cooper) > they will call you to schedule.   Next appt in 5.5 months- sooner if needed.
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
## 2020-09-20 LAB — COMPREHENSIVE METABOLIC PANEL
AG Ratio: 1.3 (calc) (ref 1.0–2.5)
ALT: 17 U/L (ref 9–46)
AST: 15 U/L (ref 10–40)
Albumin: 4.1 g/dL (ref 3.6–5.1)
Alkaline phosphatase (APISO): 67 U/L (ref 36–130)
BUN: 13 mg/dL (ref 7–25)
CO2: 26 mmol/L (ref 20–32)
Calcium: 9.4 mg/dL (ref 8.6–10.3)
Chloride: 102 mmol/L (ref 98–110)
Creat: 1.17 mg/dL (ref 0.60–1.35)
Globulin: 3.2 g/dL (calc) (ref 1.9–3.7)
Glucose, Bld: 80 mg/dL (ref 65–99)
Potassium: 4.8 mmol/L (ref 3.5–5.3)
Sodium: 138 mmol/L (ref 135–146)
Total Bilirubin: 0.7 mg/dL (ref 0.2–1.2)
Total Protein: 7.3 g/dL (ref 6.1–8.1)

## 2020-09-20 LAB — CBC WITH DIFFERENTIAL/PLATELET
Absolute Monocytes: 462 cells/uL (ref 200–950)
Basophils Absolute: 39 cells/uL (ref 0–200)
Basophils Relative: 0.5 %
Eosinophils Absolute: 62 cells/uL (ref 15–500)
Eosinophils Relative: 0.8 %
HCT: 39.8 % (ref 38.5–50.0)
Hemoglobin: 13.3 g/dL (ref 13.2–17.1)
Lymphs Abs: 1940 cells/uL (ref 850–3900)
MCH: 27.7 pg (ref 27.0–33.0)
MCHC: 33.4 g/dL (ref 32.0–36.0)
MCV: 82.9 fL (ref 80.0–100.0)
MPV: 10.8 fL (ref 7.5–12.5)
Monocytes Relative: 6 %
Neutro Abs: 5198 cells/uL (ref 1500–7800)
Neutrophils Relative %: 67.5 %
Platelets: 316 10*3/uL (ref 140–400)
RBC: 4.8 10*6/uL (ref 4.20–5.80)
RDW: 14.5 % (ref 11.0–15.0)
Total Lymphocyte: 25.2 %
WBC: 7.7 10*3/uL (ref 3.8–10.8)

## 2020-09-20 LAB — LIPID PANEL
Cholesterol: 215 mg/dL — ABNORMAL HIGH (ref <200)
HDL: 40 mg/dL (ref >=40)
LDL Cholesterol (Calc): 144 mg/dL (calc) — ABNORMAL HIGH
Non-HDL Cholesterol (Calc): 175 mg/dL (calc) — ABNORMAL HIGH (ref <130)
Total CHOL/HDL Ratio: 5.4 (calc) — ABNORMAL HIGH (ref <5.0)
Triglycerides: 169 mg/dL — ABNORMAL HIGH (ref <150)

## 2020-09-20 LAB — TSH: TSH: 2.2 mIU/L (ref 0.40–4.50)

## 2020-09-20 LAB — IRON,TIBC AND FERRITIN PANEL
%SAT: 19 % (calc) — ABNORMAL LOW (ref 20–48)
Ferritin: 109 ng/mL (ref 38–380)
Iron: 55 ug/dL (ref 50–180)
TIBC: 285 mcg/dL (calc) (ref 250–425)

## 2020-09-20 LAB — VITAMIN D 25 HYDROXY (VIT D DEFICIENCY, FRACTURES): Vit D, 25-Hydroxy: 29 ng/mL — ABNORMAL LOW (ref 30–100)

## 2020-09-20 LAB — VITAMIN B12: Vitamin B-12: 489 pg/mL (ref 200–1100)

## 2020-09-22 ENCOUNTER — Telehealth: Payer: Self-pay | Admitting: Family Medicine

## 2020-09-22 NOTE — Telephone Encounter (Signed)
Please call patient Liver, kidney and thyroid function are normal Blood cell counts and electrolytes are normal Vitamin D is 29, this is just below normal.  Would encourage him to take a supplement daily. Iron levels were normal. B12 levels are normal. Cholesterol panel is borderline.  With a LDL/bad cholesterol 143 and triglycerides of 169.  LDL goal should be around 834 and triglycerides below 150.  Again, this is a borderline reading and cholesterol medication is not indicated at this time.  However monitoring his diet, decrease saturated fats, deep-fried foods and increasing fresh vegetables, fruits and fiber would be helpful to help lower his cholesterol.  Adding 2 servings of Metamucil a day would also be helpful.

## 2020-09-22 NOTE — Telephone Encounter (Signed)
Spoke with pt regarding labs and instructions.   

## 2020-10-07 ENCOUNTER — Ambulatory Visit (INDEPENDENT_AMBULATORY_CARE_PROVIDER_SITE_OTHER): Payer: BC Managed Care – PPO | Admitting: Urology

## 2020-10-07 ENCOUNTER — Other Ambulatory Visit: Payer: Self-pay

## 2020-10-07 ENCOUNTER — Encounter: Payer: Self-pay | Admitting: Urology

## 2020-10-07 VITALS — BP 144/50 | HR 76 | Temp 98.3°F

## 2020-10-07 DIAGNOSIS — R3912 Poor urinary stream: Secondary | ICD-10-CM | POA: Diagnosis not present

## 2020-10-07 DIAGNOSIS — N471 Phimosis: Secondary | ICD-10-CM | POA: Diagnosis not present

## 2020-10-07 LAB — MICROSCOPIC EXAMINATION
Bacteria, UA: NONE SEEN
Epithelial Cells (non renal): NONE SEEN /hpf (ref 0–10)
Renal Epithel, UA: NONE SEEN /hpf
WBC, UA: NONE SEEN /hpf (ref 0–5)

## 2020-10-07 LAB — URINALYSIS, ROUTINE W REFLEX MICROSCOPIC
Bilirubin, UA: NEGATIVE
Glucose, UA: NEGATIVE
Ketones, UA: NEGATIVE
Leukocytes,UA: NEGATIVE
Nitrite, UA: NEGATIVE
Protein,UA: NEGATIVE
Specific Gravity, UA: 1.01 (ref 1.005–1.030)
Urobilinogen, Ur: 0.2 mg/dL (ref 0.2–1.0)
pH, UA: 6 (ref 5.0–7.5)
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
## 2020-10-07 NOTE — Progress Notes (Signed)
Urological Symptom Review  Patient is experiencing the following symptoms: Hard to postpone urination Erection problems (male only)   Review of Systems  Gastrointestinal (upper)  : Negative for upper GI symptoms  Gastrointestinal (lower) : Negative for lower GI symptoms  Constitutional : Fatigue  Skin: Negative for skin symptoms  Eyes: Negative for eye symptoms  Ear/Nose/Throat : Negative for Ear/Nose/Throat symptoms  Hematologic/Lymphatic: Negative for Hematologic/Lymphatic symptoms  Cardiovascular : Negative for cardiovascular symptoms  Respiratory : Negative for respiratory symptoms  Endocrine: Negative for endocrine symptoms  Musculoskeletal: Negative for musculoskeletal symptoms  Neurological: Negative for neurological symptoms  Psychologic: Anxiety

## 2020-10-08 NOTE — Progress Notes (Signed)
DUE TO COVID-19 ONLY ONE VISITOR IS ALLOWED TO COME WITH YOU AND STAY IN THE WAITING ROOM ONLY DURING PRE OP AND PROCEDURE DAY OF SURGERY. THE 1 VISITOR  MAY VISIT WITH YOU AFTER SURGERY IN YOUR PRIVATE ROOM DURING VISITING HOURS ONLY!  YOU NEED TO HAVE A COVID 19 TEST ON___4/28/2022 ____ @_______ , THIS TEST MUST BE DONE BEFORE SURGERY,  COVID TESTING SITE [address] [place] , IT IS ON THE RIGHT GOING OUT [address] APPROXIMATELY  2 MINUTES PAST [employer] ON THE RIGHT. ONCE YOUR COVID TEST IS COMPLETED,  PLEASE BEGIN THE QUARANTINE INSTRUCTIONS AS OUTLINED IN YOUR HANDOUT.                Bobby [name]  10/08/2020   Your procedure is scheduled on:  10/13/2020   Report to [facility] Main  Entrance   Report to admitting at    0800 AM     Call this number if you have problems the morning of surgery [phone]    Remember: Do not eat food , candy gum or mints :After Midnight. You may have clear liquids from midnight until 0700am     CLEAR LIQUID DIET   Foods Allowed                                                                       Coffee and tea, regular and decaf                              Plain Jell-O any favor except red or purple                                            Fruit ices (not with fruit pulp)                                      Iced Popsicles                                     Carbonated beverages, regular and diet                                    Cranberry, grape and apple juices Sports drinks like Gatorade Lightly seasoned clear broth or consume(fat free) Sugar, honey syrup   _____________________________________________________________________    BRUSH YOUR TEETH MORNING OF SURGERY AND RINSE YOUR MOUTH OUT, NO CHEWING GUM CANDY OR MINTS.     Take these medicines the morning of surgery with A SIP OF WATER:  Inhalers as usual and bring  DO NOT TAKE ANY DIABETIC MEDICATIONS DAY OF YOUR SURGERY                                You may not have  any metal on your body including hair pins and              piercings  Do not wear jewelry, make-up, lotions, powders or perfumes, deodorant             Do not wear nail polish on your fingernails.  Do not shave  48 hours prior to surgery.              Men may shave face and neck.   Do not bring valuables to the hospital. [place].  Contacts, dentures or bridgework may not be worn into surgery.  Leave suitcase in the car. After surgery it may be brought to your room.     Patients discharged the day of surgery will not be allowed to drive home. IF YOU ARE HAVING SURGERY AND GOING HOME THE SAME DAY, YOU MUST HAVE AN ADULT TO DRIVE YOU HOME AND BE WITH YOU FOR 24 HOURS. YOU MAY GO HOME BY TAXI OR UBER OR ORTHERWISE, BUT AN ADULT MUST ACCOMPANY YOU HOME AND STAY WITH YOU FOR 24 HOURS.  Name and phone number of your driver:  Special Instructions: N/A              Please read over the following fact sheets you were given: _____________________________________________________________________  [facility] - Preparing for Surgery Before surgery, you can play an important role.  Because skin is not sterile, your skin needs to be as free of germs as possible.  You can reduce the number of germs on your skin by washing with CHG (chlorahexidine gluconate) soap before surgery.  CHG is an antiseptic cleaner which kills germs and bonds with the skin to continue killing germs even after washing. Please DO NOT use if you have an allergy to CHG or antibacterial soaps.  If your skin becomes reddened/irritated stop using the CHG and inform your nurse when you arrive at Short Stay. Do not shave (including legs and underarms) for at least 48 hours prior to the first CHG shower.  You may shave your face/neck. Please follow these instructions carefully:  1.  Shower with CHG Soap the night before surgery and the  morning of  Surgery.  2.  If you choose to wash your hair, wash your hair first as usual with your  normal  shampoo.  3.  After you shampoo, rinse your hair and body thoroughly to remove the  shampoo.                           4.  Use CHG as you would any other liquid soap.  You can apply chg directly  to the skin and wash                       Gently with a scrungie or clean washcloth.  5.  Apply the CHG Soap to your body ONLY FROM THE NECK DOWN.   Do not use on face/ open                           Wound or open sores. Avoid contact with eyes, ears mouth and genitals (private parts).                       Wash  face,  Genitals (private parts) with your normal soap.             6.  Wash thoroughly, paying special attention to the area where your surgery  will be performed.  7.  Thoroughly rinse your body with warm water from the neck down.  8.  DO NOT shower/wash with your normal soap after using and rinsing off  the CHG Soap.                9.  Pat yourself dry with a clean towel.            10.  Wear clean pajamas.            11.  Place clean sheets on your bed the night of your first shower and do not  sleep with pets. Day of Surgery : Do not apply any lotions/deodorants the morning of surgery.  Please wear clean clothes to the hospital/surgery center.  FAILURE TO FOLLOW THESE INSTRUCTIONS MAY RESULT IN THE CANCELLATION OF YOUR SURGERY PATIENT SIGNATURE_________________________________  NURSE SIGNATURE__________________________________  ________________________________________________________________________

## 2020-10-09 ENCOUNTER — Encounter (HOSPITAL_COMMUNITY)
Admission: RE | Admit: 2020-10-09 | Discharge: 2020-10-09 | Disposition: A | Discharge status: Home / Self Care | Payer: BC Managed Care – PPO | Source: Ambulatory Visit | Attending: Urology | Admitting: Urology

## 2020-10-09 ENCOUNTER — Encounter (HOSPITAL_COMMUNITY): Payer: Self-pay

## 2020-10-09 ENCOUNTER — Encounter (HOSPITAL_COMMUNITY): Payer: Self-pay | Admitting: Physician Assistant

## 2020-10-09 ENCOUNTER — Other Ambulatory Visit: Payer: Self-pay

## 2020-10-09 ENCOUNTER — Other Ambulatory Visit (HOSPITAL_COMMUNITY)
Admission: RE | Admit: 2020-10-09 | Discharge: 2020-10-09 | Disposition: A | Discharge status: Home / Self Care | Payer: BC Managed Care – PPO | Source: Ambulatory Visit | Attending: Urology | Admitting: Urology

## 2020-10-09 DIAGNOSIS — Z20822 Contact with and (suspected) exposure to covid-19: Secondary | ICD-10-CM | POA: Insufficient documentation

## 2020-10-09 DIAGNOSIS — Z01818 Encounter for other preprocedural examination: Secondary | ICD-10-CM | POA: Diagnosis present

## 2020-10-09 LAB — CBC
HCT: 40.9 % (ref 39.0–52.0)
Hemoglobin: 12.8 g/dL — ABNORMAL LOW (ref 13.0–17.0)
MCH: 27.1 pg (ref 26.0–34.0)
MCHC: 31.3 g/dL (ref 30.0–36.0)
MCV: 86.7 fL (ref 80.0–100.0)
Platelets: 313 10*3/uL (ref 150–400)
RBC: 4.72 MIL/uL (ref 4.22–5.81)
RDW: 14.7 % (ref 11.5–15.5)
WBC: 8.1 10*3/uL (ref 4.0–10.5)
nRBC: 0 % (ref 0.0–0.2)

## 2020-10-09 LAB — BASIC METABOLIC PANEL
Anion gap: 6 (ref 5–15)
BUN: 18 mg/dL (ref 6–20)
CO2: 26 mmol/L (ref 22–32)
Calcium: 9 mg/dL (ref 8.9–10.3)
Chloride: 105 mmol/L (ref 98–111)
Creatinine, Ser: 1.04 mg/dL (ref 0.61–1.24)
GFR, Estimated: >60 mL/min (ref >60)
Glucose, Bld: 80 mg/dL (ref 70–99)
Potassium: 4.3 mmol/L (ref 3.5–5.1)
Sodium: 137 mmol/L (ref 135–145)

## 2020-10-09 LAB — SARS CORONAVIRUS 2 (TAT 6-24 HRS): SARS Coronavirus 2: NEGATIVE

## 2020-10-09 NOTE — Progress Notes (Addendum)
Anesthesia Review:  PCP:  DR Felix Pacini- [facility] 09/19/20  Cardiologist : 08/13/2020 - Pulmonary - [facility]  Neurology- DR Juanell Fairly- Telemedicine 06/2019  Chest x-ray : EKG :10/09/20 Final EKG reading shows atrial flutter read by DR Eldridge Dace  Previous EKG from 2020 in epic shows Sinus Rhythm.   Echo : Stress test: Cardiac Cath :  Activity level: can do a flight of stairs without difficulty  Sleep Study/ CPAP :has cpap  Fasting Blood Sugar :      / Checks Blood Sugar -- times a day:   Blood Thinner/ Instructions /Last Dose: ASA / Instructions/ Last Dose :  BMI- 57.14  Has tremors related to Traumatic Brain Injury from 2020  PT also has ADHD

## 2020-10-10 NOTE — Anesthesia Preprocedure Evaluation (Deleted)
Anesthesia Evaluation    Reviewed: Unable to perform ROS - Chart review only  Airway        Dental   Pulmonary former smoker,           Cardiovascular hypertension,      Neuro/Psych    GI/Hepatic   Endo/Other    Renal/GU      Musculoskeletal   Abdominal   Peds  Hematology   Anesthesia Other Findings   Reproductive/Obstetrics                             Anesthesia Physical Anesthesia Plan  ASA:   Anesthesia Plan:    Post-op Pain Management:    Induction:   PONV Risk Score and Plan:   Airway Management Planned:   Additional Equipment:   Intra-op Plan:   Post-operative Plan:   Informed Consent:   Plan Discussed with:   Anesthesia Plan Comments: (See PAT note 10/09/2020, Jodell Cipro, PA-C)        Anesthesia Quick Evaluation
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
## 2020-10-13 ENCOUNTER — Other Ambulatory Visit: Payer: Self-pay | Admitting: Urology

## 2020-10-13 ENCOUNTER — Encounter (HOSPITAL_COMMUNITY): Admission: RE | Payer: Self-pay | Source: Ambulatory Visit

## 2020-10-13 ENCOUNTER — Ambulatory Visit (HOSPITAL_COMMUNITY): Admission: RE | Admit: 2020-10-13 | Payer: BC Managed Care – PPO | Source: Ambulatory Visit | Admitting: Urology

## 2020-10-13 DIAGNOSIS — I4891 Unspecified atrial fibrillation: Secondary | ICD-10-CM

## 2020-10-13 SURGERY — CIRCUMCISION, ADULT
Anesthesia: Choice

## 2020-10-20 ENCOUNTER — Telehealth: Payer: Self-pay

## 2020-10-20 DIAGNOSIS — S069X9S Unspecified intracranial injury with loss of consciousness of unspecified duration, sequela: Secondary | ICD-10-CM

## 2020-10-20 DIAGNOSIS — R251 Tremor, unspecified: Secondary | ICD-10-CM

## 2020-10-20 DIAGNOSIS — F8081 Childhood onset fluency disorder: Secondary | ICD-10-CM

## 2020-10-20 NOTE — Telephone Encounter (Signed)
Notified pt of referral  

## 2020-10-20 NOTE — Telephone Encounter (Addendum)
Pt called to f/u on neurology referral. Stating that it was discussed in the last office visit. No order placed that I can find  Depression with anxiety/TBI Stable Continue Celexa 40 mg daily Continue Abilify 10 mg QD - Ambulatory referral to Psychology has been placed - f/u 5.5 mos sooner if needed.

## 2020-10-20 NOTE — Telephone Encounter (Signed)
He is already established with Dr. Everlena Cooper.  He just needed to call to schedule an appointment with them he technically did not need a new referral.  Sorry for the confusion.  I did go ahead and place the referral today back to Dr. Everlena Cooper to help initiate the process.
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
## 2020-11-12 ENCOUNTER — Encounter: Payer: Self-pay | Admitting: Neurology

## 2020-11-12 ENCOUNTER — Other Ambulatory Visit: Payer: Self-pay

## 2020-11-12 ENCOUNTER — Ambulatory Visit: Payer: BC Managed Care – PPO | Admitting: Neurology

## 2020-11-12 VITALS — BP 132/80 | HR 62 | Ht 73.0 in | Wt >= 6400 oz

## 2020-11-12 DIAGNOSIS — I609 Nontraumatic subarachnoid hemorrhage, unspecified: Secondary | ICD-10-CM | POA: Diagnosis not present

## 2020-11-12 DIAGNOSIS — R41840 Attention and concentration deficit: Secondary | ICD-10-CM | POA: Diagnosis not present

## 2020-11-12 DIAGNOSIS — S069X9S Unspecified intracranial injury with loss of consciousness of unspecified duration, sequela: Secondary | ICD-10-CM

## 2020-11-12 DIAGNOSIS — G969 Disorder of central nervous system, unspecified: Secondary | ICD-10-CM

## 2020-11-12 DIAGNOSIS — R5382 Chronic fatigue, unspecified: Secondary | ICD-10-CM

## 2020-11-12 DIAGNOSIS — R251 Tremor, unspecified: Secondary | ICD-10-CM

## 2020-11-12 NOTE — Patient Instructions (Signed)
Unfortunately, medications do not respond well to tremor related to brain injury.  We can try propranolol, but since you have yet to be evaluated by cardiology for a fib, I do not want to start such medications.  You have already undergone cognitive rehab last year.  As per Dr. Rachel Bo, you would probably benefit seeing a clinical therapist (such as a psychologist or social worker) who performs modalities such as cognitive behavioral therapy.  I will refer you to [facility]  Discuss with Dr. Claiborne Billings about another referral to psychiatry.    Headaches occur about twice a month, which is improved and not frequent.  I wouldn't start/add another medication for this.  Regarding fatigue - given history of ADHD, may discuss starting a stimulant with Dr. Claiborne Billings, however I am not sure if it would aggravate the tremor.  Again, seeing a psychiatrist may be beneficial

## 2020-11-13 NOTE — Addendum Note (Signed)
Addended by: Leida Lauth on: 11/13/2020 08:23 AM   Modules accepted: Orders

## 2020-11-17 ENCOUNTER — Ambulatory Visit: Payer: BC Managed Care – PPO | Admitting: Primary Care

## 2020-11-17 ENCOUNTER — Ambulatory Visit (INDEPENDENT_AMBULATORY_CARE_PROVIDER_SITE_OTHER): Payer: BC Managed Care – PPO

## 2020-11-17 ENCOUNTER — Other Ambulatory Visit: Payer: Self-pay | Admitting: Internal Medicine

## 2020-11-17 ENCOUNTER — Ambulatory Visit: Payer: BC Managed Care – PPO | Admitting: Internal Medicine

## 2020-11-17 ENCOUNTER — Other Ambulatory Visit: Payer: Self-pay

## 2020-11-17 ENCOUNTER — Encounter: Payer: Self-pay | Admitting: Internal Medicine

## 2020-11-17 VITALS — BP 126/78 | HR 83 | Ht 73.0 in | Wt >= 6400 oz

## 2020-11-17 DIAGNOSIS — I4891 Unspecified atrial fibrillation: Secondary | ICD-10-CM

## 2020-11-17 DIAGNOSIS — Z8679 Personal history of other diseases of the circulatory system: Secondary | ICD-10-CM | POA: Diagnosis not present
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
## 2020-11-17 NOTE — Patient Instructions (Signed)
Medication Instructions:  Your physician recommends that you continue on your current medications as directed. Please refer to the Current Medication list given to you today.  *If you need a refill on your cardiac medications before your next appointment, please call your pharmacy*   Lab Work: NONE   If you have labs (blood work) drawn today and your tests are completely normal, you will receive your results only by: Marland Kitchen MyChart Message (if you have MyChart) OR . A paper copy in the mail If you have any lab test that is abnormal or we need to change your treatment, we will call you to review the results.   Testing/Procedures: Your physician has requested that you have an echocardiogram. Echocardiography is a painless test that uses sound waves to create images of your heart. It provides your doctor with information about the size and shape of your heart and how well your heart's chambers and valves are working. This procedure takes approximately one hour. There are no restrictions for this procedure.  ZIO XT- Long Term Monitor Instructions   Your physician has requested you wear your ZIO patch monitor___2____days.   This is a single patch monitor.  [employer] supplies one patch monitor per enrollment.  Additional stickers are not available.   Please do not apply patch if you will be having a Nuclear Stress Test, Echocardiogram, Cardiac CT, MRI, or Chest Xray during the time frame you would be wearing the monitor. The patch cannot be worn during these tests.  You cannot remove and re-apply the ZIO XT patch monitor.   Your ZIO patch monitor will be sent USPS Priority mail from [facility] directly to your home address. The monitor may also be mailed to a PO BOX if home delivery is not available.   It may take 3-5 days to receive your monitor after you have been enrolled.   Once you have received you monitor, please review enclosed instructions.  Your monitor has already been registered  assigning a specific monitor serial # to you.   Applying the monitor   Shave hair from upper left chest.   Hold abrader disc by orange tab.  Rub abrader in 40 strokes over left upper chest as indicated in your monitor instructions.   Clean area with 4 enclosed alcohol pads .  Use all pads to assure are is cleaned thoroughly.  Let dry.   Apply patch as indicated in monitor instructions.  Patch will be place under collarbone on left side of chest with arrow pointing upward.   Rub patch adhesive wings for 2 minutes.Remove white label marked "1".  Remove white label marked "2".  Rub patch adhesive wings for 2 additional minutes.   While looking in a mirror, press and release button in center of patch.  A small green light will flash 3-4 times .  This will be your only indicator the monitor has been turned on.     Do not shower for the first 24 hours.  You may shower after the first 24 hours.   Press button if you feel a symptom. You will hear a small click.  Record Date, Time and Symptom in the Patient Log Book.   When you are ready to remove patch, follow instructions on last 2 pages of Patient Log Book.  Stick patch monitor onto last page of Patient Log Book.   Place Patient Log Book in [place] box.  Use locking tab on box and tape box closed securely.  The [place] and  White box has [employer] on it.  Please place in mailbox as soon as possible.  Your physician should have your test results approximately 7 days after the monitor has been mailed back to [facility]   Call [place] at [phone] if you have questions regarding your ZIO XT patch monitor.  Call them immediately if you see an orange light blinking on your monitor.   If your monitor falls off in less than 4 days contact our Monitor department at [phone].  If your monitor becomes loose or falls off after 4 days call [employer] at [phone] for suggestions on securing your monitor.    Follow-Up: At [facility], you and your health needs are our priority.  As part of our continuing mission to provide you with exceptional heart care, we have created designated Provider Care Teams.  These Care Teams include your primary Cardiologist (physician) and Advanced Practice Providers (APPs -  Physician Assistants and Nurse Practitioners) who all work together to provide you with the care you need, when you need it.  We recommend signing up for the patient portal called "MyChart".  Sign up information is provided on this After Visit Summary.  MyChart is used to connect with patients for Virtual Visits (Telemedicine).  Patients are able to view lab/test results, encounter notes, upcoming appointments, etc.  Non-urgent messages can be sent to your provider as well.   To learn more about what you can do with MyChart, go to [email or web address].    Your next appointment:    Pending test results   The format for your next appointment:   In Person  Provider:   Dorris Carnes, MD   Other Instructions Thank you for choosing [facility]!

## 2020-11-18 ENCOUNTER — Ambulatory Visit: Payer: BC Managed Care – PPO | Admitting: Urology

## 2020-11-18 ENCOUNTER — Encounter: Payer: Self-pay | Admitting: Urology

## 2020-11-18 VITALS — BP 145/84 | HR 96 | Ht 73.0 in | Wt >= 6400 oz

## 2020-11-18 DIAGNOSIS — R3912 Poor urinary stream: Secondary | ICD-10-CM | POA: Diagnosis not present

## 2020-11-18 DIAGNOSIS — N471 Phimosis: Secondary | ICD-10-CM | POA: Diagnosis not present

## 2020-11-18 DIAGNOSIS — N4883 Acquired buried penis: Secondary | ICD-10-CM
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
## 2020-11-19 DIAGNOSIS — I4891 Unspecified atrial fibrillation: Secondary | ICD-10-CM | POA: Diagnosis not present

## 2020-11-25 ENCOUNTER — Telehealth: Payer: Self-pay | Admitting: Internal Medicine

## 2020-11-25 ENCOUNTER — Other Ambulatory Visit: Payer: Self-pay | Admitting: Urology

## 2020-11-25 NOTE — Telephone Encounter (Signed)
   [facility] Pre-operative Risk Assessment    Patient Name: Bobby [name]  DOB: 13-Feb-1976  MRN: [id number]   HEARTCARE STAFF: - Please ensure there is not already an duplicate clearance open for this procedure. - Under Visit Info/Reason for Call, type in Other and utilize the format Clearance MM/DD/YY or Clearance TBD. Do not use dashes or single digits. - If request is for dental extraction, please clarify the # of teeth to be extracted. - If the patient is currently at the dentist's office, call Pre-Op APP to address. If the patient is not currently in the dentist office, please route to the Pre-Op pool  Request for surgical clearance:  What type of surgery is being performed? Circumcision   When is this surgery scheduled? December 29, 2020  What type of clearance is required (medical clearance vs. Pharmacy clearance to hold med vs. Both)? Medical  Are there any medications that need to be held prior to surgery and how long? No medications  Practice name and name of physician performing surgery? Urology, Dr. Diona Fanti  What is the office phone number? [phone] ext [phone]   7.   What is the office fax number?  [phone]  8.   Anesthesia type (None, local, MAC, general) ? Choice    Shon Millet 11/25/2020, 10:08 AM  _________________________________________________________________   (provider comments below)

## 2020-12-01 ENCOUNTER — Ambulatory Visit (HOSPITAL_COMMUNITY)
Admission: RE | Admit: 2020-12-01 | Discharge: 2020-12-01 | Disposition: A | Discharge status: Home / Self Care | Payer: BC Managed Care – PPO | Source: Ambulatory Visit | Attending: Internal Medicine | Admitting: Internal Medicine

## 2020-12-01 ENCOUNTER — Other Ambulatory Visit: Payer: Self-pay

## 2020-12-01 DIAGNOSIS — Z8679 Personal history of other diseases of the circulatory system: Secondary | ICD-10-CM | POA: Diagnosis present

## 2020-12-01 DIAGNOSIS — I4891 Unspecified atrial fibrillation: Secondary | ICD-10-CM | POA: Diagnosis not present

## 2020-12-01 LAB — ECHOCARDIOGRAM COMPLETE
Area-P 1/2: 3.05 cm2
S' Lateral: 3.7 cm

## 2020-12-01 NOTE — Progress Notes (Signed)
*  PRELIMINARY RESULTS* Echocardiogram 2D Echocardiogram has been performed. Could not obtain an I.V. due to patient being a very difficult stick. Nurse tried and [facility] Supervisor tried.  Stacey Drain 12/01/2020, 4:14 PM

## 2020-12-10 ENCOUNTER — Ambulatory Visit (INDEPENDENT_AMBULATORY_CARE_PROVIDER_SITE_OTHER): Payer: BC Managed Care – PPO | Admitting: Psychologist

## 2020-12-10 DIAGNOSIS — F411 Generalized anxiety disorder: Secondary | ICD-10-CM | POA: Diagnosis not present

## 2020-12-10 DIAGNOSIS — F320 Major depressive disorder, single episode, mild: Secondary | ICD-10-CM

## 2020-12-11 NOTE — Patient Instructions (Addendum)
DUE TO COVID-19 ONLY ONE VISITOR IS ALLOWED TO COME WITH YOU AND STAY IN THE WAITING ROOM ONLY DURING PRE OP AND PROCEDURE.   **NO VISITORS ARE ALLOWED IN THE SHORT STAY AREA OR RECOVERY ROOM!!**        Your procedure is scheduled on: 12/29/20    Report to [facility] Main  Entrance   Report to Short Stay at 5:15 AM   [facility])   Call this number if you have problems the morning of surgery [phone]   Do not eat food :After Midnight.   May have liquids until 4:30 AM day of surgery  CLEAR LIQUID DIET  Foods Allowed                                                                     Foods Excluded  Water, Black Coffee and tea, regular and decaf               liquids that you cannot  Plain Jell-O in any flavor  (No red)                                     see through such as: Fruit ices (not with fruit pulp)                                            milk, soups, orange juice              Iced Popsicles (No red)                                                All solid food                                   Apple juices Sports drinks like Gatorade (No red) Lightly seasoned clear broth or consume(fat free) Sugar, honey syrup      Oral Hygiene is also important to reduce your risk of infection.                                    Remember - BRUSH YOUR TEETH THE MORNING OF SURGERY WITH YOUR REGULAR TOOTHPASTE   Take these medicines the morning of surgery with A SIP OF WATER: Albuterol inhaler if needed                              You may not have any metal on your body including hair pins, jewelry, and body piercing             Do not wear lotions, powders, cologne, or deodorant              Men may shave face and neck.  Do not bring valuables to the hospital. [facility] IS NOT             RESPONSIBLE   FOR VALUABLES.   Contacts, dentures or bridgework may not be worn into surgery.    Patients discharged the day of surgery will not be allowed to drive  home.              Please read over the following fact sheets you were given: IF YOU HAVE QUESTIONS ABOUT YOUR PRE OP INSTRUCTIONS PLEASE CALL [phone]   [facility] - Preparing for Surgery Before surgery, you can play an important role.  Because skin is not sterile, your skin needs to be as free of germs as possible.  You can reduce the number of germs on your skin by washing with CHG (chlorahexidine gluconate) soap before surgery.  CHG is an antiseptic cleaner which kills germs and bonds with the skin to continue killing germs even after washing. Please DO NOT use if you have an allergy to CHG or antibacterial soaps.  If your skin becomes reddened/irritated stop using the CHG and inform your nurse when you arrive at Short Stay. Do not shave (including legs and underarms) for at least 48 hours prior to the first CHG shower.  You may shave your face/neck.  Please follow these instructions carefully:  1.  Shower with CHG Soap the night before surgery and the  morning of surgery.  2.  If you choose to wash your hair, wash your hair first as usual with your normal  shampoo.  3.  After you shampoo, rinse your hair and body thoroughly to remove the shampoo.                             4.  Use CHG as you would any other liquid soap.  You can apply chg directly to the skin and wash.  Gently with a scrungie or clean washcloth.  5.  Apply the CHG Soap to your body ONLY FROM THE NECK DOWN.   Do   not use on face/ open                           Wound or open sores. Avoid contact with eyes, ears mouth and   genitals (private parts).                       Wash face,  Genitals (private parts) with your normal soap.             6.  Wash thoroughly, paying special attention to the area where your    surgery  will be performed.  7.  Thoroughly rinse your body with warm water from the neck down.  8.  DO NOT shower/wash with your normal soap after using and rinsing off the CHG Soap.                9.  Pat  yourself dry with a clean towel.            10.  Wear clean pajamas.            11.  Place clean sheets on your bed the night of your first shower and do not  sleep with pets. Day of Surgery : Do not apply any lotions/deodorants the morning of surgery.  Please wear clean clothes to the hospital/surgery center.  FAILURE TO FOLLOW THESE INSTRUCTIONS MAY RESULT IN THE CANCELLATION OF YOUR SURGERY  PATIENT SIGNATURE_________________________________  NURSE SIGNATURE__________________________________  ________________________________________________________________________

## 2020-12-11 NOTE — Progress Notes (Addendum)
COVID Vaccine Completed: Yes x3 Date COVID Vaccine completed: 08/30/19, 10/08/19, 06/11/20 Has received booster:yes x1 COVID vaccine manufacturer: Pfizer     Date of COVID positive in last 90 days: N/A  PCP - Felix Pacini, DO Cardiologist - Dietrich Pates, MD  Chest x-ray - 10/16/18 Epic EKG - 11/17/20 Epic Stress Test - years ago per pt ECHO - 12/01/20 Epic Cardiac Cath - N/A Pacemaker/ICD device last checked: N/a Spinal Cord Stimulator: N/A Holter monitor 11/28/20 Epic  Cardiac clearance in note by Dietrich Pates dated 11/17/20 in Epic.  Sleep Study - Yes pos sleep apnea CPAP - yes uses every night  Fasting Blood Sugar - N/A Checks Blood Sugar _____ times a day  Blood Thinner Instructions: N/A Aspirin Instructions: Last Dose:  Activity level: Can perform activities of daily living without stopping and without symptoms of chest pain or shortness of breath. Gets SOB with activity, pt reports due to being out of shape.    Anesthesia review: HTN, OSA, a flutter, a fib  Patient denies shortness of breath, fever, cough and chest pain at PAT appointment   Patient verbalized understanding of instructions that were given to them at the PAT appointment. Patient was also instructed that they will need to review over the PAT instructions again at home before surgery.

## 2020-12-12 ENCOUNTER — Encounter (HOSPITAL_COMMUNITY): Payer: Self-pay

## 2020-12-12 ENCOUNTER — Other Ambulatory Visit: Payer: Self-pay

## 2020-12-12 ENCOUNTER — Encounter (HOSPITAL_COMMUNITY)
Admission: RE | Admit: 2020-12-12 | Discharge: 2020-12-12 | Disposition: A | Discharge status: Home / Self Care | Payer: BC Managed Care – PPO | Source: Ambulatory Visit | Attending: Urology | Admitting: Urology

## 2020-12-12 DIAGNOSIS — Z01812 Encounter for preprocedural laboratory examination: Secondary | ICD-10-CM | POA: Insufficient documentation

## 2020-12-12 HISTORY — DX: Personal history of urinary calculi: Z87.442

## 2020-12-12 HISTORY — DX: Dyspnea, unspecified: R06.00

## 2020-12-12 LAB — BASIC METABOLIC PANEL
Anion gap: 6 (ref 5–15)
BUN: 16 mg/dL (ref 6–20)
CO2: 28 mmol/L (ref 22–32)
Calcium: 9 mg/dL (ref 8.9–10.3)
Chloride: 103 mmol/L (ref 98–111)
Creatinine, Ser: 1.09 mg/dL (ref 0.61–1.24)
GFR, Estimated: >60 mL/min (ref >60)
Glucose, Bld: 94 mg/dL (ref 70–99)
Potassium: 4.5 mmol/L (ref 3.5–5.1)
Sodium: 137 mmol/L (ref 135–145)

## 2020-12-12 LAB — CBC
HCT: 37.7 % — ABNORMAL LOW (ref 39.0–52.0)
Hemoglobin: 12 g/dL — ABNORMAL LOW (ref 13.0–17.0)
MCH: 27.3 pg (ref 26.0–34.0)
MCHC: 31.8 g/dL (ref 30.0–36.0)
MCV: 85.9 fL (ref 80.0–100.0)
Platelets: 269 10*3/uL (ref 150–400)
RBC: 4.39 MIL/uL (ref 4.22–5.81)
RDW: 14.9 % (ref 11.5–15.5)
WBC: 7.1 10*3/uL (ref 4.0–10.5)
nRBC: 0 % (ref 0.0–0.2)

## 2020-12-18 ENCOUNTER — Ambulatory Visit: Payer: BC Managed Care – PPO | Admitting: Psychologist
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
## 2020-12-18 NOTE — Anesthesia Preprocedure Evaluation (Addendum)
Anesthesia Evaluation  Patient identified by MRN, date of birth, ID band Patient awake    Reviewed: Allergy & Precautions, NPO status , Patient's Chart, lab work & pertinent test results  Airway Mallampati: III  TM Distance: >3 FB Neck ROM: Full    Dental no notable dental hx.    Pulmonary sleep apnea and Continuous Positive Airway Pressure Ventilation , former smoker,    Pulmonary exam normal breath sounds clear to auscultation       Cardiovascular negative cardio ROS Normal cardiovascular exam Rhythm:Regular Rate:Normal     Neuro/Psych  Headaches, PSYCHIATRIC DISORDERS Anxiety Depression TBI (traumatic brain injury) Tremor    GI/Hepatic negative GI ROS, Neg liver ROS,   Endo/Other  Morbid obesity (SUPER  )  Renal/GU negative Renal ROS     Musculoskeletal negative musculoskeletal ROS (+) Arthritis ,   Abdominal (+) + obese,   Peds  (+) ADHD Hematology negative hematology ROS (+) HLD   Anesthesia Other Findings PHIMOSIS  Reproductive/Obstetrics                           Anesthesia Physical Anesthesia Plan  ASA: 4  Anesthesia Plan: MAC   Post-op Pain Management:    Induction: Intravenous  PONV Risk Score and Plan: 1 and Ondansetron, Dexamethasone, Midazolam and Treatment may vary due to age or medical condition  Airway Management Planned: Simple Face Mask  Additional Equipment:   Intra-op Plan:   Post-operative Plan:   Informed Consent: I have reviewed the patients History and Physical, chart, labs and discussed the procedure including the risks, benefits and alternatives for the proposed anesthesia with the patient or authorized representative who has indicated his/her understanding and acceptance.     Dental advisory given  Plan Discussed with: CRNA  Anesthesia Plan Comments: (Reviewed APP note by Durel Salts, FNP )      Anesthesia Quick Evaluation

## 2020-12-19 ENCOUNTER — Ambulatory Visit (INDEPENDENT_AMBULATORY_CARE_PROVIDER_SITE_OTHER): Payer: BC Managed Care – PPO | Admitting: Psychologist

## 2020-12-19 ENCOUNTER — Ambulatory Visit (INDEPENDENT_AMBULATORY_CARE_PROVIDER_SITE_OTHER): Payer: BC Managed Care – PPO | Admitting: Psychology

## 2020-12-19 DIAGNOSIS — F89 Unspecified disorder of psychological development: Secondary | ICD-10-CM | POA: Diagnosis not present

## 2020-12-19 DIAGNOSIS — F411 Generalized anxiety disorder: Secondary | ICD-10-CM

## 2020-12-19 DIAGNOSIS — F320 Major depressive disorder, single episode, mild: Secondary | ICD-10-CM

## 2020-12-28 ENCOUNTER — Encounter (HOSPITAL_COMMUNITY): Payer: Self-pay | Admitting: Urology

## 2020-12-28 MED ORDER — CEFAZOLIN IN SODIUM CHLORIDE 3-0.9 GM/100ML-% IV SOLN
3.0000 g | INTRAVENOUS | Status: AC
  Administered 2020-12-29: 2 g via INTRAVENOUS
  Filled 2020-12-28: qty 100

## 2020-12-29 ENCOUNTER — Encounter (HOSPITAL_COMMUNITY)
Admission: RE | Disposition: A | Discharge status: Home / Self Care | Payer: Self-pay | Source: Ambulatory Visit | Attending: Urology

## 2020-12-29 ENCOUNTER — Ambulatory Visit (HOSPITAL_COMMUNITY)
Admission: RE | Admit: 2020-12-29 | Discharge: 2020-12-29 | Disposition: A | Discharge status: Home / Self Care | Payer: BC Managed Care – PPO | Source: Ambulatory Visit | Attending: Urology | Admitting: Urology

## 2020-12-29 ENCOUNTER — Ambulatory Visit (HOSPITAL_COMMUNITY): Payer: BC Managed Care – PPO | Admitting: Anesthesiology

## 2020-12-29 ENCOUNTER — Ambulatory Visit (HOSPITAL_COMMUNITY): Payer: BC Managed Care – PPO | Admitting: Emergency Medicine

## 2020-12-29 ENCOUNTER — Encounter (HOSPITAL_COMMUNITY): Payer: Self-pay | Admitting: Urology

## 2020-12-29 DIAGNOSIS — Z888 Allergy status to other drugs, medicaments and biological substances status: Secondary | ICD-10-CM | POA: Insufficient documentation

## 2020-12-29 DIAGNOSIS — Z6841 Body Mass Index (BMI) 40.0 and over, adult: Secondary | ICD-10-CM | POA: Diagnosis not present

## 2020-12-29 DIAGNOSIS — Q5564 Hidden penis: Secondary | ICD-10-CM | POA: Insufficient documentation

## 2020-12-29 DIAGNOSIS — Z87891 Personal history of nicotine dependence: Secondary | ICD-10-CM | POA: Insufficient documentation

## 2020-12-29 DIAGNOSIS — Z8349 Family history of other endocrine, nutritional and metabolic diseases: Secondary | ICD-10-CM | POA: Diagnosis not present

## 2020-12-29 DIAGNOSIS — Z87820 Personal history of traumatic brain injury: Secondary | ICD-10-CM | POA: Insufficient documentation

## 2020-12-29 DIAGNOSIS — N471 Phimosis: Secondary | ICD-10-CM | POA: Insufficient documentation

## 2020-12-29 DIAGNOSIS — R062 Wheezing: Secondary | ICD-10-CM

## 2020-12-29 HISTORY — PX: CIRCUMCISION: SHX1350

## 2020-12-29 SURGERY — CIRCUMCISION, ADULT
Anesthesia: Monitor Anesthesia Care

## 2020-12-29 MED ORDER — ACETAMINOPHEN 500 MG PO TABS
1000.0000 mg | ORAL_TABLET | Freq: Once | ORAL | Status: AC
  Administered 2020-12-29: 1000 mg via ORAL
  Filled 2020-12-29: qty 2

## 2020-12-29 MED ORDER — LIDOCAINE 2% (20 MG/ML) 5 ML SYRINGE
INTRAMUSCULAR | Status: AC
  Filled 2020-12-29: qty 15

## 2020-12-29 MED ORDER — LACTATED RINGERS IV SOLN: INTRAVENOUS | Status: DC

## 2020-12-29 MED ORDER — PROPOFOL 1000 MG/100ML IV EMUL
INTRAVENOUS | Status: AC
  Filled 2020-12-29: qty 200

## 2020-12-29 MED ORDER — KETOROLAC TROMETHAMINE 30 MG/ML IJ SOLN
30.0000 mg | Freq: Once | INTRAMUSCULAR | Status: DC | PRN
Start: 2020-12-29 — End: 2020-12-29

## 2020-12-29 MED ORDER — DEXAMETHASONE SODIUM PHOSPHATE 10 MG/ML IJ SOLN
INTRAMUSCULAR | Status: AC
  Filled 2020-12-29: qty 2

## 2020-12-29 MED ORDER — CHLORHEXIDINE GLUCONATE 0.12 % MT SOLN
15.0000 mL | Freq: Once | OROMUCOSAL | Status: AC
  Administered 2020-12-29: 15 mL via OROMUCOSAL

## 2020-12-29 MED ORDER — PROPOFOL 500 MG/50ML IV EMUL
INTRAVENOUS | Status: DC | PRN
  Administered 2020-12-29: 25 ug/kg/min via INTRAVENOUS

## 2020-12-29 MED ORDER — TRAMADOL HCL 50 MG PO TABS: 50.0000 mg | ORAL_TABLET | Freq: Four times a day (QID) | ORAL | 0 refills | Status: DC | PRN

## 2020-12-29 MED ORDER — LIDOCAINE HCL (PF) 1 % IJ SOLN
INTRAMUSCULAR | Status: DC | PRN
  Administered 2020-12-29: 30 mL

## 2020-12-29 MED ORDER — SODIUM CHLORIDE 0.9 % IV SOLN: 2.0000 g | INTRAVENOUS | Status: DC

## 2020-12-29 MED ORDER — OXYCODONE HCL 5 MG/5ML PO SOLN: 5.0000 mg | Freq: Once | ORAL | Status: DC | PRN

## 2020-12-29 MED ORDER — BUPIVACAINE HCL (PF) 0.25 % IJ SOLN
INTRAMUSCULAR | Status: DC | PRN
  Administered 2020-12-29: 50 mL

## 2020-12-29 MED ORDER — 0.9 % SODIUM CHLORIDE (POUR BTL) OPTIME
TOPICAL | Status: DC | PRN
  Administered 2020-12-29: 1000 mL

## 2020-12-29 MED ORDER — MIDAZOLAM HCL 2 MG/2ML IJ SOLN
INTRAMUSCULAR | Status: AC
  Filled 2020-12-29: qty 2

## 2020-12-29 MED ORDER — OXYCODONE HCL 5 MG PO TABS: 5.0000 mg | ORAL_TABLET | Freq: Once | ORAL | Status: DC | PRN

## 2020-12-29 MED ORDER — FENTANYL CITRATE (PF) 100 MCG/2ML IJ SOLN: 25.0000 ug | INTRAMUSCULAR | Status: DC | PRN

## 2020-12-29 MED ORDER — PROMETHAZINE HCL 25 MG/ML IJ SOLN: 6.2500 mg | INTRAMUSCULAR | Status: DC | PRN

## 2020-12-29 MED ORDER — BUPIVACAINE HCL 0.25 % IJ SOLN
INTRAMUSCULAR | Status: AC
  Filled 2020-12-29: qty 1

## 2020-12-29 MED ORDER — MIDAZOLAM HCL 5 MG/5ML IJ SOLN
INTRAMUSCULAR | Status: DC | PRN
  Administered 2020-12-29: 2 mg via INTRAVENOUS

## 2020-12-29 MED ORDER — AMISULPRIDE (ANTIEMETIC) 5 MG/2ML IV SOLN: 10.0000 mg | Freq: Once | INTRAVENOUS | Status: DC | PRN

## 2020-12-29 MED ORDER — LIDOCAINE HCL (PF) 1 % IJ SOLN
INTRAMUSCULAR | Status: AC
  Filled 2020-12-29: qty 30

## 2020-12-29 MED ORDER — FENTANYL CITRATE (PF) 100 MCG/2ML IJ SOLN
INTRAMUSCULAR | Status: DC | PRN
  Administered 2020-12-29: 25 ug via INTRAVENOUS
  Administered 2020-12-29: 50 ug via INTRAVENOUS
  Administered 2020-12-29: 25 ug via INTRAVENOUS

## 2020-12-29 MED ORDER — CHLORHEXIDINE GLUCONATE 0.12 % MT SOLN: 15.0000 mL | Freq: Once | OROMUCOSAL | Status: DC

## 2020-12-29 MED ORDER — ORAL CARE MOUTH RINSE: 15.0000 mL | Freq: Once | OROMUCOSAL | Status: DC

## 2020-12-29 MED ORDER — ONDANSETRON HCL 4 MG/2ML IJ SOLN
INTRAMUSCULAR | Status: AC
  Filled 2020-12-29: qty 4

## 2020-12-29 MED ORDER — PROPOFOL 500 MG/50ML IV EMUL
INTRAVENOUS | Status: AC
  Filled 2020-12-29: qty 50

## 2020-12-29 MED ORDER — ORAL CARE MOUTH RINSE: 15.0000 mL | Freq: Once | OROMUCOSAL | Status: AC

## 2020-12-29 MED ORDER — FENTANYL CITRATE (PF) 100 MCG/2ML IJ SOLN
INTRAMUSCULAR | Status: AC
  Filled 2020-12-29: qty 2

## 2020-12-29 SURGICAL SUPPLY — 26 items
BAG COUNTER SPONGE SURGICOUNT (BAG) IMPLANT
BAG SPNG CNTER NS LX DISP (BAG)
BLADE SURG 15 STRL LF DISP TIS (BLADE) ×1 IMPLANT
BLADE SURG 15 STRL SS (BLADE) ×2
BNDG COHESIVE 1X5 TAN STRL LF (GAUZE/BANDAGES/DRESSINGS) ×2 IMPLANT
BNDG CONFORM 2 STRL LF (GAUZE/BANDAGES/DRESSINGS) ×2 IMPLANT
BNDG CONFORM 4 STRL LF (GAUZE/BANDAGES/DRESSINGS) ×1 IMPLANT
BNDG GAUZE ELAST 4 BULKY (GAUZE/BANDAGES/DRESSINGS) ×2 IMPLANT
COVER SURGICAL LIGHT HANDLE (MISCELLANEOUS) ×2 IMPLANT
DRAPE LAPAROTOMY T 102X78X121 (DRAPES) ×2 IMPLANT
ELECT NDL TIP 2.8 STRL (NEEDLE) IMPLANT
ELECT NEEDLE TIP 2.8 STRL (NEEDLE) ×2 IMPLANT
ELECT REM PT RETURN 15FT ADLT (MISCELLANEOUS) ×2 IMPLANT
GAUZE 4X4 16PLY ~~LOC~~+RFID DBL (SPONGE) ×2 IMPLANT
GAUZE PETROLATUM 1 X8 (GAUZE/BANDAGES/DRESSINGS) ×2 IMPLANT
GAUZE SPONGE 4X4 12PLY STRL (GAUZE/BANDAGES/DRESSINGS) ×1 IMPLANT
GLOVE SURG ENC TEXT LTX SZ8 (GLOVE) ×2 IMPLANT
GOWN STRL REUS W/TWL XL LVL3 (GOWN DISPOSABLE) ×2 IMPLANT
KIT BASIN OR (CUSTOM PROCEDURE TRAY) ×2 IMPLANT
KIT TURNOVER KIT A (KITS) ×2 IMPLANT
NEEDLE HYPO 22GX1.5 SAFETY (NEEDLE) ×2 IMPLANT
NS IRRIG 1000ML POUR BTL (IV SOLUTION) ×1 IMPLANT
PACK BASIC VI WITH GOWN DISP (CUSTOM PROCEDURE TRAY) ×2 IMPLANT
PENCIL SMOKE EVACUATOR (MISCELLANEOUS) ×2 IMPLANT
SUT CHROMIC 4 0 RB 1X27 (SUTURE) ×4 IMPLANT
SYR CONTROL 10ML LL (SYRINGE) ×1 IMPLANT

## 2020-12-29 NOTE — Transfer of Care (Signed)
Immediate Anesthesia Transfer of Care Note  Patient: Bobby [name]  Procedure(s) Performed: Procedure(s) with comments: DORSAL SLIT CIRCUMCISION ADULT (N/A) - 30 MINS  Patient Location: PACU  Anesthesia Type:MAC  Level of Consciousness:  sedated, patient cooperative and responds to stimulation  Airway & Oxygen Therapy:Patient Spontanous Breathing and Patient connected to face mask oxgen  Post-op Assessment:  Report given to PACU RN and Post -op Vital signs reviewed and stable  Post vital signs:  Reviewed and stable  Last Vitals:  Vitals:   12/29/20 0628  BP: (!) 154/85  Pulse: 65  Resp: 19  Temp: 37 C  SpO2: 973%    Complications: No apparent anesthesia complications

## 2020-12-29 NOTE — Op Note (Signed)
Preop diagnosis: Phimosis  Postoperative diagnosis: Same  Principal procedure: Dorsal slit circumcision  Surgeon: Myrlene Riera  Anesthesia: Monitored anesthesia care, local anesthesia with lidocaine/Marcaine  Specimen: None  Estimated blood loss: Less than 10 mL  Complications: None  Indications: 45 year old male, morbidly obese with significant, symptomatic phimosis.  This does affect his voiding and is an issue with hygiene.  He presented to me in the spring of this year.  Initial procedure was canceled due to medical issues.  He presents at this time for dorsal slit circumcision.  I have discussed this versus a sleeve circumcision with the patient.  I feel that the dorsal slit would be better due to his having a significantly buried penis and the risk of further phimosis after sleeve circ.  He agrees with this and desires to proceed.  I discussed the procedure with him, risks, complications and expected outcomes.  He desires to proceed.  Procedure: The patient was properly identified in the holding area.  He was taken the operating room where he was sedated with MAC.  His genitalia and perineum were then prepped, draped, and proper timeout performed.  I then infiltrated the dorsal foreskin with a 50-50 solution of quarter percent plain Marcaine and 1% plain lidocaine.  Eventually, a dorsal penile block was performed as well.  In total, 23 mL of the solution was utilized.  Crushing clamps were then placed side-by-side along the dorsal foreskin.  Incision was made between these.  They were then advanced up the foreskin and again they were clamped side-by-side, another incision was made.  Following this, the clamps were removed.  2 suture lines were carried out, the right and left incisions.  4-0 chromic was placed in a running simple fashion.  In this way, the incisions were closed and adequate hemostasis was achieved.  Following this, the wounds were inspected and no further suture needed to be  placed.  The patient's penis was buried and I could not place a circumferential dressing.  However, fluffs were placed and binding underwear as well.  Procedure was then completed.  The patient was then awakened and taken to the PACU in stable condition, having tolerated the procedure well

## 2020-12-29 NOTE — Interval H&P Note (Signed)
History and Physical Interval Note:  12/29/2020 7:27 AM  Bobby [name]  has presented today for surgery, with the diagnosis of PHIMOSIS.  The various methods of treatment have been discussed with the patient and family. After consideration of risks, benefits and other options for treatment, the patient has consented to  Procedure(s) with comments: DORSAL SLIT CIRCUMCISION ADULT (N/A) - 30 MINS as a surgical intervention.  The patient's history has been reviewed, patient examined, no change in status, stable for surgery.  I have reviewed the patient's chart and labs.  Questions were answered to the patient's satisfaction.     Bertram Millard Jceon Alverio
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
## 2020-12-29 NOTE — Anesthesia Postprocedure Evaluation (Signed)
Anesthesia Post Note  Patient: Bobby [name]  Procedure(s) Performed: DORSAL SLIT CIRCUMCISION ADULT     Patient location during evaluation: PACU Anesthesia Type: MAC Level of consciousness: awake Pain management: pain level controlled Vital Signs Assessment: post-procedure vital signs reviewed and stable Respiratory status: spontaneous breathing, nonlabored ventilation, respiratory function stable and patient connected to nasal cannula oxygen Cardiovascular status: stable and blood pressure returned to baseline Postop Assessment: no apparent nausea or vomiting Anesthetic complications: no   No notable events documented.  Last Vitals:  Vitals:   12/29/20 0830 12/29/20 0845  BP: 133/89 (!) 149/91  Pulse: (!) 56 (!) 59  Resp: 15 14  Temp: 36.8 C 36.8 C  SpO2: 96% 97%    Last Pain:  Vitals:   12/29/20 0845  TempSrc:   PainSc: 0-No pain                 Sueko Dimichele P Ermias Tomeo

## 2020-12-29 NOTE — Discharge Instructions (Signed)
Keep circumcision site clean and dry  It is OK to shower starting Tuesday but dry area well afterwards  Keep gauze dressing over penis for 2 days  Call office for any significant bleeding or drainage from wound

## 2020-12-30 ENCOUNTER — Ambulatory Visit: Payer: BC Managed Care – PPO | Admitting: Urology

## 2020-12-30 ENCOUNTER — Encounter (HOSPITAL_COMMUNITY): Payer: Self-pay | Admitting: Urology

## 2020-12-30 ENCOUNTER — Other Ambulatory Visit: Payer: Self-pay

## 2020-12-30 ENCOUNTER — Telehealth: Payer: Self-pay

## 2020-12-30 VITALS — BP 138/89 | HR 80

## 2020-12-30 DIAGNOSIS — N4883 Acquired buried penis: Secondary | ICD-10-CM

## 2020-12-30 DIAGNOSIS — N471 Phimosis: Secondary | ICD-10-CM

## 2020-12-30 NOTE — Telephone Encounter (Signed)
Patient called with concerns of post of swelling to penis post circumcision on 07/18  Pt reports penis has area " that has turned white and Im worried no blood flow is going there"  MD will see patient today for concerns.
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
## 2020-12-30 NOTE — Progress Notes (Signed)
Urological Symptom Review  Patient is experiencing the following symptoms: Hard to postpone urination Penile pain (male only)    Review of Systems  Gastrointestinal (upper)  : Negative for upper GI symptoms  Gastrointestinal (lower) : Negative for lower GI symptoms  Constitutional : Fatigue  Skin: Negative for skin symptoms  Eyes: Negative for eye symptoms  Ear/Nose/Throat : Negative for Ear/Nose/Throat symptoms  Hematologic/Lymphatic: Negative for Hematologic/Lymphatic symptoms  Cardiovascular : Negative for cardiovascular symptoms  Respiratory : Negative for respiratory symptoms  Endocrine: Negative for endocrine symptoms  Musculoskeletal: Negative for musculoskeletal symptoms  Neurological: Negative for neurological symptoms  Psychologic: Negative for psychiatric symptoms

## 2021-01-02 ENCOUNTER — Ambulatory Visit: Payer: BC Managed Care – PPO | Admitting: Psychology

## 2021-01-05 ENCOUNTER — Ambulatory Visit: Payer: BC Managed Care – PPO | Admitting: Psychologist

## 2021-01-09 ENCOUNTER — Ambulatory Visit (INDEPENDENT_AMBULATORY_CARE_PROVIDER_SITE_OTHER): Payer: BC Managed Care – PPO | Admitting: Psychologist

## 2021-01-09 DIAGNOSIS — F411 Generalized anxiety disorder: Secondary | ICD-10-CM | POA: Diagnosis not present

## 2021-01-09 DIAGNOSIS — F320 Major depressive disorder, single episode, mild: Secondary | ICD-10-CM

## 2021-01-12 ENCOUNTER — Telehealth: Payer: Self-pay | Admitting: Pulmonary Disease

## 2021-01-12 DIAGNOSIS — G4733 Obstructive sleep apnea (adult) (pediatric): Secondary | ICD-10-CM

## 2021-01-12 NOTE — Telephone Encounter (Signed)
Called and spoke with patient. He stated that [facility] needs another order for the cpap since the one placed in March is now expired. I advised him that I would go ahead and place the order for him.   Nothing further needed at time of call.

## 2021-01-30 ENCOUNTER — Ambulatory Visit: Payer: BC Managed Care – PPO | Admitting: Psychologist
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
## 2021-02-10 ENCOUNTER — Encounter: Payer: Self-pay | Admitting: Urology

## 2021-02-10 ENCOUNTER — Other Ambulatory Visit: Payer: Self-pay

## 2021-02-10 ENCOUNTER — Ambulatory Visit: Payer: BC Managed Care – PPO | Admitting: Urology

## 2021-02-10 VITALS — BP 149/89 | HR 96

## 2021-02-10 DIAGNOSIS — N471 Phimosis: Secondary | ICD-10-CM

## 2021-02-10 DIAGNOSIS — N4883 Acquired buried penis: Secondary | ICD-10-CM | POA: Diagnosis not present

## 2021-02-10 NOTE — Progress Notes (Signed)
Urological Symptom Review  Patient is experiencing the following symptoms: Penile bleeding at post op site per pt   Review of Systems  Gastrointestinal (upper)  : Negative for upper GI symptoms  Gastrointestinal (lower) : Negative for lower GI symptoms  Constitutional : Negative for symptoms  Skin: Negative for skin symptoms  Eyes: Negative for eye symptoms  Ear/Nose/Throat : Negative for Ear/Nose/Throat symptoms  Hematologic/Lymphatic: Negative for Hematologic/Lymphatic symptoms  Cardiovascular : Negative for cardiovascular symptoms  Respiratory : Negative for respiratory symptoms  Endocrine: Negative for endocrine symptoms  Musculoskeletal: Negative for musculoskeletal symptoms  Neurological: Negative for neurological symptoms  Psychologic: Negative for psychiatric symptoms

## 2021-02-11 ENCOUNTER — Ambulatory Visit (INDEPENDENT_AMBULATORY_CARE_PROVIDER_SITE_OTHER): Payer: BC Managed Care – PPO | Admitting: Psychologist

## 2021-02-11 DIAGNOSIS — F411 Generalized anxiety disorder: Secondary | ICD-10-CM | POA: Diagnosis not present

## 2021-02-11 DIAGNOSIS — F320 Major depressive disorder, single episode, mild: Secondary | ICD-10-CM | POA: Diagnosis not present

## 2021-03-20 ENCOUNTER — Ambulatory Visit (INDEPENDENT_AMBULATORY_CARE_PROVIDER_SITE_OTHER): Payer: BC Managed Care – PPO | Admitting: Psychologist

## 2021-03-20 ENCOUNTER — Other Ambulatory Visit: Payer: Self-pay

## 2021-03-20 DIAGNOSIS — F411 Generalized anxiety disorder: Secondary | ICD-10-CM | POA: Diagnosis not present

## 2021-03-20 DIAGNOSIS — F320 Major depressive disorder, single episode, mild: Secondary | ICD-10-CM

## 2021-04-10 ENCOUNTER — Ambulatory Visit: Payer: BC Managed Care – PPO | Admitting: Psychologist

## 2021-04-22 ENCOUNTER — Other Ambulatory Visit: Payer: Self-pay | Admitting: Family Medicine

## 2021-04-24 ENCOUNTER — Ambulatory Visit: Payer: BC Managed Care – PPO | Admitting: Psychologist

## 2021-05-04 ENCOUNTER — Telehealth: Payer: Self-pay | Admitting: Pulmonary Disease

## 2021-05-04 ENCOUNTER — Ambulatory Visit: Payer: BC Managed Care – PPO | Admitting: Pulmonary Disease

## 2021-05-04 NOTE — Telephone Encounter (Signed)
Have attempted to call the pt but there was no answer.  Will try back later.

## 2021-05-05 NOTE — Telephone Encounter (Signed)
I have called the pt and LM on VM.   

## 2021-05-06 ENCOUNTER — Telehealth: Payer: BC Managed Care – PPO | Admitting: Family

## 2021-05-06 DIAGNOSIS — J069 Acute upper respiratory infection, unspecified: Secondary | ICD-10-CM

## 2021-05-06 MED ORDER — FLUTICASONE PROPIONATE 50 MCG/ACT NA SUSP: 2.0000 | Freq: Every day | NASAL | 6 refills | Status: DC

## 2021-05-06 MED ORDER — CETIRIZINE HCL 10 MG PO TABS: 10.0000 mg | ORAL_TABLET | Freq: Every day | ORAL | 11 refills | Status: DC

## 2021-05-06 NOTE — Progress Notes (Signed)
Virtual Visit Consent   [name], you are scheduled for a virtual visit with a [facility] provider today.     Just as with appointments in the office, your consent must be obtained to participate.  Your consent will be active for this visit and any virtual visit you may have with one of our providers in the next 365 days.     If you have a MyChart account, a copy of this consent can be sent to you electronically.  All virtual visits are billed to your insurance company just like a traditional visit in the office.    As this is a virtual visit, video technology does not allow for your provider to perform a traditional examination.  This may limit your provider's ability to fully assess your condition.  If your provider identifies any concerns that need to be evaluated in person or the need to arrange testing (such as labs, EKG, etc.), we will make arrangements to do so.     Although advances in technology are sophisticated, we cannot ensure that it will always work on either your end or our end.  If the connection with a video visit is poor, the visit may have to be switched to a telephone visit.  With either a video or telephone visit, we are not always able to ensure that we have a secure connection.     I need to obtain your verbal consent now.   Are you willing to proceed with your visit today?    LAKAI [name] has provided verbal consent on 05/06/2021 for a virtual visit (video or telephone).   [name], FNP   Date: 05/06/2021 2:47 PM   Virtual Visit via Video Note   I, [name], connected with  ESLI [name]  ([id number], 24-Feb-1976) on 05/06/21 at  2:45 PM EST by a video-enabled telemedicine application and verified that I am speaking with the correct person using two identifiers.  Location: Patient: Virtual Visit Location Patient: Home Provider: Virtual Visit Location Provider: Home Office   I discussed the limitations of evaluation and management by  telemedicine and the availability of in person appointments. The patient expressed understanding and agreed to proceed.    History of Present Illness: Bobby [name] is a 45 y.o. who identifies as a male who was assigned male at birth, and is being seen today for sinus problems.  HPI: Sinusitis This is a new problem. The current episode started in the past 7 days. The problem has been waxing and waning since onset. There has been no fever. His pain is at a severity of 4/10. Associated symptoms include congestion, coughing, ear pain (left), headaches, sinus pressure and sneezing. Pertinent negatives include no chills, shortness of breath or sore throat. Past treatments include oral decongestants and acetaminophen. The treatment provided mild relief.   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   Lumbar radiculopathy 11/25/2014   B12 deficiency 04/13/2011   Acquired hypothyroidism 12/28/2010   Hyperlipidemia 12/28/2010   Morbid obesity 07/08/2010   Gastroesophageal reflux disease 07/08/2010   Obstructive sleep apnea 07/08/2010   ADHD (attention deficit hyperactivity disorder)     Allergies:  Allergies  Allergen Reactions   Strattera [Atomoxetine]  Urinary difficulties   Adhesive [Tape] Itching and Rash    Paper tape, orange tape and tegaderm okay per patient   Wellbutrin [Bupropion] Other (See Comments)    Urethral symptoms   Medications:  Current Outpatient Medications:    cetirizine (ZYRTEC) 10 MG tablet, Take 1 tablet (10 mg total) by mouth daily., Disp: 30 tablet, Rfl: 11   fluticasone (FLONASE) 50 MCG/ACT nasal spray, Place 2 sprays into both nostrils daily., Disp: 16 g, Rfl: 6   albuterol (VENTOLIN  HFA) 108 (90 Base) MCG/ACT inhaler, 1-2 puffs 20 minutes before exercise., Disp: 1 each, Rfl: 2   ARIPiprazole (ABILIFY) 10 MG tablet, Take 1 tablet by mouth once daily, Disp: 30 tablet, Rfl: 0   citalopram (CELEXA) 40 MG tablet, TAKE 1 TABLET BY MOUTH ONCE DAILY, Disp: 90 tablet, Rfl: 1   lisdexamfetamine (VYVANSE) 30 MG capsule, Take 1 capsule (30 mg total) by mouth daily., Disp: 90 capsule, Rfl: 0   NON FORMULARY, Pt uses cpap nightly, Disp: , Rfl:    traMADol (ULTRAM) 50 MG tablet, Take 1 tablet (50 mg total) by mouth every 6 (six) hours as needed., Disp: 10 tablet, Rfl: 0  Observations/Objective: Patient is well-developed, well-nourished in no acute distress.  Resting comfortably  at home.  Head is normocephalic, atraumatic.  No labored breathing.  Speech is clear and coherent with logical content.  Patient is alert and oriented at baseline.  Nasal congestion noted  Assessment and Plan: 1. Viral URI - fluticasone (FLONASE) 50 MCG/ACT nasal spray; Place 2 sprays into both nostrils daily.  Dispense: 16 g; Refill: 6 - cetirizine (ZYRTEC) 10 MG tablet; Take 1 tablet (10 mg total) by mouth daily.  Dispense: 30 tablet; Refill: 11 - Take meds as prescribed - Use a cool mist humidifier  -Use saline nose sprays frequently -Force fluids -For any cough or congestion  Use plain Mucinex- regular strength or max strength is fine -For fever or aces or pains- take tylenol or ibuprofen. -Throat lozenges if help Follow up if symptoms worsen or do not improve   Follow Up Instructions: I discussed the assessment and treatment plan with the patient. The patient was provided an opportunity to ask questions and all were answered. The patient agreed with the plan and demonstrated an understanding of the instructions.  A copy of instructions were sent to the patient via MyChart unless otherwise noted below.     The patient was advised to call back or seek an in-person evaluation if the symptoms worsen  or if the condition fails to improve as anticipated.  Time:  I spent 9 minutes with the patient via telehealth technology discussing the above problems/concerns.    [name], FNP

## 2021-05-06 NOTE — Patient Instructions (Signed)

## 2021-05-14 ENCOUNTER — Ambulatory Visit: Payer: BC Managed Care – PPO | Admitting: Pulmonary Disease

## 2021-06-02 ENCOUNTER — Other Ambulatory Visit: Payer: Self-pay | Admitting: Family Medicine
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
## 2021-06-09 ENCOUNTER — Ambulatory Visit: Payer: BC Managed Care – PPO | Admitting: Urology
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
## 2021-07-07 ENCOUNTER — Ambulatory Visit: Payer: BC Managed Care – PPO | Admitting: Urology

## 2021-07-07 DIAGNOSIS — N471 Phimosis: Secondary | ICD-10-CM

## 2021-07-07 DIAGNOSIS — N4883 Acquired buried penis: Secondary | ICD-10-CM

## 2021-07-07 DIAGNOSIS — R3912 Poor urinary stream: Secondary | ICD-10-CM
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
## 2021-07-21 ENCOUNTER — Ambulatory Visit: Payer: BC Managed Care – PPO | Admitting: Urology

## 2021-07-21 DIAGNOSIS — N471 Phimosis: Secondary | ICD-10-CM

## 2021-07-21 DIAGNOSIS — N4883 Acquired buried penis: Secondary | ICD-10-CM

## 2021-08-11 ENCOUNTER — Encounter: Payer: Self-pay | Admitting: Family Medicine

## 2021-08-11 ENCOUNTER — Ambulatory Visit: Payer: BC Managed Care – PPO | Admitting: Family Medicine

## 2021-08-11 ENCOUNTER — Other Ambulatory Visit: Payer: Self-pay

## 2021-08-11 VITALS — BP 138/82 | HR 64 | Temp 98.6°F | Ht 73.0 in | Wt >= 6400 oz

## 2021-08-11 DIAGNOSIS — F909 Attention-deficit hyperactivity disorder, unspecified type: Secondary | ICD-10-CM

## 2021-08-11 DIAGNOSIS — S069X9S Unspecified intracranial injury with loss of consciousness of unspecified duration, sequela: Secondary | ICD-10-CM

## 2021-08-11 DIAGNOSIS — R062 Wheezing: Secondary | ICD-10-CM

## 2021-08-11 DIAGNOSIS — F418 Other specified anxiety disorders: Secondary | ICD-10-CM | POA: Diagnosis not present

## 2021-08-11 MED ORDER — CITALOPRAM HYDROBROMIDE 40 MG PO TABS: ORAL_TABLET | ORAL | 1 refills | Status: DC

## 2021-08-11 MED ORDER — LISDEXAMFETAMINE DIMESYLATE 30 MG PO CAPS: 30.0000 mg | ORAL_CAPSULE | Freq: Every day | ORAL | 0 refills | Status: DC

## 2021-08-11 MED ORDER — ALBUTEROL SULFATE HFA 108 (90 BASE) MCG/ACT IN AERS: INHALATION_SPRAY | RESPIRATORY_TRACT | 2 refills | Status: DC

## 2021-08-11 NOTE — Patient Instructions (Signed)
°  Next appt in 5.5 mos. We will collect labs that day
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
## 2021-08-25 ENCOUNTER — Encounter: Payer: Self-pay | Admitting: Urology

## 2021-08-25 ENCOUNTER — Ambulatory Visit: Payer: BC Managed Care – PPO | Admitting: Urology

## 2021-08-25 ENCOUNTER — Other Ambulatory Visit: Payer: Self-pay

## 2021-08-25 VITALS — BP 131/82 | HR 69

## 2021-08-25 DIAGNOSIS — N4883 Acquired buried penis: Secondary | ICD-10-CM | POA: Diagnosis not present

## 2021-08-25 DIAGNOSIS — B356 Tinea cruris: Secondary | ICD-10-CM

## 2021-08-25 DIAGNOSIS — N471 Phimosis: Secondary | ICD-10-CM | POA: Diagnosis not present

## 2021-08-25 MED ORDER — CLOTRIMAZOLE-BETAMETHASONE 1-0.05 % EX CREA: TOPICAL_CREAM | CUTANEOUS | 1 refills | Status: DC

## 2021-08-26 LAB — MICROSCOPIC EXAMINATION
Epithelial Cells (non renal): NONE SEEN /hpf (ref 0–10)
RBC, Urine: NONE SEEN /hpf (ref 0–2)
Renal Epithel, UA: NONE SEEN /hpf

## 2021-08-26 LAB — URINALYSIS, ROUTINE W REFLEX MICROSCOPIC
Bilirubin, UA: NEGATIVE
Glucose, UA: NEGATIVE
Ketones, UA: NEGATIVE
Nitrite, UA: NEGATIVE
Protein,UA: NEGATIVE
RBC, UA: NEGATIVE
Specific Gravity, UA: 1.025 (ref 1.005–1.030)
Urobilinogen, Ur: 0.2 mg/dL (ref 0.2–1.0)
pH, UA: 6 (ref 5.0–7.5)

## 2021-12-18 ENCOUNTER — Telehealth: Payer: Self-pay

## 2021-12-18 NOTE — Telephone Encounter (Signed)
Rx was sent 08/11/21 for 6 m/s. Pt should not need refill until august and will need to see provider for refills.   [facility] Day - Client Nonclinical Telephone Record  [facility] Client [facility] Day - Client Client Site [facility] - Day Provider Claiborne [place], [place] Contact Type Call Who Is Calling Patient / Member / Family / Caregiver Caller Name [name] Caller Phone Number [phone] Patient Name Bobby [name] Patient DOB 1976-03-21 Call Type Message Only Information Provided Reason for Call Medication Question / Request Initial Comment Caller states he needs a refill on his Vyvanse. Disp. Time Disposition Final User 12/17/2021 1:13:04 PM General Information Provided Yes Baker Pierini Call Closed By: Baker Pierini Transaction Date/Time: 12/17/2021 1:11:10 PM (ET)

## 2021-12-22 MED ORDER — LISDEXAMFETAMINE DIMESYLATE 30 MG PO CAPS: 30.0000 mg | ORAL_CAPSULE | Freq: Every day | ORAL | 0 refills | Status: DC

## 2021-12-22 NOTE — Telephone Encounter (Signed)
Please call pt. He does not need appt. Please cancel 7/13 ap. His pharmacy should have had that script. They were sent same day - but I called in new script today to cover him

## 2021-12-22 NOTE — Addendum Note (Signed)
Addended by: Felix Pacini A on: 12/22/2021 04:44 PM   Modules accepted: Orders

## 2021-12-22 NOTE — Telephone Encounter (Signed)
Pt called pharmacy and was told that they do not have Rx on file. Pt only has 3 left. Please advise, pt is scheduled for 07/13. If appt is not needed, confirmed with pharmacy no Rx on file for refill. Pt only received 90 d/s in Feb.

## 2021-12-22 NOTE — Telephone Encounter (Signed)
Appt cancelled. Voicemail full

## 2021-12-24 ENCOUNTER — Ambulatory Visit: Payer: BC Managed Care – PPO | Admitting: Family Medicine

## 2022-01-26 ENCOUNTER — Ambulatory Visit (INDEPENDENT_AMBULATORY_CARE_PROVIDER_SITE_OTHER): Payer: BC Managed Care – PPO | Admitting: Family Medicine

## 2022-01-26 DIAGNOSIS — F418 Other specified anxiety disorders: Secondary | ICD-10-CM

## 2022-01-26 DIAGNOSIS — Z91199 Patient's noncompliance with other medical treatment and regimen due to unspecified reason: Secondary | ICD-10-CM

## 2022-01-26 DIAGNOSIS — R062 Wheezing: Secondary | ICD-10-CM
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
## 2022-02-01 ENCOUNTER — Encounter: Payer: Self-pay | Admitting: Family Medicine

## 2022-03-02 ENCOUNTER — Encounter: Payer: Self-pay | Admitting: Family Medicine

## 2022-03-02 ENCOUNTER — Ambulatory Visit: Payer: BC Managed Care – PPO | Admitting: Family Medicine

## 2022-03-02 VITALS — BP 112/71 | HR 73 | Temp 98.6°F | Ht 73.0 in | Wt >= 6400 oz

## 2022-03-02 DIAGNOSIS — E538 Deficiency of other specified B group vitamins: Secondary | ICD-10-CM

## 2022-03-02 DIAGNOSIS — E559 Vitamin D deficiency, unspecified: Secondary | ICD-10-CM

## 2022-03-02 DIAGNOSIS — F418 Other specified anxiety disorders: Secondary | ICD-10-CM

## 2022-03-02 DIAGNOSIS — S069X9S Unspecified intracranial injury with loss of consciousness of unspecified duration, sequela: Secondary | ICD-10-CM

## 2022-03-02 DIAGNOSIS — Z23 Encounter for immunization: Secondary | ICD-10-CM | POA: Diagnosis not present

## 2022-03-02 DIAGNOSIS — E7849 Other hyperlipidemia: Secondary | ICD-10-CM

## 2022-03-02 DIAGNOSIS — F909 Attention-deficit hyperactivity disorder, unspecified type: Secondary | ICD-10-CM

## 2022-03-02 DIAGNOSIS — Z87438 Personal history of other diseases of male genital organs: Secondary | ICD-10-CM

## 2022-03-02 MED ORDER — CITALOPRAM HYDROBROMIDE 40 MG PO TABS: ORAL_TABLET | ORAL | 1 refills | Status: DC

## 2022-03-02 MED ORDER — LISDEXAMFETAMINE DIMESYLATE 40 MG PO CAPS: 40.0000 mg | ORAL_CAPSULE | ORAL | 0 refills | Status: DC
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
## 2022-03-02 NOTE — Patient Instructions (Signed)
No follow-ups on file.        Great to see you today.  I have refilled the medication(s) we provide.   If labs were collected, we will inform you of lab results once received either by echart message or telephone call.   - echart message- for normal results that have been seen by the patient already.   - telephone call: abnormal results or if patient has not viewed results in their echart.  

## 2022-03-03 ENCOUNTER — Telehealth: Payer: Self-pay | Admitting: Family Medicine

## 2022-03-03 LAB — CBC
HCT: 39.1 % (ref 38.5–50.0)
Hemoglobin: 13.4 g/dL (ref 13.2–17.1)
MCH: 28.1 pg (ref 27.0–33.0)
MCHC: 34.3 g/dL (ref 32.0–36.0)
MCV: 82 fL (ref 80.0–100.0)
MPV: 10.8 fL (ref 7.5–12.5)
Platelets: 344 10*3/uL (ref 140–400)
RBC: 4.77 10*6/uL (ref 4.20–5.80)
RDW: 13.9 % (ref 11.0–15.0)
WBC: 7.9 10*3/uL (ref 3.8–10.8)

## 2022-03-03 LAB — COMPREHENSIVE METABOLIC PANEL
AG Ratio: 1.2 (calc) (ref 1.0–2.5)
ALT: 16 U/L (ref 9–46)
AST: 15 U/L (ref 10–40)
Albumin: 4 g/dL (ref 3.6–5.1)
Alkaline phosphatase (APISO): 74 U/L (ref 36–130)
BUN: 15 mg/dL (ref 7–25)
CO2: 23 mmol/L (ref 20–32)
Calcium: 9.1 mg/dL (ref 8.6–10.3)
Chloride: 105 mmol/L (ref 98–110)
Creat: 0.98 mg/dL (ref 0.60–1.29)
Globulin: 3.4 g/dL (calc) (ref 1.9–3.7)
Glucose, Bld: 102 mg/dL — ABNORMAL HIGH (ref 65–99)
Potassium: 4.1 mmol/L (ref 3.5–5.3)
Sodium: 138 mmol/L (ref 135–146)
Total Bilirubin: 0.5 mg/dL (ref 0.2–1.2)
Total Protein: 7.4 g/dL (ref 6.1–8.1)

## 2022-03-03 LAB — TSH: TSH: 4.46 mIU/L (ref 0.40–4.50)

## 2022-03-03 LAB — VITAMIN B12: Vitamin B-12: 402 pg/mL (ref 200–1100)

## 2022-03-03 LAB — VITAMIN D 25 HYDROXY (VIT D DEFICIENCY, FRACTURES): Vit D, 25-Hydroxy: 17 ng/mL — ABNORMAL LOW (ref 30–100)

## 2022-03-03 MED ORDER — VITAMIN D (ERGOCALCIFEROL) 1.25 MG (50000 UNIT) PO CAPS: 50000.0000 [IU] | ORAL_CAPSULE | ORAL | 3 refills | Status: DC

## 2022-03-03 NOTE — Telephone Encounter (Signed)
Please call patient Liver, kidney and thyroid function are normal Blood cell counts and electrolytes are normal Vitamin D is very low at 17.  I have called in the once weekly high-dose vitamin D for him to start again.  Should take this medication with food for best absorption.  There are refills on this medication and he should stay on this indefinitely. His B12 is in the low normal range at 402.  If not taking daily sublingual B12, would encourage him to start 1000 mcg sublingual daily.

## 2022-03-04 DIAGNOSIS — Z87438 Personal history of other diseases of male genital organs: Secondary | ICD-10-CM

## 2022-03-04 HISTORY — DX: Personal history of other diseases of male genital organs: Z87.438

## 2022-03-04 NOTE — Telephone Encounter (Signed)
Tried calling patient, unable to LVM.  

## 2022-03-05 NOTE — Telephone Encounter (Signed)
Attempted to contact pt. Unable to lvm.

## 2022-03-05 NOTE — Telephone Encounter (Signed)
error 

## 2022-03-05 NOTE — Telephone Encounter (Signed)
Spoke with pt regarding labs and instructions.   

## 2022-03-19 ENCOUNTER — Telehealth: Payer: Self-pay

## 2022-03-19 NOTE — Telephone Encounter (Signed)
Alfred Levins ([facility]) [phone] Lisdexamfetamine Dimesylate 40MG  capsules Status: PA Response - Approved Created: October 5th, 2023 [id number] Sent: October 5th, 2023

## 2022-06-29 ENCOUNTER — Telehealth: Payer: BC Managed Care – PPO | Admitting: Emergency Medicine

## 2022-06-29 DIAGNOSIS — R6889 Other general symptoms and signs: Secondary | ICD-10-CM | POA: Diagnosis not present

## 2022-06-29 NOTE — Patient Instructions (Signed)
  Bobby [name], thank you for joining Montine Circle, PA-C for today's virtual visit.  While this provider is not your primary care provider (PCP), if your PCP is located in our provider database this encounter information will be shared with them immediately following your visit.   [facility] account gives you access to today's visit and all your visits, tests, and labs performed at [facility] " click here if you don't have a [facility] account or go to [email or web address]  Consent: (Patient) Bobby [name] provided verbal consent for this virtual visit at the beginning of the encounter.  Current Medications:  Current Outpatient Medications:    albuterol (VENTOLIN HFA) 108 (90 Base) MCG/ACT inhaler, 1-2 puffs 20 minutes before exercise., Disp: 1 each, Rfl: 2   citalopram (CELEXA) 40 MG tablet, TAKE 1 TABLET BY MOUTH ONCE DAILY, Disp: 90 tablet, Rfl: 1   lisdexamfetamine (VYVANSE) 40 MG capsule, Take 1 capsule (40 mg total) by mouth every morning., Disp: 90 capsule, Rfl: 0   lisdexamfetamine (VYVANSE) 40 MG capsule, Take 1 capsule (40 mg total) by mouth every morning., Disp: 90 capsule, Rfl: 0   NON FORMULARY, Pt uses cpap nightly, Disp: , Rfl:    Vitamin D, Ergocalciferol, (DRISDOL) 1.25 MG (50000 UNIT) CAPS capsule, Take 1 capsule (50,000 Units total) by mouth every 7 (seven) days., Disp: 12 capsule, Rfl: 3   Medications ordered in this encounter:  No orders of the defined types were placed in this encounter.    *If you need refills on other medications prior to your next appointment, please contact your pharmacy*  Follow-Up: Call back or seek an in-person evaluation if the symptoms worsen or if the condition fails to improve as anticipated.  [place] [phone]  Other Instructions You can return to work 24 hours after your last fever, which would be tomorrow morning provided no fever returns.   If you have been  instructed to have an in-person evaluation today at a local Urgent Care facility, please use the link below. It will take you to a list of all of our available [facility] [place], including address, phone number and hours of operation. Please do not delay care.  [facility]  If you or a family member do not have a primary care provider, use the link below to schedule a visit and establish care. When you choose a [facility] primary care physician or advanced practice provider, you gain a long-term partner in health. Find a Primary Care Provider  Learn more about [facility] in-office and virtual care options: [facility] Now

## 2022-06-29 NOTE — Progress Notes (Signed)
Virtual Visit Consent   Bobby [name], you are scheduled for a virtual visit with a [facility] provider today. Just as with appointments in the office, your consent must be obtained to participate. Your consent will be active for this visit and any virtual visit you may have with one of our providers in the next 365 days. If you have a MyChart account, a copy of this consent can be sent to you electronically.  As this is a virtual visit, video technology does not allow for your provider to perform a traditional examination. This may limit your provider's ability to fully assess your condition. If your provider identifies any concerns that need to be evaluated in person or the need to arrange testing (such as labs, EKG, etc.), we will make arrangements to do so. Although advances in technology are sophisticated, we cannot ensure that it will always work on either your end or our end. If the connection with a video visit is poor, the visit may have to be switched to a telephone visit. With either a video or telephone visit, we are not always able to ensure that we have a secure connection.  By engaging in this virtual visit, you consent to the provision of healthcare and authorize for your insurance to be billed (if applicable) for the services provided during this visit. Depending on your insurance coverage, you may receive a charge related to this service.  I need to obtain your verbal consent now. Are you willing to proceed with your visit today? Bobby [name] has provided verbal consent on 06/29/2022 for a virtual visit (video or telephone). Bobby Circle, PA-C  Date: 06/29/2022 10:43 AM  Virtual Visit via Video Note   I, Bobby [name], connected with  Bobby [name]  ([id number], 05-03-1976) on 06/29/22 at 10:30 AM EST by a video-enabled telemedicine application and verified that I am speaking with the correct person using two identifiers.  Location: Patient: Virtual Visit Location  Patient: Home Provider: Virtual Visit Location Provider: Home Office   I discussed the limitations of evaluation and management by telemedicine and the availability of in person appointments. The patient expressed understanding and agreed to proceed.    History of Present Illness: Bobby [name] is a 47 y.o. who identifies as a male who was assigned male at birth, and is being seen today for return to work note.  Onset Sunday.  States that later that night he began having fevers, chills, sweats.  States that symptoms seemed to have resolved yesterday.  He states that he still felt a bit bad this morning.  States that he feels like he is improving.  Took a COVID test this morning that was negative. States that he needs a note to return to work.  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   Lumbar radiculopathy 11/25/2014   B12 deficiency 04/13/2011   Acquired hypothyroidism 12/28/2010   Hyperlipidemia 12/28/2010   Morbid obesity 07/08/2010   Gastroesophageal reflux disease 07/08/2010   Obstructive sleep apnea 07/08/2010   ADHD (attention deficit hyperactivity disorder)     Allergies:  Allergies  Allergen Reactions   Strattera [Atomoxetine]     Urinary  difficulties   Adhesive [Tape] Itching and Rash    Paper tape, orange tape and tegaderm okay per patient   Wellbutrin [Bupropion] Other (See Comments)    Urethral symptoms   Medications:  Current Outpatient Medications:    albuterol (VENTOLIN HFA) 108 (90 Base) MCG/ACT inhaler, 1-2 puffs 20 minutes before exercise., Disp: 1 each, Rfl: 2   citalopram (CELEXA) 40 MG tablet, TAKE 1 TABLET BY MOUTH  ONCE DAILY, Disp: 90 tablet, Rfl: 1   lisdexamfetamine (VYVANSE) 40 MG capsule, Take 1 capsule (40 mg total) by mouth every morning., Disp: 90 capsule, Rfl: 0   lisdexamfetamine (VYVANSE) 40 MG capsule, Take 1 capsule (40 mg total) by mouth every morning., Disp: 90 capsule, Rfl: 0   NON FORMULARY, Pt uses cpap nightly, Disp: , Rfl:    Vitamin D, Ergocalciferol, (DRISDOL) 1.25 MG (50000 UNIT) CAPS capsule, Take 1 capsule (50,000 Units total) by mouth every 7 (seven) days., Disp: 12 capsule, Rfl: 3  Observations/Objective: Patient is well-developed, well-nourished in no acute distress.  Resting comfortably at home.  Head is normocephalic, atraumatic.  No labored breathing.  Speech is clear and coherent with logical content.  Patient is alert and oriented at baseline.    Assessment and Plan: 1. Flu-like symptoms   Symptoms are resolving.  Negative COVID test.  Will give note to return to work 24 hours after last fever, which would mean returning to work tomorrow morning.  Follow Up Instructions: I discussed the assessment and treatment plan with the patient. The patient was provided an opportunity to ask questions and all were answered. The patient agreed with the plan and demonstrated an understanding of the instructions.  A copy of instructions were sent to the patient via MyChart unless otherwise noted below.     The patient was advised to call back or seek an in-person evaluation if the symptoms worsen or if the condition fails to improve as anticipated.  Time:  I spent 10 minutes with the patient via telehealth technology discussing the above problems/concerns.    Bobby Circle, PA-C

## 2022-07-22 ENCOUNTER — Encounter: Payer: Self-pay | Admitting: Family Medicine

## 2022-07-22 ENCOUNTER — Ambulatory Visit: Payer: BC Managed Care – PPO | Admitting: Family Medicine

## 2022-07-22 VITALS — BP 136/82 | HR 78 | Temp 98.0°F | Wt >= 6400 oz

## 2022-07-22 DIAGNOSIS — F418 Other specified anxiety disorders: Secondary | ICD-10-CM | POA: Diagnosis not present

## 2022-07-22 DIAGNOSIS — E782 Mixed hyperlipidemia: Secondary | ICD-10-CM | POA: Diagnosis not present

## 2022-07-22 DIAGNOSIS — E559 Vitamin D deficiency, unspecified: Secondary | ICD-10-CM

## 2022-07-22 DIAGNOSIS — F902 Attention-deficit hyperactivity disorder, combined type: Secondary | ICD-10-CM

## 2022-07-22 DIAGNOSIS — G622 Polyneuropathy due to other toxic agents: Secondary | ICD-10-CM | POA: Insufficient documentation

## 2022-07-22 DIAGNOSIS — Z79899 Other long term (current) drug therapy: Secondary | ICD-10-CM | POA: Diagnosis not present

## 2022-07-22 DIAGNOSIS — S069X9S Unspecified intracranial injury with loss of consciousness of unspecified duration, sequela: Secondary | ICD-10-CM

## 2022-07-22 DIAGNOSIS — R062 Wheezing: Secondary | ICD-10-CM

## 2022-07-22 DIAGNOSIS — E538 Deficiency of other specified B group vitamins: Secondary | ICD-10-CM

## 2022-07-22 LAB — VITAMIN D 25 HYDROXY (VIT D DEFICIENCY, FRACTURES): VITD: 13.94 ng/mL — ABNORMAL LOW (ref 30.00–100.00)

## 2022-07-22 LAB — HEMOGLOBIN A1C: Hgb A1c MFr Bld: 5.9 % (ref 4.6–6.5)

## 2022-07-22 LAB — MAGNESIUM: Magnesium: 2 mg/dL (ref 1.5–2.5)

## 2022-07-22 MED ORDER — VITAMIN D (ERGOCALCIFEROL) 1.25 MG (50000 UNIT) PO CAPS: 50000.0000 [IU] | ORAL_CAPSULE | ORAL | 11 refills | Status: DC

## 2022-07-22 MED ORDER — CITALOPRAM HYDROBROMIDE 40 MG PO TABS: ORAL_TABLET | ORAL | 1 refills | Status: DC

## 2022-07-22 MED ORDER — WEGOVY 0.5 MG/0.5ML ~~LOC~~ SOAJ: 0.5000 mg | SUBCUTANEOUS | 0 refills | Status: DC

## 2022-07-22 MED ORDER — ALBUTEROL SULFATE HFA 108 (90 BASE) MCG/ACT IN AERS: INHALATION_SPRAY | RESPIRATORY_TRACT | 2 refills | Status: DC

## 2022-07-22 MED ORDER — LISDEXAMFETAMINE DIMESYLATE 40 MG PO CAPS: 40.0000 mg | ORAL_CAPSULE | ORAL | 0 refills | Status: DC

## 2022-07-22 MED ORDER — WEGOVY 0.25 MG/0.5ML ~~LOC~~ SOAJ: 0.2500 mg | SUBCUTANEOUS | 0 refills | Status: DC

## 2022-07-22 NOTE — Patient Instructions (Addendum)
Return in about 24 weeks (around 01/06/2023) for Routine chronic condition follow-up.        Great to see you today.  I have refilled the medication(s) we provide.   If labs were collected, we will inform you of lab results once received either by echart message or telephone call.   - echart message- for normal results that have been seen by the patient already.   - telephone call: abnormal results or if patient has not viewed results in their echart.   If you are interested in weight loss counseling please make appt to discuss and bring with you 2 weeks of a food diary/log on notebook paper.  Food log is mandatory on first appt to proceed with counseling.  Weight loss counseling encompasses diet, exercise and can include  medications when appropriate and affordable.  There are routine appts for check-ins and weights to track progress and keep you on track.  Routine check-ins (in person) are also mandatory to continue with prescription refills. Check-in timeline  can range from 4 weeks to 12 weeks, depending on physician's recommendations and which step you are in of your weight loss journey.  Your BMI today is Body mass index is 59.03 kg/m.  Please check with your insurance prior to appt and ask them if they cover weight loss medications for your BMI? And if so, which medications. They may tell you some of the diabetes meds that are used for weight loss also,  are on your formulary- but this does not mean they are covered for weight loss only.  Even if they tell with a prior auth it is covered- make sure they check to see if you personally meet criteria with your BMI.
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
## 2022-07-26 MED ORDER — LISDEXAMFETAMINE DIMESYLATE 40 MG PO CAPS: 40.0000 mg | ORAL_CAPSULE | ORAL | 0 refills | Status: DC

## 2022-07-26 NOTE — Telephone Encounter (Signed)
Please inform patient: 1.  No other options to replace the [facility] unfortunately. 2.  We can call his Vyvanse in for a 30-day at the [facility] on [address].  However, when he is nearing the end of his prescription he will need to let us know at least 5 days in advance and where he would like the next prescription called into, he will need to call the pharmacy and see who has it in stock.

## 2022-07-26 NOTE — Telephone Encounter (Signed)
Patient called back regarding Vyvanse. Patient called around to see what pharmacy had med in [facility] on [address] has medication in stock but will only fill quantity on #30 because of national back order.  Please send in new prescription to [facility] on [address]  lisdexamfetamine (VYVANSE) 40 MG capsule  30 d/s  Please call patient when completed [phone]

## 2022-07-26 NOTE — Telephone Encounter (Signed)
Confirmed with pharmacy that they did not receive the Rx for 07/22/22. They did get the 10/07/22 rx but they can not fill due to date and medication out of stock. Called pt to call his next preferred pharmacy to make sure they have it in stock prior to PCP resending.

## 2022-08-17 ENCOUNTER — Ambulatory Visit: Payer: BC Managed Care – PPO | Admitting: Family Medicine

## 2022-08-17 VITALS — BP 123/81 | HR 74 | Temp 99.0°F | Wt >= 6400 oz

## 2022-08-17 DIAGNOSIS — E782 Mixed hyperlipidemia: Secondary | ICD-10-CM

## 2022-08-17 MED ORDER — ROSUVASTATIN CALCIUM 10 MG PO TABS: 10.0000 mg | ORAL_TABLET | Freq: Every evening | ORAL | 3 refills | Status: DC | PRN
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
## 2022-08-27 ENCOUNTER — Encounter: Payer: Self-pay | Admitting: Family Medicine

## 2022-08-30 NOTE — Telephone Encounter (Signed)
Unfortunately, there is not much I can do to help at this point.  This seems to be an insurance issue and a back stock issue. Has been unable to tolerate any other stimulant class, Concerta/methylphenidate gave him irritable side effects and he cannot tolerate the Wellbutrin or Strattera.  Unfortunately, there are no other classes of medications to attempt.

## 2022-09-01 ENCOUNTER — Telehealth: Payer: Self-pay | Admitting: Family Medicine

## 2022-09-01 ENCOUNTER — Other Ambulatory Visit (HOSPITAL_COMMUNITY): Payer: Self-pay

## 2022-09-01 MED ORDER — LISDEXAMFETAMINE DIMESYLATE 40 MG PO CAPS
40.0000 mg | ORAL_CAPSULE | ORAL | 0 refills | Status: DC
  Filled 2022-09-01: qty 90, 90d supply, fill #0

## 2022-09-01 NOTE — Telephone Encounter (Signed)
LM for pt to return call to discuss.  

## 2022-09-01 NOTE — Telephone Encounter (Signed)
Patient was able to locate lisdexamfetamine (VYVANSE) 40 MG caps  at [facility] on [address] in [place]. He requesting 60 day supply, but  they can accommodate for at least 30 at that pharmacy.

## 2022-09-01 NOTE — Telephone Encounter (Signed)
I have called in a 90-day supply Vyvanse 40 mg to the [facility] from his pharmacy on [address].  I have allowed for partial refill if necessary due to low stock.

## 2022-09-03 ENCOUNTER — Other Ambulatory Visit (HOSPITAL_COMMUNITY): Payer: Self-pay

## 2022-11-12 ENCOUNTER — Telehealth: Payer: BC Managed Care – PPO | Admitting: Physician Assistant

## 2022-11-12 DIAGNOSIS — T887XXA Unspecified adverse effect of drug or medicament, initial encounter: Secondary | ICD-10-CM | POA: Diagnosis not present

## 2022-11-12 DIAGNOSIS — Z9889 Other specified postprocedural states: Secondary | ICD-10-CM

## 2022-11-12 DIAGNOSIS — R5383 Other fatigue: Secondary | ICD-10-CM | POA: Diagnosis not present
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
## 2022-11-12 NOTE — Patient Instructions (Signed)
  Bobby [name], thank you for joining Margaretann Loveless, PA-C for today's virtual visit.  While this provider is not your primary care provider (PCP), if your PCP is located in our provider database this encounter information will be shared with them immediately following your visit.   A [facility] MyChart account gives you access to today's visit and all your visits, tests, and labs performed at [facility] " click here if you don't have a [facility] MyChart account or go to [email or web address]  Consent: (Patient) Bobby [name] provided verbal consent for this virtual visit at the beginning of the encounter.  Current Medications:  Current Outpatient Medications:    albuterol (VENTOLIN HFA) 108 (90 Base) MCG/ACT inhaler, 1-2 puffs 20 minutes before exercise., Disp: 1 each, Rfl: 2   betamethasone valerate ointment (VALISONE) 0.1 %, Apply topically., Disp: , Rfl:    citalopram (CELEXA) 40 MG tablet, TAKE 1 TABLET BY MOUTH ONCE DAILY, Disp: 90 tablet, Rfl: 1   lisdexamfetamine (VYVANSE) 40 MG capsule, Take 1 capsule (40 mg total) by mouth every morning., Disp: 90 capsule, Rfl: 0   NON FORMULARY, Pt uses cpap nightly, Disp: , Rfl:    rosuvastatin (CRESTOR) 10 MG tablet, Take 1 tablet (10 mg total) by mouth at bedtime as needed., Disp: 90 tablet, Rfl: 3   Vitamin D, Ergocalciferol, (DRISDOL) 1.25 MG (50000 UNIT) CAPS capsule, Take 1 capsule (50,000 Units total) by mouth every 7 (seven) days., Disp: 8 capsule, Rfl: 11   Medications ordered in this encounter:  No orders of the defined types were placed in this encounter.    *If you need refills on other medications prior to your next appointment, please contact your pharmacy*  Follow-Up: Call back or seek an in-person evaluation if the symptoms worsen or if the condition fails to improve as anticipated.  [facility] [phone]   If you have been instructed to have an in-person evaluation today at a  local Urgent Care facility, please use the link below. It will take you to a list of all of our available [facility], including address, phone number and hours of operation. Please do not delay care.  [facility]  If you or a family member do not have a primary care provider, use the link below to schedule a visit and establish care. When you choose a [facility] primary care physician or advanced practice provider, you gain a long-term partner in health. Find a Primary Care Provider  Learn more about [facility]'s in-office and virtual care options: [facility] - Get Care Now

## 2022-12-08 ENCOUNTER — Other Ambulatory Visit (HOSPITAL_COMMUNITY): Payer: Self-pay

## 2022-12-08 ENCOUNTER — Other Ambulatory Visit: Payer: Self-pay | Admitting: Family Medicine

## 2022-12-08 ENCOUNTER — Encounter (HOSPITAL_COMMUNITY): Payer: Self-pay

## 2022-12-08 MED ORDER — LISDEXAMFETAMINE DIMESYLATE 40 MG PO CAPS
40.0000 mg | ORAL_CAPSULE | ORAL | 0 refills | Status: DC
  Filled 2022-12-08 – 2022-12-23 (×2): qty 90, 90d supply, fill #0

## 2022-12-08 NOTE — Telephone Encounter (Signed)
Filled the Vyvanse 40 mg for him.

## 2022-12-08 NOTE — Telephone Encounter (Signed)
Pt requesting partial supply of Vyvanse sent to [facility]. Please advise.

## 2022-12-13 ENCOUNTER — Other Ambulatory Visit (HOSPITAL_COMMUNITY): Payer: Self-pay

## 2022-12-13 ENCOUNTER — Other Ambulatory Visit: Payer: Self-pay

## 2022-12-23 ENCOUNTER — Other Ambulatory Visit (HOSPITAL_COMMUNITY): Payer: Self-pay

## 2023-01-06 ENCOUNTER — Ambulatory Visit: Payer: BC Managed Care – PPO | Admitting: Family Medicine

## 2023-01-12 ENCOUNTER — Emergency Department (HOSPITAL_BASED_OUTPATIENT_CLINIC_OR_DEPARTMENT_OTHER)
Admission: EM | Admit: 2023-01-12 | Discharge: 2023-01-13 | Disposition: A | Discharge status: Home / Self Care | Payer: BC Managed Care – PPO | Attending: Emergency Medicine | Admitting: Emergency Medicine

## 2023-01-12 ENCOUNTER — Encounter (INDEPENDENT_AMBULATORY_CARE_PROVIDER_SITE_OTHER): Payer: Self-pay

## 2023-01-12 ENCOUNTER — Other Ambulatory Visit: Payer: Self-pay

## 2023-01-12 ENCOUNTER — Emergency Department (HOSPITAL_BASED_OUTPATIENT_CLINIC_OR_DEPARTMENT_OTHER): Payer: BC Managed Care – PPO

## 2023-01-12 ENCOUNTER — Encounter (HOSPITAL_BASED_OUTPATIENT_CLINIC_OR_DEPARTMENT_OTHER): Payer: Self-pay

## 2023-01-12 DIAGNOSIS — R531 Weakness: Secondary | ICD-10-CM | POA: Diagnosis not present

## 2023-01-12 DIAGNOSIS — R2990 Unspecified symptoms and signs involving the nervous system: Secondary | ICD-10-CM

## 2023-01-12 DIAGNOSIS — R42 Dizziness and giddiness: Secondary | ICD-10-CM | POA: Diagnosis not present

## 2023-01-12 DIAGNOSIS — R519 Headache, unspecified: Secondary | ICD-10-CM | POA: Insufficient documentation

## 2023-01-12 LAB — DIFFERENTIAL
Abs Immature Granulocytes: 0.04 10*3/uL (ref 0.00–0.07)
Basophils Absolute: 0 10*3/uL (ref 0.0–0.1)
Basophils Relative: 0 %
Eosinophils Absolute: 0.1 10*3/uL (ref 0.0–0.5)
Eosinophils Relative: 1 %
Immature Granulocytes: 0 %
Lymphocytes Relative: 25 %
Lymphs Abs: 2.5 10*3/uL (ref 0.7–4.0)
Monocytes Absolute: 0.6 10*3/uL (ref 0.1–1.0)
Monocytes Relative: 6 %
Neutro Abs: 6.6 10*3/uL (ref 1.7–7.7)
Neutrophils Relative %: 68 %

## 2023-01-12 LAB — CBC
HCT: 38.7 % — ABNORMAL LOW (ref 39.0–52.0)
Hemoglobin: 12.7 g/dL — ABNORMAL LOW (ref 13.0–17.0)
MCH: 27.9 pg (ref 26.0–34.0)
MCHC: 32.8 g/dL (ref 30.0–36.0)
MCV: 84.9 fL (ref 80.0–100.0)
Platelets: 325 10*3/uL (ref 150–400)
RBC: 4.56 MIL/uL (ref 4.22–5.81)
RDW: 14.2 % (ref 11.5–15.5)
WBC: 9.8 10*3/uL (ref 4.0–10.5)
nRBC: 0 % (ref 0.0–0.2)

## 2023-01-12 LAB — COMPREHENSIVE METABOLIC PANEL
ALT: 16 U/L (ref 0–44)
AST: 13 U/L — ABNORMAL LOW (ref 15–41)
Albumin: 4 g/dL (ref 3.5–5.0)
Alkaline Phosphatase: 50 U/L (ref 38–126)
Anion gap: 6 (ref 5–15)
BUN: 13 mg/dL (ref 6–20)
CO2: 30 mmol/L (ref 22–32)
Calcium: 8.9 mg/dL (ref 8.9–10.3)
Chloride: 101 mmol/L (ref 98–111)
Creatinine, Ser: 1.18 mg/dL (ref 0.61–1.24)
GFR, Estimated: >60 mL/min (ref >60)
Glucose, Bld: 99 mg/dL (ref 70–99)
Potassium: 3.7 mmol/L (ref 3.5–5.1)
Sodium: 137 mmol/L (ref 135–145)
Total Bilirubin: 0.7 mg/dL (ref 0.3–1.2)
Total Protein: 7.4 g/dL (ref 6.5–8.1)

## 2023-01-12 LAB — ETHANOL: Alcohol, Ethyl (B): <10 mg/dL (ref <10)

## 2023-01-12 MED ORDER — METOCLOPRAMIDE HCL 5 MG/ML IJ SOLN
10.0000 mg | INTRAMUSCULAR | Status: AC
  Administered 2023-01-12: 10 mg via INTRAVENOUS
  Filled 2023-01-12: qty 2

## 2023-01-12 MED ORDER — IOHEXOL 350 MG/ML SOLN
100.0000 mL | Freq: Once | INTRAVENOUS | Status: AC | PRN
  Administered 2023-01-12: 80 mL via INTRAVENOUS

## 2023-01-12 MED ORDER — DIPHENHYDRAMINE HCL 50 MG/ML IJ SOLN
25.0000 mg | Freq: Once | INTRAMUSCULAR | Status: AC
  Administered 2023-01-12: 25 mg via INTRAVENOUS
  Filled 2023-01-12: qty 1

## 2023-01-12 NOTE — ED Provider Notes (Signed)
[place] EMERGENCY DEPARTMENT AT [facility] Provider Note   CSN: [id number] Arrival date & time: 01/12/23  2142     History {Add pertinent medical, surgical, social history, OB history to HPI:1} Chief Complaint  Patient presents with   Headache   Extremity Weakness    Bobby [name] is a 47 y.o. male.  47 year old male with history of traumatic subarachnoid hemorrhage, hyperlipidemia, and tremor who presents to the emergency department with headache and right-sided weakness.  Patient states that for the past 2 days has been having headache and right-sided weakness.  Says that his last known well was on Monday evening.  Says that since then has developed a severe headache that started on Tuesday morning as well as dizziness (that felt like room spinning) and weakness of his right upper and right lower extremity.  Says he been sleeping off and on for the past 2 days and is unsure exactly when his weakness started.  Says the headache is severe on the right side and retro-orbital.  Was abrupt in onset.  Denies any numbness, vision changes, slurred speech.       Home Medications Prior to Admission medications   Medication Sig Start Date End Date Taking? Authorizing Provider  albuterol (VENTOLIN HFA) 108 (90 Base) MCG/ACT inhaler 1-2 puffs 20 minutes before exercise. 07/22/22   Kuneff, Renee A, DO  betamethasone valerate ointment (VALISONE) 0.1 % Apply topically. 05/24/22 05/24/23  [provider]  citalopram (CELEXA) 40 MG tablet TAKE 1 TABLET BY MOUTH ONCE DAILY 07/22/22   Kuneff, Renee A, DO  lisdexamfetamine (VYVANSE) 40 MG capsule Take 1 capsule (40 mg total) by mouth every morning. 12/08/22   Kuneff, Renee A, DO  NON FORMULARY Pt uses cpap nightly    [provider]  rosuvastatin (CRESTOR) 10 MG tablet Take 1 tablet (10 mg total) by mouth at bedtime as needed. 08/17/22   Kuneff, Renee A, DO  Vitamin D, Ergocalciferol, (DRISDOL) 1.25 MG (50000 UNIT) CAPS  capsule Take 1 capsule (50,000 Units total) by mouth every 7 (seven) days. 07/22/22   Kuneff, Renee A, DO      Allergies    Strattera [atomoxetine], Adhesive [tape], and Wellbutrin [bupropion]    Review of Systems   Review of Systems  Physical Exam Updated Vital Signs BP (!) 157/87   Pulse 71   Temp 98.5 F (36.9 C) (Oral)   Resp 18   Ht 6\' 1"  (1.854 m)   Wt (!) 200 kg   SpO2 98%   BMI 58.17 kg/m  Physical Exam Vitals and nursing note reviewed.  Constitutional:      General: He is not in acute distress.    Appearance: He is well-developed.  HENT:     Head: Normocephalic and atraumatic.     Right Ear: External ear normal.     Left Ear: External ear normal.     Nose: Nose normal.  Eyes:     Extraocular Movements: Extraocular movements intact.     Conjunctiva/sclera: Conjunctivae normal.     Pupils: Pupils are equal, round, and reactive to light.  Cardiovascular:     Rate and Rhythm: Normal rate and regular rhythm.     Heart sounds: Normal heart sounds.  Pulmonary:     Effort: Pulmonary effort is normal. No respiratory distress.     Breath sounds: Normal breath sounds.  Musculoskeletal:     Cervical back: Normal range of motion and neck supple.     Right lower leg: No edema.  Left lower leg: No edema.  Skin:    General: Skin is warm and dry.  Neurological:     Mental Status: He is alert.     Comments: NIHSS Exam  Level of Consciousness: Alert  LOC Questions: Answers Month and Age Correctly  LOC Commands: Opens and Closes Eyes and Hands on command  Best Gaze: Horizontal ocular movements intact  Visual Fields: No visual field loss  Facial Palsy: None  L Upper Extremity Motor: No drift after 10 seconds  R Upper Extremity Motor: Slight drift L Lower extremity Motor: No drift after 5 seconds  R Lower extremity Motor: Slight drift Ataxia: Absent  Sensory: Intact sensation to light touch on face, arms, trunk, and legs bilaterally  Best Language: No aphasia   Dysarthria: No dysarthria  Neglect: No visual or sensory neglect    Psychiatric:        Mood and Affect: Mood normal.        Behavior: Behavior normal.     ED Results / Procedures / Treatments   Labs (all labs ordered are listed, but only abnormal results are displayed) Labs Reviewed  CBC - Abnormal; Notable for the following components:      Result Value   Hemoglobin 12.7 (*)    HCT 38.7 (*)    All other components within normal limits  DIFFERENTIAL  ETHANOL  COMPREHENSIVE METABOLIC PANEL  RAPID URINE DRUG SCREEN, HOSP PERFORMED  URINALYSIS, ROUTINE W REFLEX MICROSCOPIC    EKG None  Radiology No results found.  Procedures Procedures  {Document cardiac monitor, telemetry assessment procedure when appropriate:1}  Medications Ordered in ED Medications - No data to display  ED Course/ Medical Decision Making/ A&P   {   Click here for ABCD2, HEART and other calculatorsREFRESH Note before signing :1}                              Medical Decision Making Amount and/or Complexity of Data Reviewed Labs: ordered. Radiology: ordered.  Risk Prescription drug management.   ***  {Document critical care time when appropriate:1} {Document review of labs and clinical decision tools ie heart score, Chads2Vasc2 etc:1}  {Document your independent review of radiology images, and any outside records:1} {Document your discussion with family members, caretakers, and with consultants:1} {Document social determinants of health affecting pt's care:1} {Document your decision making why or why not admission, treatments were needed:1} Final Clinical Impression(s) / ED Diagnoses Final diagnoses:  None    Rx / DC Orders ED Discharge Orders     None

## 2023-01-12 NOTE — ED Triage Notes (Signed)
POV from home, A&O x 4, GCS 15, amb to room  2 days of headache and fatigue, dizziness.   2-3 hours ago right sided heaviness after waking from nap  Hx of TBI and migraines. Doesn't feel like normal migraines.

## 2023-01-12 NOTE — ED Notes (Signed)
ED Provider at bedside. PA bedside.

## 2023-01-12 NOTE — ED Notes (Signed)
Pt to Ct

## 2023-01-12 NOTE — ED Notes (Signed)
PA to bedside for evalulation

## 2023-01-13 ENCOUNTER — Emergency Department (HOSPITAL_COMMUNITY): Payer: BC Managed Care – PPO

## 2023-01-13 DIAGNOSIS — R531 Weakness: Secondary | ICD-10-CM | POA: Diagnosis not present

## 2023-01-13 DIAGNOSIS — R519 Headache, unspecified: Secondary | ICD-10-CM | POA: Diagnosis present

## 2023-01-13 DIAGNOSIS — R42 Dizziness and giddiness: Secondary | ICD-10-CM | POA: Diagnosis not present

## 2023-01-13 NOTE — ED Notes (Signed)
Pt arrived to ED 32 as transfer from [facility].  Pt endorses headache and dizziness; states he has been feeling weakness to right side.  Pt able to ambulate to stretcher.

## 2023-01-13 NOTE — ED Provider Notes (Signed)
Patient transferred from outside facility for brain MRI.  Describes severe right-sided headache 2 days ago with associated right-sided weakness and numbness.  CT head was negative for hemorrhage or aneurysm.  MRI negative for infarct.  Discussed with Dr. Derry Lory of neurology.  He agrees likely complex migraine with patient stable for outpatient follow-up.  On reassessment, patient's right-sided weakness has improved and his headache has resolved.  Patient comfortable with outpatient neurology follow-up.  Return precautions discussed   Glynn Octave, MD 01/13/23 [phone]

## 2023-01-13 NOTE — ED Notes (Signed)
Pt taken to MRI  

## 2023-01-13 NOTE — Discharge Instructions (Signed)
Your testing shows no evidence of stroke.  We favor migraine headache causing your symptoms.  MRI is negative.  CT is negative for aneurysm.  Follow-up with a neurologist for further evaluation.  Return to the ED with new or worsening symptoms.

## 2023-01-13 NOTE — ED Notes (Signed)
Pt provided with AVS.  Education complete; all questions answered.  Pt leaving ED in stable condition at this time, ambulatory with all belongings. 

## 2023-01-25 ENCOUNTER — Ambulatory Visit: Payer: BC Managed Care – PPO | Admitting: Family Medicine

## 2023-01-25 ENCOUNTER — Other Ambulatory Visit (HOSPITAL_COMMUNITY): Payer: Self-pay

## 2023-01-25 ENCOUNTER — Encounter: Payer: Self-pay | Admitting: Family Medicine

## 2023-01-25 VITALS — BP 124/81 | HR 60 | Temp 98.2°F | Wt >= 6400 oz

## 2023-01-25 DIAGNOSIS — F418 Other specified anxiety disorders: Secondary | ICD-10-CM | POA: Diagnosis not present

## 2023-01-25 DIAGNOSIS — S069X9S Unspecified intracranial injury with loss of consciousness of unspecified duration, sequela: Secondary | ICD-10-CM

## 2023-01-25 DIAGNOSIS — F902 Attention-deficit hyperactivity disorder, combined type: Secondary | ICD-10-CM | POA: Diagnosis not present

## 2023-01-25 MED ORDER — CITALOPRAM HYDROBROMIDE 40 MG PO TABS: ORAL_TABLET | ORAL | 1 refills | Status: DC

## 2023-01-25 MED ORDER — LISDEXAMFETAMINE DIMESYLATE 40 MG PO CAPS
40.0000 mg | ORAL_CAPSULE | ORAL | 0 refills | Status: DC
Start: 2023-04-15 — End: 2023-07-12

## 2023-01-25 MED ORDER — LISDEXAMFETAMINE DIMESYLATE 40 MG PO CAPS
40.0000 mg | ORAL_CAPSULE | ORAL | 0 refills | Status: DC
Start: 2023-01-25 — End: 2023-07-12
  Filled 2023-01-25: qty 90, 90d supply, fill #0
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
## 2023-01-25 NOTE — Patient Instructions (Addendum)

## 2023-02-08 ENCOUNTER — Encounter: Payer: Self-pay | Admitting: Psychiatry

## 2023-02-08 ENCOUNTER — Telehealth: Payer: Self-pay | Admitting: Psychiatry

## 2023-02-08 ENCOUNTER — Ambulatory Visit: Payer: BC Managed Care – PPO | Admitting: Psychiatry

## 2023-02-08 VITALS — BP 154/104 | HR 68 | Ht 72.0 in | Wt >= 6400 oz

## 2023-02-08 DIAGNOSIS — G43109 Migraine with aura, not intractable, without status migrainosus: Secondary | ICD-10-CM

## 2023-02-08 MED ORDER — MECLIZINE HCL 12.5 MG PO TABS: 12.5000 mg | ORAL_TABLET | Freq: Three times a day (TID) | ORAL | 3 refills | Status: DC | PRN

## 2023-02-08 MED ORDER — UBRELVY 100 MG PO TABS: 100.0000 mg | ORAL_TABLET | ORAL | 6 refills | Status: DC | PRN

## 2023-02-08 NOTE — Telephone Encounter (Signed)
Pt is requesting a work note from 01/11/23 - 02/11/23

## 2023-02-08 NOTE — Telephone Encounter (Signed)
Note sent via mychart
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
## 2023-02-22 ENCOUNTER — Telehealth: Payer: Self-pay

## 2023-02-22 NOTE — Telephone Encounter (Signed)
*  [facility]  Pharmacy Patient Advocate Encounter   Received notification from CoverMyMeds that prior authorization for Ubrelvy 100MG  tablets  is required/requested.   Insurance verification completed.   The patient is insured through [facility] .   Per test claim: PA required; PA submitted to [facility] via CoverMyMeds Key/confirmation #[id number] [facility] Status is pending

## 2023-02-23 ENCOUNTER — Other Ambulatory Visit (HOSPITAL_COMMUNITY): Payer: Self-pay

## 2023-02-23 NOTE — Telephone Encounter (Signed)
Pharmacy Patient Advocate Encounter  Received notification from [facility] that Prior Authorization for Ubrelvy 100MG  tablets has been APPROVED from 02/22/2023 to 02/21/2024. Ran test claim, Copay is $0. This test claim was processed through [facility]- copay amounts may vary at other pharmacies due to pharmacy/plan contracts, or as the patient moves through the different stages of their insurance plan.   PA #/Case ID/Reference #: PA Case ID #: [id number]

## 2023-03-03 ENCOUNTER — Telehealth: Payer: Self-pay

## 2023-03-03 NOTE — Telephone Encounter (Signed)
Received form from [employer], received 02/25/23. Reached out to patient regarding form fee and payment information was given to billing. Form brought to POD 1 for completion

## 2023-03-07 DIAGNOSIS — Z0289 Encounter for other administrative examinations: Secondary | ICD-10-CM

## 2023-03-07 NOTE — Telephone Encounter (Signed)
[employer] form started, then to Dr. Delena Bali for completion and signature.

## 2023-03-08 NOTE — Telephone Encounter (Signed)
Completed, signed and to medical records.

## 2023-03-09 NOTE — Telephone Encounter (Signed)
Form faxed and confirmation fax received. Copy mailed to patient per patient request. Original and copy given to medical records

## 2023-05-27 HISTORY — PX: PANNICULECTOMY: SUR1001

## 2023-05-27 HISTORY — PX: SKIN GRAFT: SHX250

## 2023-05-27 HISTORY — PX: SCROTOPLASTY: SHX489

## 2023-05-27 HISTORY — PX: OTHER SURGICAL HISTORY: SHX169

## 2023-06-02 ENCOUNTER — Encounter: Payer: Self-pay | Admitting: Family Medicine

## 2023-07-12 ENCOUNTER — Encounter: Payer: Self-pay | Admitting: Family Medicine

## 2023-07-12 ENCOUNTER — Ambulatory Visit: Payer: Self-pay | Admitting: Family Medicine

## 2023-07-12 VITALS — BP 132/82 | HR 80 | Temp 98.2°F | Wt 364.2 lb

## 2023-07-12 DIAGNOSIS — E538 Deficiency of other specified B group vitamins: Secondary | ICD-10-CM

## 2023-07-12 DIAGNOSIS — F418 Other specified anxiety disorders: Secondary | ICD-10-CM

## 2023-07-12 DIAGNOSIS — E559 Vitamin D deficiency, unspecified: Secondary | ICD-10-CM

## 2023-07-12 DIAGNOSIS — S069X9D Unspecified intracranial injury with loss of consciousness of unspecified duration, subsequent encounter: Secondary | ICD-10-CM

## 2023-07-12 DIAGNOSIS — S069X9S Unspecified intracranial injury with loss of consciousness of unspecified duration, sequela: Secondary | ICD-10-CM

## 2023-07-12 DIAGNOSIS — E782 Mixed hyperlipidemia: Secondary | ICD-10-CM

## 2023-07-12 DIAGNOSIS — F902 Attention-deficit hyperactivity disorder, combined type: Secondary | ICD-10-CM

## 2023-07-12 MED ORDER — LISDEXAMFETAMINE DIMESYLATE 20 MG PO CAPS: 40.0000 mg | ORAL_CAPSULE | ORAL | 0 refills | Status: AC

## 2023-07-12 MED ORDER — LISDEXAMFETAMINE DIMESYLATE 20 MG PO CAPS: 20.0000 mg | ORAL_CAPSULE | ORAL | 0 refills | Status: DC

## 2023-07-12 MED ORDER — ROSUVASTATIN CALCIUM 10 MG PO TABS: 10.0000 mg | ORAL_TABLET | Freq: Every evening | ORAL | 3 refills | Status: DC | PRN
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
## 2023-07-12 NOTE — Patient Instructions (Addendum)

## 2023-07-27 ENCOUNTER — Telehealth: Payer: Self-pay

## 2023-07-27 NOTE — Telephone Encounter (Signed)
Reason for CRM: Patient called to inform the provider that his insurance is sending a prior authorization to the provider for the RX: lisdexamfetamine (VYVANSE) 20 MG capsule prescription.   PA needed.

## 2023-08-01 ENCOUNTER — Telehealth (HOSPITAL_COMMUNITY): Payer: Self-pay | Admitting: Pharmacy Technician

## 2023-08-01 ENCOUNTER — Other Ambulatory Visit (HOSPITAL_COMMUNITY): Payer: Self-pay

## 2023-08-01 NOTE — Telephone Encounter (Signed)
Pharmacy Patient Advocate Encounter   Received notification from  [employer]  that prior authorization for LISDEXAMFETAMINE DIMESYLATE 20MG  CAPSULES  is required/requested.   Insurance verification completed.   The patient is insured through [facility] .   Per test claim: PA required; PA submitted to above mentioned insurance via CoverMyMeds Key/confirmation #/EOC [id number] Status is pending

## 2023-08-02 ENCOUNTER — Other Ambulatory Visit (HOSPITAL_COMMUNITY): Payer: Self-pay

## 2023-08-02 NOTE — Telephone Encounter (Signed)
Pharmacy Patient Advocate Encounter  Received notification from [facility] that Prior Authorization for LISDEXAMFETAMINE DIMESYLATE 20MG  CAPSULES has been APPROVED from 08/01/2023 to 07/31/2026. Ran test claim, Copay is $90.00. This test claim was processed through [facility]- copay amounts may vary at other pharmacies due to pharmacy/plan contracts, or as the patient moves through the different stages of their insurance plan.   PA #/Case ID/Reference #: [id number]

## 2023-08-03 NOTE — Telephone Encounter (Signed)
PA request has been Approved. New Encounter created for follow up. For additional info see Pharmacy Prior Auth telephone encounter from 08/01/23.
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
## 2023-08-24 ENCOUNTER — Ambulatory Visit: Payer: BC Managed Care – PPO | Admitting: Neurology

## 2023-08-24 ENCOUNTER — Encounter: Payer: Self-pay | Admitting: Neurology

## 2023-08-24 VITALS — BP 147/87 | HR 85 | Ht 73.0 in | Wt 352.0 lb

## 2023-08-24 DIAGNOSIS — R42 Dizziness and giddiness: Secondary | ICD-10-CM | POA: Insufficient documentation

## 2023-08-24 DIAGNOSIS — G43109 Migraine with aura, not intractable, without status migrainosus: Secondary | ICD-10-CM | POA: Diagnosis not present

## 2023-08-24 MED ORDER — ONDANSETRON 4 MG PO TBDP: 4.0000 mg | ORAL_TABLET | Freq: Three times a day (TID) | ORAL | 1 refills | Status: AC | PRN

## 2023-08-24 MED ORDER — MECLIZINE HCL 12.5 MG PO TABS: 12.5000 mg | ORAL_TABLET | Freq: Three times a day (TID) | ORAL | 3 refills | Status: AC | PRN

## 2023-08-24 MED ORDER — UBRELVY 100 MG PO TABS: 100.0000 mg | ORAL_TABLET | ORAL | 6 refills | Status: AC | PRN

## 2023-08-24 NOTE — Patient Instructions (Addendum)
 Can continue current medications at onset of vertigo, take Ubrelvy, try zofran for nausea, add in meclizine as needed.

## 2023-09-20 ENCOUNTER — Emergency Department (HOSPITAL_COMMUNITY)

## 2023-09-20 ENCOUNTER — Emergency Department (HOSPITAL_COMMUNITY)
Admission: EM | Admit: 2023-09-20 | Discharge: 2023-09-20 | Disposition: A | Discharge status: Home / Self Care | Attending: Emergency Medicine | Admitting: Emergency Medicine

## 2023-09-20 ENCOUNTER — Encounter (HOSPITAL_COMMUNITY): Payer: Self-pay

## 2023-09-20 ENCOUNTER — Other Ambulatory Visit: Payer: Self-pay

## 2023-09-20 DIAGNOSIS — R079 Chest pain, unspecified: Secondary | ICD-10-CM

## 2023-09-20 DIAGNOSIS — G43809 Other migraine, not intractable, without status migrainosus: Secondary | ICD-10-CM | POA: Diagnosis not present

## 2023-09-20 DIAGNOSIS — R42 Dizziness and giddiness: Secondary | ICD-10-CM | POA: Diagnosis present

## 2023-09-20 LAB — COMPREHENSIVE METABOLIC PANEL WITH GFR
ALT: 15 U/L (ref 0–44)
AST: 17 U/L (ref 15–41)
Albumin: 3.9 g/dL (ref 3.5–5.0)
Alkaline Phosphatase: 53 U/L (ref 38–126)
Anion gap: 8 (ref 5–15)
BUN: 11 mg/dL (ref 6–20)
CO2: 25 mmol/L (ref 22–32)
Calcium: 9.1 mg/dL (ref 8.9–10.3)
Chloride: 106 mmol/L (ref 98–111)
Creatinine, Ser: 1 mg/dL (ref 0.61–1.24)
GFR, Estimated: >60 mL/min (ref >60)
Glucose, Bld: 96 mg/dL (ref 70–99)
Potassium: 4.2 mmol/L (ref 3.5–5.1)
Sodium: 139 mmol/L (ref 135–145)
Total Bilirubin: 1.1 mg/dL (ref 0.0–1.2)
Total Protein: 7.2 g/dL (ref 6.5–8.1)

## 2023-09-20 LAB — DIFFERENTIAL
Abs Immature Granulocytes: 0.02 10*3/uL (ref 0.00–0.07)
Basophils Absolute: 0 10*3/uL (ref 0.0–0.1)
Basophils Relative: 1 %
Eosinophils Absolute: 0.1 10*3/uL (ref 0.0–0.5)
Eosinophils Relative: 2 %
Immature Granulocytes: 0 %
Lymphocytes Relative: 29 %
Lymphs Abs: 2.3 10*3/uL (ref 0.7–4.0)
Monocytes Absolute: 0.5 10*3/uL (ref 0.1–1.0)
Monocytes Relative: 6 %
Neutro Abs: 5 10*3/uL (ref 1.7–7.7)
Neutrophils Relative %: 62 %

## 2023-09-20 LAB — I-STAT CHEM 8, ED
BUN: 12 mg/dL (ref 6–20)
Calcium, Ion: 1.17 mmol/L (ref 1.15–1.40)
Chloride: 106 mmol/L (ref 98–111)
Creatinine, Ser: 1.1 mg/dL (ref 0.61–1.24)
Glucose, Bld: 96 mg/dL (ref 70–99)
HCT: 39 % (ref 39.0–52.0)
Hemoglobin: 13.3 g/dL (ref 13.0–17.0)
Potassium: 4.3 mmol/L (ref 3.5–5.1)
Sodium: 141 mmol/L (ref 135–145)
TCO2: 24 mmol/L (ref 22–32)

## 2023-09-20 LAB — TROPONIN I (HIGH SENSITIVITY)
Troponin I (High Sensitivity): 7 ng/L (ref <18)
Troponin I (High Sensitivity): 8 ng/L (ref <18)

## 2023-09-20 LAB — URINALYSIS, ROUTINE W REFLEX MICROSCOPIC
Bilirubin Urine: NEGATIVE
Glucose, UA: NEGATIVE mg/dL
Hgb urine dipstick: NEGATIVE
Ketones, ur: NEGATIVE mg/dL
Leukocytes,Ua: NEGATIVE
Nitrite: NEGATIVE
Protein, ur: NEGATIVE mg/dL
Specific Gravity, Urine: 1.021 (ref 1.005–1.030)
pH: 5 (ref 5.0–8.0)

## 2023-09-20 LAB — RAPID URINE DRUG SCREEN, HOSP PERFORMED
Amphetamines: POSITIVE — AB
Barbiturates: NOT DETECTED
Benzodiazepines: NOT DETECTED
Cocaine: NOT DETECTED
Opiates: NOT DETECTED
Tetrahydrocannabinol: NOT DETECTED

## 2023-09-20 LAB — CBC
HCT: 41 % (ref 39.0–52.0)
Hemoglobin: 12.8 g/dL — ABNORMAL LOW (ref 13.0–17.0)
MCH: 26.9 pg (ref 26.0–34.0)
MCHC: 31.2 g/dL (ref 30.0–36.0)
MCV: 86.3 fL (ref 80.0–100.0)
Platelets: 322 10*3/uL (ref 150–400)
RBC: 4.75 MIL/uL (ref 4.22–5.81)
RDW: 14.8 % (ref 11.5–15.5)
WBC: 8 10*3/uL (ref 4.0–10.5)
nRBC: 0 % (ref 0.0–0.2)

## 2023-09-20 LAB — PROTIME-INR
INR: 1 (ref 0.8–1.2)
Prothrombin Time: 13.7 s (ref 11.4–15.2)

## 2023-09-20 LAB — CBG MONITORING, ED: Glucose-Capillary: 90 mg/dL (ref 70–99)

## 2023-09-20 LAB — APTT: aPTT: 35 s (ref 24–36)

## 2023-09-20 LAB — ETHANOL: Alcohol, Ethyl (B): <10 mg/dL (ref <10)

## 2023-09-20 MED ORDER — FENTANYL CITRATE PF 50 MCG/ML IJ SOSY
50.0000 ug | PREFILLED_SYRINGE | Freq: Once | INTRAMUSCULAR | Status: DC
  Filled 2023-09-20: qty 1

## 2023-09-20 MED ORDER — MECLIZINE HCL 25 MG PO TABS
25.0000 mg | ORAL_TABLET | Freq: Once | ORAL | Status: AC
  Administered 2023-09-20: 25 mg via ORAL
  Filled 2023-09-20: qty 1

## 2023-09-20 MED ORDER — SODIUM CHLORIDE 0.9 % IV BOLUS
1000.0000 mL | Freq: Once | INTRAVENOUS | Status: AC
  Administered 2023-09-20: 1000 mL via INTRAVENOUS

## 2023-09-20 MED ORDER — DIPHENHYDRAMINE HCL 50 MG/ML IJ SOLN
25.0000 mg | Freq: Once | INTRAMUSCULAR | Status: AC
  Administered 2023-09-20: 25 mg via INTRAVENOUS
  Filled 2023-09-20: qty 1

## 2023-09-20 MED ORDER — METOCLOPRAMIDE HCL 5 MG/ML IJ SOLN
10.0000 mg | Freq: Once | INTRAMUSCULAR | Status: AC
  Administered 2023-09-20: 10 mg via INTRAVENOUS
  Filled 2023-09-20: qty 2

## 2023-09-20 MED ORDER — DEXAMETHASONE SODIUM PHOSPHATE 10 MG/ML IJ SOLN
10.0000 mg | Freq: Once | INTRAMUSCULAR | Status: AC
  Administered 2023-09-20: 10 mg via INTRAVENOUS
  Filled 2023-09-20: qty 1

## 2023-09-20 NOTE — Discharge Instructions (Addendum)
 As we discussed, your CT head is normal today and your heart enzymes are normal today   Please follow-up with your doctor.  You also should follow-up with your neurologist  Stay hydrated  Return to ER if you have severe headache or vomiting or hallucinations

## 2023-09-20 NOTE — ED Triage Notes (Addendum)
 Pt c/o migraine and vertigo started at 0630 today. Pt states his migraine was painful and it's not usually painful, he just get pressure behind his eyes. Pt c/o midsternal chest pain that radiates to left shoulder started today. Pt c/o having trouble with getting his thoughts together today. Pt's LKW 2200 yesterday.

## 2023-09-20 NOTE — ED Provider Notes (Signed)
  EMERGENCY DEPARTMENT AT [facility] Provider Note   CSN: [id number] Arrival date & time: 09/20/23  1603     History  Chief Complaint  Patient presents with   Migraine    Bobby [name] is a 48 y.o. male history of traumatic brain injury, subdural hemorrhage, migraines and vertigo here presenting with worsening migraines and vertigo and visual hallucinations.  Patient states that he woke up today and had acute onset of headache.  He states that he usually has headache but this is worse than usual.  He states that he felt dizzy and the room was spinning.  He also states that he ended up texting his boss but did not remember anything.  He also imagine that he was somewhere else while he was in the waiting room.  Patient states that he took his ubrelvy for headache and did not help.   The history is provided by the patient.       Home Medications Prior to Admission medications   Medication Sig Start Date End Date Taking? Authorizing Provider  citalopram (CELEXA) 40 MG tablet TAKE 1 TABLET BY MOUTH ONCE DAILY Patient not taking: Reported on 08/24/2023 01/25/23   Felix Pacini A, DO  Ferrous Sulfate (IRON PO) Take by mouth.    [provider]  lisdexamfetamine (VYVANSE) 20 MG capsule Take 2 capsules (40 mg total) by mouth every morning. 09/26/23   Kuneff, Renee A, DO  meclizine (ANTIVERT) 12.5 MG tablet Take 1 tablet (12.5 mg total) by mouth 3 (three) times daily as needed for dizziness. 08/24/23   Glean Salvo, NP  Multiple Vitamin (MULTIVITAMIN) LIQD Take 5 mLs by mouth daily.    [provider]  ondansetron (ZOFRAN-ODT) 4 MG disintegrating tablet Take 1 tablet (4 mg total) by mouth every 8 (eight) hours as needed for nausea or vomiting. 08/24/23   Glean Salvo, NP  rosuvastatin (CRESTOR) 10 MG tablet Take 1 tablet (10 mg total) by mouth at bedtime as needed. Patient not taking: Reported on 08/24/2023 07/12/23   Felix Pacini A, DO  Ubrogepant  (UBRELVY) 100 MG TABS Take 1 tablet (100 mg total) by mouth as needed (migraine). May repeat a dose in 2 hours if needed. Max dose 2 pills in 24 hours 08/24/23   Glean Salvo, NP  ursodiol (ACTIGALL) 300 MG capsule Take 300 mg by mouth 2 (two) times daily. Patient not taking: Reported on 08/24/2023 04/29/23   [provider]      Allergies    Strattera [atomoxetine], Adhesive [tape], and Wellbutrin [bupropion]    Review of Systems   Review of Systems  Neurological:  Positive for dizziness and headaches.  All other systems reviewed and are negative.   Physical Exam Updated Vital Signs BP (!) 146/91   Pulse (!) 58   Temp 98.4 F (36.9 C) (Oral)   Resp 17   Ht 6\' 1"  (1.854 m)   Wt (!) 159.7 kg   SpO2 98%   BMI 46.45 kg/m  Physical Exam Vitals and nursing note reviewed.  Constitutional:      Comments: Slightly uncomfortable  HENT:     Head: Normocephalic.     Nose: Nose normal.     Mouth/Throat:     Mouth: Mucous membranes are moist.  Eyes:     Extraocular Movements: Extraocular movements intact.     Pupils: Pupils are equal, round, and reactive to light.     Comments: No obvious nystagmus  Cardiovascular:  Rate and Rhythm: Normal rate and regular rhythm.     Pulses: Normal pulses.     Heart sounds: Normal heart sounds.  Pulmonary:     Effort: Pulmonary effort is normal.     Breath sounds: Normal breath sounds.  Abdominal:     General: Abdomen is flat.     Palpations: Abdomen is soft.  Musculoskeletal:        General: Normal range of motion.     Cervical back: Normal range of motion and neck supple.  Skin:    General: Skin is warm.     Capillary Refill: Capillary refill takes less than 2 seconds.  Neurological:     General: No focal deficit present.     Mental Status: He is oriented to person, place, and time.     Comments: Cranial nerve II to XII intact.  Patient has normal finger-to-nose bilaterally.  Patient has normal strength bilaterally.  Normal  gait.  Psychiatric:        Mood and Affect: Mood normal.        Behavior: Behavior normal.     ED Results / Procedures / Treatments   Labs (all labs ordered are listed, but only abnormal results are displayed) Labs Reviewed  CBC - Abnormal; Notable for the following components:      Result Value   Hemoglobin 12.8 (*)    All other components within normal limits  RAPID URINE DRUG SCREEN, HOSP PERFORMED - Abnormal; Notable for the following components:   Amphetamines POSITIVE (*)    All other components within normal limits  URINALYSIS, ROUTINE W REFLEX MICROSCOPIC - Abnormal; Notable for the following components:   APPearance HAZY (*)    All other components within normal limits  ETHANOL  PROTIME-INR  APTT  DIFFERENTIAL  COMPREHENSIVE METABOLIC PANEL WITH GFR  I-STAT CHEM 8, ED  CBG MONITORING, ED  TROPONIN I (HIGH SENSITIVITY)  TROPONIN I (HIGH SENSITIVITY)    EKG EKG Interpretation Date/Time:  Tuesday September 20 2023 16:13:49 EDT Ventricular Rate:  63 PR Interval:  158 QRS Duration:  96 QT Interval:  392 QTC Calculation: 401 R Axis:   28  Text Interpretation: Normal sinus rhythm Normal ECG When compared with ECG of 12-Jan-2023 22:12, PREVIOUS ECG IS PRESENT Confirmed by Anders Simmonds [phone]) on 09/20/2023 4:23:14 PM  Radiology No results found.  Procedures Procedures    Medications Ordered in ED Medications  metoCLOPramide (REGLAN) injection 10 mg (has no administration in time range)  diphenhydrAMINE (BENADRYL) injection 25 mg (has no administration in time range)  sodium chloride 0.9 % bolus 1,000 mL (has no administration in time range)  dexamethasone (DECADRON) injection 10 mg (has no administration in time range)  meclizine (ANTIVERT) tablet 25 mg (has no administration in time range)  fentaNYL (SUBLIMAZE) injection 50 mcg (has no administration in time range)    ED Course/ Medical Decision Making/ A&P                                 Medical  Decision Making Bobby [name] is a 48 y.o. male here presenting with headache.  Acute onset of headache.  Patient does have a history of migraines and previous subdural hemorrhage.  I have low suspicion for subarachnoid hemorrhage and patient presented within 6 hours of onset of headache so Noncon CT head is sufficient to rule out subarachnoid. Plan to get labs and CT head and give migraine cocktail  and reassess.  9:04 PM Reviewed patient's labs and troponin negative x 2.  CT head unremarkable.  Patient is feeling better after migraine cocktail.  Stable for discharge  Problems Addressed: Chest pain, unspecified type: acute illness or injury Other migraine without status migrainosus, not intractable: acute illness or injury  Amount and/or Complexity of Data Reviewed Labs: ordered. Decision-making details documented in ED Course. Radiology: ordered and independent interpretation performed. Decision-making details documented in ED Course.  Risk Prescription drug management.    Final Clinical Impression(s) / ED Diagnoses Final diagnoses:  None    Rx / DC Orders ED Discharge Orders     None         Charlynne Pander, MD 09/20/23 2107

## 2023-09-21 ENCOUNTER — Encounter: Payer: Self-pay | Admitting: Neurology

## 2023-09-22 ENCOUNTER — Ambulatory Visit: Admitting: Neurology

## 2023-09-22 ENCOUNTER — Encounter: Payer: Self-pay | Admitting: Neurology

## 2023-09-22 VITALS — BP 148/88 | HR 70 | Ht 73.0 in

## 2023-09-22 DIAGNOSIS — S069X9S Unspecified intracranial injury with loss of consciousness of unspecified duration, sequela: Secondary | ICD-10-CM

## 2023-09-22 DIAGNOSIS — G43109 Migraine with aura, not intractable, without status migrainosus: Secondary | ICD-10-CM

## 2023-09-22 DIAGNOSIS — F418 Other specified anxiety disorders: Secondary | ICD-10-CM

## 2023-09-22 DIAGNOSIS — R42 Dizziness and giddiness: Secondary | ICD-10-CM | POA: Diagnosis not present

## 2023-09-22 MED ORDER — VENLAFAXINE HCL ER 37.5 MG PO CP24: ORAL_CAPSULE | ORAL | 3 refills | Status: DC
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
## 2023-09-22 NOTE — Patient Instructions (Signed)
 Start Effexor XR 37.5 mg at bedtime for 2 weeks, then take 2 tablets at bedtime for migraine prevention   Check MRI brain IAC with and without contrast   Referral to Vestibular Rehab Physical Therapy  Keep [place] posted.  Follow up in 4-6 months with Dr. Lucia Gaskins

## 2023-09-23 ENCOUNTER — Encounter: Payer: Self-pay | Admitting: Physical Therapy

## 2023-09-23 ENCOUNTER — Telehealth: Payer: Self-pay | Admitting: Neurology

## 2023-09-23 ENCOUNTER — Ambulatory Visit: Attending: Neurology | Admitting: Physical Therapy

## 2023-09-23 VITALS — BP 110/79 | HR 55

## 2023-09-23 DIAGNOSIS — R2681 Unsteadiness on feet: Secondary | ICD-10-CM | POA: Diagnosis present

## 2023-09-23 DIAGNOSIS — G43109 Migraine with aura, not intractable, without status migrainosus: Secondary | ICD-10-CM | POA: Diagnosis not present

## 2023-09-23 DIAGNOSIS — R42 Dizziness and giddiness: Secondary | ICD-10-CM | POA: Diagnosis present

## 2023-09-23 NOTE — Telephone Encounter (Signed)
 no auth required sent to GI due to his weight. [phone]

## 2023-09-26 ENCOUNTER — Ambulatory Visit: Admitting: Physical Therapy

## 2023-09-28 ENCOUNTER — Other Ambulatory Visit: Payer: Self-pay

## 2023-09-28 ENCOUNTER — Emergency Department (HOSPITAL_BASED_OUTPATIENT_CLINIC_OR_DEPARTMENT_OTHER)
Admission: EM | Admit: 2023-09-28 | Discharge: 2023-09-28 | Disposition: A | Discharge status: Home / Self Care | Attending: Emergency Medicine | Admitting: Emergency Medicine

## 2023-09-28 ENCOUNTER — Emergency Department (HOSPITAL_BASED_OUTPATIENT_CLINIC_OR_DEPARTMENT_OTHER)

## 2023-09-28 ENCOUNTER — Encounter (HOSPITAL_BASED_OUTPATIENT_CLINIC_OR_DEPARTMENT_OTHER): Payer: Self-pay

## 2023-09-28 DIAGNOSIS — K8020 Calculus of gallbladder without cholecystitis without obstruction: Secondary | ICD-10-CM | POA: Diagnosis not present

## 2023-09-28 DIAGNOSIS — D72829 Elevated white blood cell count, unspecified: Secondary | ICD-10-CM | POA: Insufficient documentation

## 2023-09-28 DIAGNOSIS — R1010 Upper abdominal pain, unspecified: Secondary | ICD-10-CM | POA: Diagnosis present

## 2023-09-28 LAB — COMPREHENSIVE METABOLIC PANEL WITH GFR
ALT: 11 U/L (ref 0–44)
AST: 14 U/L — ABNORMAL LOW (ref 15–41)
Albumin: 4.2 g/dL (ref 3.5–5.0)
Alkaline Phosphatase: 65 U/L (ref 38–126)
Anion gap: 9 (ref 5–15)
BUN: 13 mg/dL (ref 6–20)
CO2: 24 mmol/L (ref 22–32)
Calcium: 9.3 mg/dL (ref 8.9–10.3)
Chloride: 107 mmol/L (ref 98–111)
Creatinine, Ser: 0.89 mg/dL (ref 0.61–1.24)
GFR, Estimated: >60 mL/min (ref >60)
Glucose, Bld: 112 mg/dL — ABNORMAL HIGH (ref 70–99)
Potassium: 3.9 mmol/L (ref 3.5–5.1)
Sodium: 140 mmol/L (ref 135–145)
Total Bilirubin: 0.6 mg/dL (ref 0.0–1.2)
Total Protein: 7.6 g/dL (ref 6.5–8.1)

## 2023-09-28 LAB — CBC
HCT: 40.4 % (ref 39.0–52.0)
Hemoglobin: 13.2 g/dL (ref 13.0–17.0)
MCH: 27.7 pg (ref 26.0–34.0)
MCHC: 32.7 g/dL (ref 30.0–36.0)
MCV: 84.7 fL (ref 80.0–100.0)
Platelets: 296 10*3/uL (ref 150–400)
RBC: 4.77 MIL/uL (ref 4.22–5.81)
RDW: 14.8 % (ref 11.5–15.5)
WBC: 17.6 10*3/uL — ABNORMAL HIGH (ref 4.0–10.5)
nRBC: 0 % (ref 0.0–0.2)

## 2023-09-28 LAB — URINALYSIS, ROUTINE W REFLEX MICROSCOPIC
Bacteria, UA: NONE SEEN
Bilirubin Urine: NEGATIVE
Glucose, UA: NEGATIVE mg/dL
Hgb urine dipstick: NEGATIVE
Ketones, ur: NEGATIVE mg/dL
Leukocytes,Ua: NEGATIVE
Nitrite: NEGATIVE
Protein, ur: 30 mg/dL — AB
Specific Gravity, Urine: >1.046 — ABNORMAL HIGH (ref 1.005–1.030)
pH: 6 (ref 5.0–8.0)

## 2023-09-28 LAB — LIPASE, BLOOD: Lipase: 10 U/L — ABNORMAL LOW (ref 11–51)

## 2023-09-28 MED ORDER — ONDANSETRON HCL 4 MG PO TABS: 4.0000 mg | ORAL_TABLET | Freq: Four times a day (QID) | ORAL | 0 refills | Status: AC

## 2023-09-28 MED ORDER — ONDANSETRON 4 MG PO TBDP
8.0000 mg | ORAL_TABLET | Freq: Once | ORAL | Status: AC
  Administered 2023-09-28: 8 mg via ORAL
  Filled 2023-09-28: qty 2

## 2023-09-28 MED ORDER — FAMOTIDINE IN NACL 20-0.9 MG/50ML-% IV SOLN
20.0000 mg | Freq: Once | INTRAVENOUS | Status: AC
  Administered 2023-09-28: 20 mg via INTRAVENOUS
  Filled 2023-09-28: qty 50

## 2023-09-28 MED ORDER — HYDROMORPHONE HCL 1 MG/ML IJ SOLN
1.0000 mg | Freq: Once | INTRAMUSCULAR | Status: DC
  Filled 2023-09-28: qty 1

## 2023-09-28 MED ORDER — IOHEXOL 300 MG/ML  SOLN
100.0000 mL | Freq: Once | INTRAMUSCULAR | Status: AC | PRN
  Administered 2023-09-28: 100 mL via INTRAVENOUS

## 2023-09-28 NOTE — ED Notes (Signed)
 [facility] is sending secure chat to Dr. Clerance Dais RN for consult arrangement per physician is currently in surgery.

## 2023-09-28 NOTE — ED Provider Notes (Signed)
 [place] EMERGENCY DEPARTMENT AT [facility] Provider Note   CSN: [id number] Arrival date & time: 09/28/23  0700     History  Chief Complaint  Patient presents with   Abdominal Pain    Bobby [name] is a 48 y.o. male.  HPI    48 year old male comes in with chief complaint of abdominal pain.  Patient has previous history of gastric sleeve procedure which was performed on 10-24.  He comes to the ER because of upper quadrant abdominal pain.  He indicates that the pain started last night around 7 PM.  Pain is in the upper quadrants only, there is constant pain with waxing and waning intensity.  Patient had single episode of nausea with emesis.  He has had 4-5 loose bowel movements.  Patient denies any blood in the stool or vomit.  He has no history of kidney stones.  No UTI-like symptoms.  Home Medications Prior to Admission medications   Medication Sig Start Date End Date Taking? Authorizing Provider  ondansetron (ZOFRAN) 4 MG tablet Take 1 tablet (4 mg total) by mouth every 6 (six) hours. 09/28/23  Yes Derwood Kaplan, MD  lisdexamfetamine (VYVANSE) 20 MG capsule Take 2 capsules (40 mg total) by mouth every morning. 09/26/23   Kuneff, Renee A, DO  meclizine (ANTIVERT) 12.5 MG tablet Take 1 tablet (12.5 mg total) by mouth 3 (three) times daily as needed for dizziness. 08/24/23   Glean Salvo, NP  Multiple Vitamin (MULTIVITAMIN) LIQD Take 5 mLs by mouth daily.    [provider]  ondansetron (ZOFRAN-ODT) 4 MG disintegrating tablet Take 1 tablet (4 mg total) by mouth every 8 (eight) hours as needed for nausea or vomiting. 08/24/23   Glean Salvo, NP  Ubrogepant (UBRELVY) 100 MG TABS Take 1 tablet (100 mg total) by mouth as needed (migraine). May repeat a dose in 2 hours if needed. Max dose 2 pills in 24 hours 08/24/23   Glean Salvo, NP  venlafaxine XR (EFFEXOR XR) 37.5 MG 24 hr capsule Take 1 capsule at bedtime for 2 weeks, then take 2 09/22/23   Glean Salvo, NP       Allergies    Strattera [atomoxetine], Adhesive [tape], and Wellbutrin [bupropion]    Review of Systems   Review of Systems  All other systems reviewed and are negative.   Physical Exam Updated Vital Signs BP 128/70   Pulse 60   Temp 98.2 F (36.8 C) (Oral)   Resp 20   Ht 6\' 1"  (1.854 m)   Wt (!) 153.8 kg   SpO2 100%   BMI 44.73 kg/m  Physical Exam Vitals and nursing note reviewed.  Constitutional:      Appearance: He is well-developed.  HENT:     Head: Atraumatic.  Cardiovascular:     Rate and Rhythm: Normal rate.  Pulmonary:     Effort: Pulmonary effort is normal.  Abdominal:     Tenderness: There is abdominal tenderness in the right upper quadrant and epigastric area. Negative signs include Murphy's sign and Rovsing's sign.  Musculoskeletal:     Cervical back: Neck supple.  Skin:    General: Skin is warm.  Neurological:     Mental Status: He is alert and oriented to person, place, and time.     ED Results / Procedures / Treatments   Labs (all labs ordered are listed, but only abnormal results are displayed) Labs Reviewed  LIPASE, BLOOD - Abnormal; Notable for the following components:  Result Value   Lipase 10 (*)    All other components within normal limits  COMPREHENSIVE METABOLIC PANEL WITH GFR - Abnormal; Notable for the following components:   Glucose, Bld 112 (*)    AST 14 (*)    All other components within normal limits  CBC - Abnormal; Notable for the following components:   WBC 17.6 (*)    All other components within normal limits  URINALYSIS, ROUTINE W REFLEX MICROSCOPIC - Abnormal; Notable for the following components:   Specific Gravity, Urine >1.046 (*)    Protein, ur 30 (*)    All other components within normal limits    EKG None  Radiology CT ABDOMEN PELVIS W CONTRAST Result Date: 09/28/2023 CLINICAL DATA:  Upper abdominal pain, vomiting, history of gastric sleeve. EXAM: CT ABDOMEN AND PELVIS WITH CONTRAST TECHNIQUE:  Multidetector CT imaging of the abdomen and pelvis was performed using the standard protocol following bolus administration of intravenous contrast. RADIATION DOSE REDUCTION: This exam was performed according to the departmental dose-optimization program which includes automated exposure control, adjustment of the mA and/or kV according to patient size and/or use of iterative reconstruction technique. CONTRAST:  OMNIPAQUE IOHEXOL 300 MG/ML  SOLN COMPARISON:  September 15, 2017 FINDINGS: Lower chest: No focal airspace consolidation or pleural effusion. Hepatobiliary: No mass.No radiopaque stones or wall thickening of the gallbladder.No intrahepatic or extrahepatic biliary ductal dilation.The portal veins are patent. Pancreas: No mass or main ductal dilation.No peripancreatic inflammation or fluid collection. Spleen: Normal size. No mass. Adrenals/Urinary Tract: No adrenal masses. No renal mass. No nephrolithiasis or hydronephrosis. The urinary bladder is distended without focal abnormality. Stomach/Bowel: Sleeve gastrectomy. Small diverticulum arising from the third portion the duodenum. Mild fluid-filled distension of segments of small bowel in the proximal abdomen with mild mesenteric edema. No bowel wall thickening or abrupt transition point to suggest bowel obstruction. The appendix was not visualized. No right lower quadrant or pericecal inflammatory changes to suggest acute appendicitis. Descending and sigmoid colonic diverticulosis. No changes of acute diverticulitis. Vascular/Lymphatic: No aortic aneurysm. No intraabdominal or pelvic lymphadenopathy. Reproductive: No prostatomegaly.Small volume free fluid in the pelvis. Other: No pneumoperitoneum, ascites, or mesenteric inflammation. Musculoskeletal: No acute fracture or destructive lesion.Mild degenerative disc disease at L5-S1. IMPRESSION: 1. Fluid-filled distension of the proximal small bowel with mild mesenteric edema, which may reflect changes of a  nonspecific enteritis. No abrupt transition point to suggest small bowel obstruction. 2. Small volume free fluid in the pelvis, likely reactive.  For Electronically Signed   By: Wallie Char M.D.   On: 09/28/2023 10:42   US Abdomen Limited RUQ (LIVER/GB) Result Date: 09/28/2023 CLINICAL DATA:  Right upper quadrant abdominal pain EXAM: ULTRASOUND ABDOMEN LIMITED RIGHT UPPER QUADRANT COMPARISON:  Abdominal CT 09/15/2017 FINDINGS: Gallbladder: Shadowing stone at the gallbladder neck. Full but not over distended gallbladder with no wall thickening or focal tenderness. Common bile duct: Diameter: 6 mm Liver: No focal lesion identified. Within normal limits in parenchymal echogenicity. Portal vein is patent on color Doppler imaging with normal direction of blood flow towards the liver. IMPRESSION: Cholelithiasis without evidence of cholecystitis. Electronically Signed   By: Tiburcio Pea M.D.   On: 09/28/2023 08:31    Procedures Procedures    Medications Ordered in ED Medications  HYDROmorphone (DILAUDID) injection 1 mg (1 mg Intravenous Not Given 09/28/23 0813)  ondansetron (ZOFRAN-ODT) disintegrating tablet 8 mg (8 mg Oral Given 09/28/23 0813)  famotidine (PEPCID) IVPB 20 mg premix (0 mg Intravenous Stopped 09/28/23 0910)  iohexol (  OMNIPAQUE) 300 MG/ML solution 100 mL (100 mLs Intravenous Contrast Given 09/28/23 0920)    ED Course/ Medical Decision Making/ A&P                                 Medical Decision Making Amount and/or Complexity of Data Reviewed Labs: ordered. Radiology: ordered.  Risk Prescription drug management.   This patient presents to the ED with chief complaint(s) of sudden onset abdominal pain with pertinent past medical history of gastric sleeve procedure, phimosis repair.The complaint involves an extensive differential diagnosis and also carries with it a high risk of complications and morbidity.    The differential diagnosis includes : Small bowel obstruction,  cholecystitis, pancreatitis, cholelithiasis, gastritis. Low suspicion for viral illness or food poisoning.  The initial plan is to get basic labs, ultrasound abdomen right upper quadrant.   Additional history obtained: Records reviewed Care Everywhere/External Records.  Reviewed surgical notes from October.  Independent labs interpretation:  The following labs were independently interpreted: CBC, CMP is overall reassuring.  White count is slightly elevated, likely acute marker for stress.  No clinical suspicion for sepsis.  Independent visualization and interpretation of imaging: - I independently visualized the following imaging with scope of interpretation limited to determining acute life threatening conditions related to emergency care: Ultrasound right upper quadrant, which revealed evidence of gallstone.  No significant pericholecystic fluid.  Per radiologist, no evidence of acute cholecystitis.  CT abdomen pelvis has been ordered.  Treatment and Reassessment: Patient reassessed after the ultrasound.  Continues to have pain, but the pain is improved.  Abdominal exam still negative for Murphy sign, with patient had moderate abdominal discomfort.  Patient ranged his pain at 8 out of 10 upon arrival.  On reassessment the pain has improved.  He can he needs to have loose bowel movements. We will consult general surgery.  Consultation: - Consulted or discussed management/test interpretation with external professional: Case discussed with Dr. Carolynne Edouard, general surgery. He recommends that we speak with [facility] general surgeons given patient had bariatric procedure done there in October.  I spoke with Misty Stanley, who works with Dr. Yetta Flock.  She has discussed the CT and ultrasound findings with Dr. Yetta Flock.  The images have been power shared with [facility] as well.  According to Dr. Yetta Flock, patient can be seen by [facility] outpatient general surgery for gallstones.  They will put in a referral.  Patient reassessed  again at 11:45 AM.  Results of the ED workup discussed with him.  Surgery consultation findings also discussed with him. He states that the now the pain is 2-4 out of 10.  He feels a lot better.  He does not want any narcotic medicine.  He is comfortable with outpatient follow-up.  Return precautions discussed.   Final Clinical Impression(s) / ED Diagnoses Final diagnoses:  Calculus of gallbladder without cholecystitis without obstruction    Rx / DC Orders ED Discharge Orders          Ordered    ondansetron (ZOFRAN) 4 MG tablet  Every 6 hours        09/28/23 1153              Derwood Kaplan, MD 09/28/23 1158

## 2023-09-28 NOTE — ED Triage Notes (Signed)
 Patient arrives with complaints of increased abdominal pain that started last night. Patient reports "waves" of intermittent pain and rates his pain a 8/10. Vomiting prior to arrival.  Hx of gastric sleeve surgery.

## 2023-09-28 NOTE — Discharge Instructions (Addendum)
 The workup in the emergency room is concerning for gallstones.  CT scan is not showing any evidence of obstructions or direct complication from the gastric sleeve.  We have called [facility] and [place] with Dr. Arlena Lacrosse team.  They will put in a referral for general surgery at [facility] for your gallstones.  Follow the gallbladder eating diet for now.  Return to the ER if you start having severe nausea, vomiting, abdominal pain, fevers, chills.

## 2023-10-03 ENCOUNTER — Ambulatory Visit: Admitting: Physical Therapy

## 2023-10-03 ENCOUNTER — Encounter: Payer: Self-pay | Admitting: Physical Therapy

## 2023-10-03 DIAGNOSIS — R42 Dizziness and giddiness: Secondary | ICD-10-CM | POA: Diagnosis not present

## 2023-10-03 DIAGNOSIS — R2681 Unsteadiness on feet: Secondary | ICD-10-CM
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
## 2023-10-03 NOTE — Patient Instructions (Signed)
 Gaze Stabilization: Standing Feet Apart    Feet shoulder width apart, keeping eyes on target on wall __a few__ feet away, tilt head down 15-30 and move head side to side for __30__ seconds. Repeat while moving head up and down for __30__ seconds.  Perform 3 sets of each  Do ___1-2_ sessions per day.   Gaze Stabilization: Tip Card  1.Target must remain in focus, not blurry, and appear stationary while head is in motion. 2.Perform exercises with small head movements (45 to either side of midline). 3.Increase speed of head motion so long as target is in focus. 4.If you wear eyeglasses, be sure you can see target through lens (therapist will give specific instructions for bifocal / progressive lenses). 5.These exercises may provoke dizziness or nausea. Work through these symptoms. If too dizzy, slow head movement slightly. Rest between each exercise. 6.Exercises demand concentration; avoid distractions. 7.For safety, perform standing exercises close to a counter, wall, corner, or next to someone.

## 2023-10-06 ENCOUNTER — Encounter: Payer: Self-pay | Admitting: Neurology

## 2023-10-10 ENCOUNTER — Ambulatory Visit: Admitting: Physical Therapy

## 2023-10-17 ENCOUNTER — Encounter: Payer: Self-pay | Admitting: Physical Therapy

## 2023-10-17 ENCOUNTER — Ambulatory Visit: Attending: Neurology | Admitting: Physical Therapy

## 2023-10-17 VITALS — BP 119/85 | HR 56

## 2023-10-17 DIAGNOSIS — R2681 Unsteadiness on feet: Secondary | ICD-10-CM

## 2023-10-17 DIAGNOSIS — R42 Dizziness and giddiness: Secondary | ICD-10-CM | POA: Diagnosis present
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
## 2023-10-18 ENCOUNTER — Ambulatory Visit
Admission: RE | Admit: 2023-10-18 | Discharge: 2023-10-18 | Disposition: A | Discharge status: Home / Self Care | Source: Ambulatory Visit | Attending: Neurology | Admitting: Neurology

## 2023-10-18 DIAGNOSIS — G43109 Migraine with aura, not intractable, without status migrainosus: Secondary | ICD-10-CM

## 2023-10-18 DIAGNOSIS — R42 Dizziness and giddiness: Secondary | ICD-10-CM

## 2023-10-18 MED ORDER — GADOPICLENOL 0.5 MMOL/ML IV SOLN: 10.0000 mL | Freq: Once | INTRAVENOUS | Status: DC | PRN

## 2023-10-31 ENCOUNTER — Ambulatory Visit
Admission: RE | Admit: 2023-10-31 | Discharge: 2023-10-31 | Disposition: A | Discharge status: Home / Self Care | Source: Ambulatory Visit | Attending: Neurology | Admitting: Neurology

## 2023-10-31 ENCOUNTER — Ambulatory Visit: Payer: Self-pay | Admitting: Neurology

## 2023-10-31 DIAGNOSIS — G43109 Migraine with aura, not intractable, without status migrainosus: Secondary | ICD-10-CM | POA: Diagnosis not present

## 2023-10-31 MED ORDER — GADOPICLENOL 0.5 MMOL/ML IV SOLN
10.0000 mL | Freq: Once | INTRAVENOUS | Status: AC | PRN
  Administered 2023-10-31: 10 mL via INTRAVENOUS

## 2023-11-07 ENCOUNTER — Telehealth: Admitting: Physician Assistant

## 2023-11-07 DIAGNOSIS — H109 Unspecified conjunctivitis: Secondary | ICD-10-CM

## 2023-11-07 MED ORDER — MOXIFLOXACIN HCL 0.5 % OP SOLN: 1.0000 [drp] | Freq: Three times a day (TID) | OPHTHALMIC | 0 refills | Status: AC

## 2023-11-07 NOTE — Progress Notes (Signed)
 Virtual Visit Consent   [name], you are scheduled for a virtual visit with a [facility] provider today. Just as with appointments in the office, your consent must be obtained to participate. Your consent will be active for this visit and any virtual visit you may have with one of our providers in the next 365 days. If you have a MyChart account, a copy of this consent can be sent to you electronically.  As this is a virtual visit, video technology does not allow for your provider to perform a traditional examination. This may limit your provider's ability to fully assess your condition. If your provider identifies any concerns that need to be evaluated in person or the need to arrange testing (such as labs, EKG, etc.), we will make arrangements to do so. Although advances in technology are sophisticated, we cannot ensure that it will always work on either your end or our end. If the connection with a video visit is poor, the visit may have to be switched to a telephone visit. With either a video or telephone visit, we are not always able to ensure that we have a secure connection.  By engaging in this virtual visit, you consent to the provision of healthcare and authorize for your insurance to be billed (if applicable) for the services provided during this visit. Depending on your insurance coverage, you may receive a charge related to this service.  I need to obtain your verbal consent now. Are you willing to proceed with your visit today? BRANSEN FASSNACHT has provided verbal consent on 11/07/2023 for a virtual visit (video or telephone). Angelia Kelp, PA-C  Date: 11/07/2023 2:29 PM   Virtual Visit via Video Note   I, Angelia Kelp, connected with  Bobby [name]  ([id number], [birth year]) on 11/07/23 at  2:15 PM EDT by a video-enabled telemedicine application and verified that I am speaking with the correct person using two identifiers.  Location: Patient: Virtual Visit  Location Patient: Home Provider: Virtual Visit Location Provider: Home Office   I discussed the limitations of evaluation and management by telemedicine and the availability of in person appointments. The patient expressed understanding and agreed to proceed.    History of Present Illness: Bobby [name] is a 48 y.o. who identifies as a male who was assigned male at birth, and is being seen today for possible pink eye.  HPI: Conjunctivitis  The current episode started yesterday. The onset was sudden. The problem has been gradually worsening. The problem is mild. Nothing relieves the symptoms. Nothing aggravates the symptoms. Associated symptoms include decreased vision, congestion, eye discharge, eye pain (mild) and eye redness. Pertinent negatives include no fever, no double vision, no eye itching and no photophobia. Associated symptoms comments: Foreign body sensation. The eye pain is mild. Both eyes are affected. The eye pain is not associated with movement. The eyelid exhibits no abnormality.     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   Lumbar radiculopathy 11/25/2014   B12 deficiency 04/13/2011   Acquired hypothyroidism 12/28/2010   Hyperlipidemia 12/28/2010   Morbid obesity 07/08/2010   Gastroesophageal reflux  disease 07/08/2010   Obstructive sleep apnea 07/08/2010   ADHD (attention deficit hyperactivity disorder)     Allergies:  Allergies  Allergen Reactions   Strattera [Atomoxetine]     Urinary difficulties   Adhesive [Tape] Itching and Rash    Paper tape, orange tape and tegaderm okay per  patient   Wellbutrin  Eudes.Fent ] Other (See Comments)    Urethral symptoms   Medications:  Current Outpatient Medications:    moxifloxacin (VIGAMOX) 0.5 % ophthalmic solution, Place 1 drop into both eyes 3 (three) times daily for 5 days., Disp: 3 mL, Rfl: 0   lisdexamfetamine (VYVANSE ) 20 MG capsule, Take 2 capsules (40 mg total) by mouth every morning., Disp: 90 capsule, Rfl: 0   meclizine  (ANTIVERT ) 12.5 MG tablet, Take 1 tablet (12.5 mg total) by mouth 3 (three) times daily as needed for dizziness., Disp: 30 tablet, Rfl: 3   Multiple Vitamin (MULTIVITAMIN) LIQD, Take 5 mLs by mouth daily., Disp: , Rfl:    ondansetron  (ZOFRAN ) 4 MG tablet, Take 1 tablet (4 mg total) by mouth every 6 (six) hours., Disp: 12 tablet, Rfl: 0   ondansetron  (ZOFRAN -ODT) 4 MG disintegrating tablet, Take 1 tablet (4 mg total) by mouth every 8 (eight) hours as needed for nausea or vomiting., Disp: 20 tablet, Rfl: 1   Ubrogepant  (UBRELVY ) 100 MG TABS, Take 1 tablet (100 mg total) by mouth as needed (migraine). May repeat a dose in 2 hours if needed. Max dose 2 pills in 24 hours, Disp: 16 tablet, Rfl: 6   venlafaxine  XR (EFFEXOR  XR) 37.5 MG 24 hr capsule, Take 1 capsule at bedtime for 2 weeks, then take 2, Disp: 60 capsule, Rfl: 3  Observations/Objective: Patient is well-developed, well-nourished in no acute distress.  Resting comfortably at home.  Head is normocephalic, atraumatic.  No labored breathing.  Speech is clear and coherent with logical content.  Patient is alert and oriented at baseline.    Assessment and Plan: 1. Bacterial conjunctivitis (Primary) - moxifloxacin (VIGAMOX) 0.5 % ophthalmic solution; Place 1 drop into both eyes 3 (three) times daily for 5 days.  Dispense: 3 mL; Refill: 0  - Suspect bacterial conjunctivitis - Moxifloxacin eye drops prescribed - Warm compresses - Good hand hygiene - Seek in person evaluation if symptoms worsen or fail to improve   Follow Up Instructions: I  discussed the assessment and treatment plan with the patient. The patient was provided an opportunity to ask questions and all were answered. The patient agreed with the plan and demonstrated an understanding of the instructions.  A copy of instructions were sent to the patient via MyChart unless otherwise noted below.    The patient was advised to call back or seek an in-person evaluation if the symptoms worsen or if the condition fails to improve as anticipated.    Angelia Kelp, PA-C

## 2023-11-07 NOTE — Patient Instructions (Signed)
 Alden Humphrey, thank you for joining Angelia Kelp, PA-C for today's virtual visit.  While this provider is not your primary care provider (PCP), if your PCP is located in our provider database this encounter information will be shared with them immediately following your visit.   A [facility] MyChart account gives you access to today's visit and all your visits, tests, and labs performed at [facility] " click here if you don't have a [facility] MyChart account or go to [email or web address]  Consent: (Patient) Alden Humphrey provided verbal consent for this virtual visit at the beginning of the encounter.  Current Medications:  Current Outpatient Medications:    moxifloxacin (VIGAMOX) 0.5 % ophthalmic solution, Place 1 drop into both eyes 3 (three) times daily for 5 days., Disp: 3 mL, Rfl: 0   lisdexamfetamine (VYVANSE ) 20 MG capsule, Take 2 capsules (40 mg total) by mouth every morning., Disp: 90 capsule, Rfl: 0   meclizine  (ANTIVERT ) 12.5 MG tablet, Take 1 tablet (12.5 mg total) by mouth 3 (three) times daily as needed for dizziness., Disp: 30 tablet, Rfl: 3   Multiple Vitamin (MULTIVITAMIN) LIQD, Take 5 mLs by mouth daily., Disp: , Rfl:    ondansetron  (ZOFRAN ) 4 MG tablet, Take 1 tablet (4 mg total) by mouth every 6 (six) hours., Disp: 12 tablet, Rfl: 0   ondansetron  (ZOFRAN -ODT) 4 MG disintegrating tablet, Take 1 tablet (4 mg total) by mouth every 8 (eight) hours as needed for nausea or vomiting., Disp: 20 tablet, Rfl: 1   Ubrogepant  (UBRELVY ) 100 MG TABS, Take 1 tablet (100 mg total) by mouth as needed (migraine). May repeat a dose in 2 hours if needed. Max dose 2 pills in 24 hours, Disp: 16 tablet, Rfl: 6   venlafaxine  XR (EFFEXOR  XR) 37.5 MG 24 hr capsule, Take 1 capsule at bedtime for 2 weeks, then take 2, Disp: 60 capsule, Rfl: 3   Medications ordered in this encounter:  Meds ordered this encounter  Medications   moxifloxacin (VIGAMOX) 0.5 % ophthalmic  solution    Sig: Place 1 drop into both eyes 3 (three) times daily for 5 days.    Dispense:  3 mL    Refill:  0    Supervising Provider:   Corine Dice [[id number]]     *If you need refills on other medications prior to your next appointment, please contact your pharmacy*  Follow-Up: Call back or seek an in-person evaluation if the symptoms worsen or if the condition fails to improve as anticipated.  [facility] [phone]  Other Instructions Bacterial Conjunctivitis, Adult Bacterial conjunctivitis is an infection of the clear membrane that covers the white part of the eye and the inner surface of the eyelid (conjunctiva). When the blood vessels in the conjunctiva become inflamed, the eye becomes red or pink. The eye often feels irritated or itchy. Bacterial conjunctivitis spreads easily from person to person (is contagious). It also spreads easily from one eye to the other eye. What are the causes? This condition is caused by bacteria. You may get the infection if you come into close contact with: A person who is infected with the bacteria. Items that are contaminated with the bacteria, such as a face towel, contact lens solution, or eye makeup. What increases the risk? You are more likely to develop this condition if: You are exposed to other people who have the infection. You wear contact lenses. You have a sinus infection. You have had a recent eye  injury or surgery. You have a weak body defense system (immune system). You have a medical condition that causes dry eyes. What are the signs or symptoms? Symptoms of this condition include: Thick, yellowish discharge from the eye. This may turn into a crust on the eyelid overnight and cause your eyelids to stick together. Tearing or watery eyes. Itchy eyes. Burning feeling in your eyes. Eye redness. Swollen eyelids. Blurred vision. How is this diagnosed? This condition is diagnosed based on your symptoms and  medical history. Your health care provider may also take a sample of discharge from your eye to find the cause of your infection. How is this treated? This condition may be treated with: Antibiotic eye drops or ointment to clear the infection more quickly and prevent the spread of infection to others. Antibiotic medicines taken by mouth (orally) to treat infections that do not respond to drops or ointments or that last longer than 10 days. Cool, wet cloths (cool compresses) placed on the eyes. Artificial tears applied 2-6 times a day. Follow these instructions at home: Medicines Take or apply your antibiotic medicine as told by your health care provider. Do not stop using the antibiotic, even if your condition improves, unless directed by your health care provider. Take or apply over-the-counter and prescription medicines only as told by your health care provider. Be very careful to avoid touching the edge of your eyelid with the eye-drop bottle or the ointment tube when you apply medicines to the affected eye. This will keep you from spreading the infection to your other eye or to other people. Managing discomfort Gently wipe away any drainage from your eye with a warm, wet washcloth or a cotton ball. Apply a clean, cool compress to your eye for 10-20 minutes, 3-4 times a day. General instructions Do not wear contact lenses until the inflammation is gone and your health care provider says it is safe to wear them again. Ask your health care provider how to sterilize or replace your contact lenses before you use them again. Wear glasses until you can resume wearing contact lenses. Avoid wearing eye makeup until the inflammation is gone. Throw away any old eye cosmetics that may be contaminated. Change or wash your pillowcase every day. Do not share towels or washcloths. This may spread the infection. Wash your hands often with soap and water for at least 20 seconds and especially before touching  your face or eyes. Use paper towels to dry your hands. Avoid touching or rubbing your eyes. Do not drive or use heavy machinery if your vision is blurred. Contact a health care provider if: You have a fever. Your symptoms do not get better after 10 days. Get help right away if: You have a fever and your symptoms suddenly get worse. You have severe pain when you move your eye. You have facial pain, redness, or swelling. You have a sudden loss of vision. Summary Bacterial conjunctivitis is an infection of the clear membrane that covers the white part of the eye and the inner surface of the eyelid (conjunctiva). Bacterial conjunctivitis spreads easily from eye to eye and from person to person (is contagious). Wash your hands often with soap and water for at least 20 seconds and especially before touching your face or eyes. Use paper towels to dry your hands. Take or apply your antibiotic medicine as told by your health care provider. Do not stop using the antibiotic even if your condition improves. Contact a health care provider if you  have a fever or if your symptoms do not get better after 10 days. Get help right away if you have a sudden loss of vision. This information is not intended to replace advice given to you by your health care provider. Make sure you discuss any questions you have with your health care provider. Document Revised: 09/10/2020 Document Reviewed: 09/10/2020 Elsevier Patient Education  2024 Elsevier [employer]   If you have been instructed to have an in-person evaluation today at a local Urgent Care facility, please use the link below. It will take you to a list of all of our available [facility], including address, phone number and hours of operation. Please do not delay care.  [facility]  If you or a family member do not have a primary care provider, use the link below to schedule a visit and establish care. When you choose a [facility] primary  care physician or advanced practice provider, you gain a long-term partner in health. Find a Primary Care Provider  Learn more about [facility]'s in-office and virtual care options: [facility] - Get Care Now

## 2024-01-10 ENCOUNTER — Ambulatory Visit: Admitting: Family Medicine

## 2024-01-12 ENCOUNTER — Telehealth: Payer: Self-pay

## 2024-01-12 NOTE — Telephone Encounter (Signed)
 No further action needed at this time.

## 2024-01-12 NOTE — Telephone Encounter (Signed)
 Patient (has to work tomorrow - he said he called yesterday and spoke to [place] at [facility], she was to sched pt for today 7/31, error was on 8/1 schedule for provider. He asked who Suzen was,I told pt that we dont have a Suzen at this [employer]) Patient will call back to reschedule appt.  Prescription Request  01/12/2024  LOV: Visit date not found  What is the name of the medication or equipment?  lisdexamfetamine (VYVANSE ) 20 MG capsule  Have you contacted your pharmacy to request a refill? No   Which pharmacy would you like this sent to?  [id number] - [place], [place] - [address] [place]  [address] Phone: [phone] Fax: [phone]    Patient notified that their request is being sent to the clinical staff for review and that they should receive a response within 2 business days.   Please advise at Mobile [phone] (mobile)

## 2024-01-13 ENCOUNTER — Ambulatory Visit: Admitting: Family Medicine

## 2024-02-27 ENCOUNTER — Telehealth: Payer: Self-pay | Admitting: Neurology

## 2024-02-27 NOTE — Telephone Encounter (Signed)
 LVM and sent mychart msg informing pt of need to reschedule 02/28/24 appt - MD out

## 2024-02-28 ENCOUNTER — Ambulatory Visit: Admitting: Neurology

## 2024-03-06 ENCOUNTER — Telehealth: Payer: Self-pay

## 2024-03-06 ENCOUNTER — Other Ambulatory Visit (HOSPITAL_COMMUNITY): Payer: Self-pay

## 2024-03-06 NOTE — Telephone Encounter (Signed)
 Pharmacy Patient Advocate Encounter  Received notification from [facility] that Prior Authorization for Ubrelvy  has been APPROVED from 03/06/2024 to 03/06/2025   PA #/Case ID/Reference #: [id number]

## 2024-03-06 NOTE — Telephone Encounter (Signed)
 Pharmacy Patient Advocate Encounter   Received notification from CoverMyMeds that prior authorization for Ubrelvy  is required/requested.   Insurance verification completed.   The patient is insured through [facility] .   Per test claim: PA required; PA submitted to above mentioned insurance via Latent Key/confirmation #/EOC [id number] Status is pending

## 2024-03-14 ENCOUNTER — Telehealth: Payer: Self-pay | Admitting: Neurology

## 2024-03-14 NOTE — Telephone Encounter (Signed)
 LVM and sent mychart msg asking pt to call back to schedule appointment with another provider - MD leaving practice

## 2024-06-25 ENCOUNTER — Encounter: Payer: Self-pay | Admitting: Neurology

## 2024-06-25 MED ORDER — VENLAFAXINE HCL ER 37.5 MG PO CP24: 37.5000 mg | ORAL_CAPSULE | Freq: Every evening | ORAL | 3 refills | Status: AC

## 2024-08-23 ENCOUNTER — Ambulatory Visit: Admitting: Neurology
